# Patient Record
Sex: Male | Born: 1970 | Hispanic: Yes | Marital: Single | Smoking: Never smoker
Health system: Southern US, Community
[De-identification: ages and names within clinical notes are randomized; demographics above are authoritative.]

## PROBLEM LIST (undated history)

## (undated) DIAGNOSIS — F3189 Other bipolar disorder: Secondary | ICD-10-CM

## (undated) DIAGNOSIS — E119 Type 2 diabetes mellitus without complications: Secondary | ICD-10-CM

## (undated) DIAGNOSIS — E785 Hyperlipidemia, unspecified: Secondary | ICD-10-CM

## (undated) DIAGNOSIS — I10 Essential (primary) hypertension: Secondary | ICD-10-CM

## (undated) DIAGNOSIS — R569 Unspecified convulsions: Secondary | ICD-10-CM

## (undated) DIAGNOSIS — F319 Bipolar disorder, unspecified: Secondary | ICD-10-CM

## (undated) DIAGNOSIS — F23 Brief psychotic disorder: Secondary | ICD-10-CM

## (undated) HISTORY — PX: JOINT REPLACEMENT: SHX530

---

## 2017-04-13 ENCOUNTER — Emergency Department
Admission: EM | Admit: 2017-04-13 | Discharge: 2017-04-14 | Disposition: A | Discharge status: Home / Self Care | Payer: Medicare Other | Attending: Emergency Medicine | Admitting: Emergency Medicine

## 2017-04-13 DIAGNOSIS — F319 Bipolar disorder, unspecified: Secondary | ICD-10-CM | POA: Diagnosis not present

## 2017-04-13 DIAGNOSIS — R45851 Suicidal ideations: Secondary | ICD-10-CM

## 2017-04-13 DIAGNOSIS — Z79899 Other long term (current) drug therapy: Secondary | ICD-10-CM | POA: Diagnosis not present

## 2017-04-13 DIAGNOSIS — Z794 Long term (current) use of insulin: Secondary | ICD-10-CM | POA: Insufficient documentation

## 2017-04-13 DIAGNOSIS — E119 Type 2 diabetes mellitus without complications: Secondary | ICD-10-CM | POA: Insufficient documentation

## 2017-04-13 DIAGNOSIS — I10 Essential (primary) hypertension: Secondary | ICD-10-CM | POA: Diagnosis not present

## 2017-04-13 DIAGNOSIS — E871 Hypo-osmolality and hyponatremia: Secondary | ICD-10-CM | POA: Insufficient documentation

## 2017-04-13 HISTORY — DX: Essential (primary) hypertension: I10

## 2017-04-13 HISTORY — DX: Type 2 diabetes mellitus without complications: E11.9

## 2017-04-13 HISTORY — DX: Hyperlipidemia, unspecified: E78.5

## 2017-04-13 HISTORY — DX: Other bipolar disorder: F31.89

## 2017-04-13 HISTORY — DX: Brief psychotic disorder: F23

## 2017-04-13 HISTORY — DX: Bipolar disorder, unspecified: F31.9

## 2017-04-13 LAB — URINE DRUG SCREEN, QUALITATIVE (ARMC ONLY)
AMPHETAMINES, UR SCREEN: NOT DETECTED
BENZODIAZEPINE, UR SCRN: POSITIVE — AB
Barbiturates, Ur Screen: NOT DETECTED
CANNABINOID 50 NG, UR ~~LOC~~: NOT DETECTED
Cocaine Metabolite,Ur ~~LOC~~: NOT DETECTED
MDMA (ECSTASY) UR SCREEN: NOT DETECTED
Methadone Scn, Ur: NOT DETECTED
Opiate, Ur Screen: NOT DETECTED
PHENCYCLIDINE (PCP) UR S: NOT DETECTED
TRICYCLIC, UR SCREEN: NOT DETECTED

## 2017-04-13 LAB — CBC
HCT: 34.8 % — ABNORMAL LOW (ref 40.0–52.0)
Hemoglobin: 12.5 g/dL — ABNORMAL LOW (ref 13.0–18.0)
MCH: 30.4 pg (ref 26.0–34.0)
MCHC: 35.9 g/dL (ref 32.0–36.0)
MCV: 84.7 fL (ref 80.0–100.0)
PLATELETS: 171 10*3/uL (ref 150–440)
RBC: 4.11 MIL/uL — AB (ref 4.40–5.90)
RDW: 17 % — AB (ref 11.5–14.5)
WBC: 7.6 10*3/uL (ref 3.8–10.6)

## 2017-04-13 LAB — ACETAMINOPHEN LEVEL

## 2017-04-13 LAB — COMPREHENSIVE METABOLIC PANEL
ALT: 10 U/L — ABNORMAL LOW (ref 17–63)
ANION GAP: 9 (ref 5–15)
AST: 17 U/L (ref 15–41)
Albumin: 4.2 g/dL (ref 3.5–5.0)
Alkaline Phosphatase: 64 U/L (ref 38–126)
BILIRUBIN TOTAL: 0.5 mg/dL (ref 0.3–1.2)
BUN: 12 mg/dL (ref 6–20)
CHLORIDE: 95 mmol/L — AB (ref 101–111)
CO2: 24 mmol/L (ref 22–32)
Calcium: 9.5 mg/dL (ref 8.9–10.3)
Creatinine, Ser: 0.96 mg/dL (ref 0.61–1.24)
Glucose, Bld: 186 mg/dL — ABNORMAL HIGH (ref 65–99)
POTASSIUM: 4.3 mmol/L (ref 3.5–5.1)
Sodium: 128 mmol/L — ABNORMAL LOW (ref 135–145)
TOTAL PROTEIN: 8 g/dL (ref 6.5–8.1)

## 2017-04-13 LAB — SALICYLATE LEVEL

## 2017-04-13 LAB — ETHANOL: Alcohol, Ethyl (B): <5 mg/dL (ref <5)

## 2017-04-13 MED ORDER — BENZTROPINE MESYLATE 0.5 MG PO TABS
2.0000 mg | ORAL_TABLET | Freq: Two times a day (BID) | ORAL | Status: DC
  Administered 2017-04-13: 2 mg via ORAL
  Filled 2017-04-13: qty 4

## 2017-04-13 MED ORDER — DIVALPROEX SODIUM 500 MG PO DR TAB
2000.0000 mg | DELAYED_RELEASE_TABLET | Freq: Every day | ORAL | Status: DC
  Administered 2017-04-13: 2000 mg via ORAL
  Filled 2017-04-13: qty 4

## 2017-04-13 MED ORDER — AMLODIPINE BESYLATE 5 MG PO TABS: 10.0000 mg | ORAL_TABLET | Freq: Every day | ORAL | Status: DC

## 2017-04-13 MED ORDER — PROPRANOLOL HCL 20 MG PO TABS
10.0000 mg | ORAL_TABLET | Freq: Two times a day (BID) | ORAL | Status: DC
  Administered 2017-04-13: 10 mg via ORAL
  Filled 2017-04-13: qty 1

## 2017-04-13 MED ORDER — INSULIN DETEMIR 100 UNIT/ML ~~LOC~~ SOLN: 28.0000 [IU] | Freq: Every day | SUBCUTANEOUS | Status: DC

## 2017-04-13 MED ORDER — LORAZEPAM 1 MG PO TABS: 1.0000 mg | ORAL_TABLET | Freq: Every day | ORAL | Status: DC | PRN

## 2017-04-13 MED ORDER — LISINOPRIL 10 MG PO TABS: 20.0000 mg | ORAL_TABLET | Freq: Every day | ORAL | Status: DC

## 2017-04-13 MED ORDER — SODIUM CHLORIDE 0.9 % IV BOLUS (SEPSIS): 1000.0000 mL | Freq: Once | INTRAVENOUS | Status: DC

## 2017-04-13 MED ORDER — VITAMIN D 1000 UNITS PO TABS: 2000.0000 [IU] | ORAL_TABLET | Freq: Every day | ORAL | Status: DC

## 2017-04-13 MED ORDER — LORAZEPAM 2 MG PO TABS
2.0000 mg | ORAL_TABLET | Freq: Two times a day (BID) | ORAL | Status: DC
  Administered 2017-04-13: 2 mg via ORAL
  Filled 2017-04-13: qty 1

## 2017-04-13 MED ORDER — LACTULOSE 10 GM/15ML PO SOLN: 20.0000 g | Freq: Every day | ORAL | Status: DC

## 2017-04-13 MED ORDER — LEVETIRACETAM 500 MG PO TABS
1000.0000 mg | ORAL_TABLET | Freq: Every day | ORAL | Status: DC
  Administered 2017-04-13: 1000 mg via ORAL
  Filled 2017-04-13: qty 2

## 2017-04-13 MED ORDER — PANTOPRAZOLE SODIUM 40 MG PO TBEC: 40.0000 mg | DELAYED_RELEASE_TABLET | Freq: Every day | ORAL | Status: DC

## 2017-04-13 MED ORDER — ZIPRASIDONE HCL 80 MG PO CAPS: 80.0000 mg | ORAL_CAPSULE | Freq: Two times a day (BID) | ORAL | Status: DC

## 2017-04-13 NOTE — ED Triage Notes (Signed)
Patient c/o SI. Pt reports plan to hang himself with his belt. Pt reports hx of schizophrenia, bipolar.

## 2017-04-13 NOTE — ED Notes (Signed)
Thahn, ed tech sitting with pt at this time.

## 2017-04-13 NOTE — ED Notes (Signed)
Pt is refusing iv and ivf. Pt will not allow rn to touch arm. Dr. Shaune PollackLord notified, no new orders received.

## 2017-04-13 NOTE — BH Assessment (Signed)
Assessment Note  Oscar Duke is an 46 y.o. male, with a history of bipolar disorder and schizophrenia, presenting to the ED voluntarily with concerns of suicidal ideations.  Pt reports having thoughts of suicide because his caregiver said something to him about saying prayer over his food.  Pt reports he has always been taught to say his prayer over his food.  Pt currently denies SI while in the the ED.    Pt reports he was just transferred from a facility in [place] to [facility] in [place].  He states he does not like the facility because the people are mean and the facility is dirty.  He states he has "been in the system long enough to know what to say to get what he wants".  Pt denies SI/HI and any auditory/visual hallucinations.  He denies any drug/alcohol use.  Diagnosis: Schizophrenia  Past Medical History:  Past Medical History:  Diagnosis Date  . Bipolar 1 disorder (HCC)   . Diabetes mellitus without complication (HCC)   . Hyperlipidemia   . Hypertension   . 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 Schizophrenia, acute     Past Surgical History:  Procedure Laterality Date  . JOINT REPLACEMENT      Family History: No family history on file.  Social History:  reports that he has never smoked. He has never used smokeless tobacco. He reports that he uses drugs, including Marijuana. He reports that he does not drink alcohol.  Additional Social History:  Alcohol / Drug Use Pain Medications: See PTA Prescriptions: See PTA Over the Counter: See PTA History of alcohol / drug use?: No history of alcohol / drug abuse  CIWA: CIWA-Ar BP: (!) 155/90 Pulse Rate: 90 COWS:    Allergies:  Allergies  Allergen Reactions  . Haldol [Haloperidol Lactate]   . Inderal [Propranolol]   . Lithium     Home Medications:  (Not in a hospital admission)  OB/GYN Status:  No LMP for male patient.  General Assessment Data [facility]  Assessment: In system Is this a Tele or Face-to-Face Assessment?: Face-to-Face Is this an Initial Assessment or a Re-assessment for this encounter?: Initial Assessment Marital status: Single Maiden name: na Is patient pregnant?: No Pregnancy Status: No Living Arrangements: Group Home ([facility]) Can pt return to current living arrangement?: Yes Admission Status: Voluntary Is patient capable of signing voluntary admission?: Yes Referral Source: Self/Family/Friend Insurance type: Medicaid     Crisis Care Plan Living Arrangements: Group Home ([facility]) Legal Guardian: Other: (self) Name of Psychiatrist: [facility] Name of Therapist: [facility]  Education Status Is patient currently in school?: No Current Grade: na Highest grade of school patient has completed: na Name of school: na Contact person: na  Risk to self with the past 6 months Suicidal Ideation: Yes-Currently Present Has patient been a risk to self within the past 6 months prior to admission? : No Suicidal Intent: No Has patient had any suicidal intent within the past 6 months prior to admission? : No Is patient at risk for suicide?: No Suicidal Plan?: Yes-Currently Present Has patient had any suicidal plan within the past 6 months prior to admission? : No Specify Current Suicidal Plan: Pt reports thoughts of hanging self with a belt Access to Means: Yes Specify Access to Suicidal Means: Pt reports access to a belt What has been your use of drugs/alcohol within the last 12 months?: Pt denies drug/alcohol use Previous Attempts/Gestures: No Other Self  Harm Risks: none identified Triggers for Past Attempts: None known Intentional Self Injurious Behavior: None Family Suicide History: No Recent stressful life event(s): Other (Comment) (Pt does not like his group home) Persecutory voices/beliefs?: No Depression: No Substance abuse history and/or treatment for substance abuse?: No Suicide prevention information given  to non-admitted patients: Not applicable  Risk to Others within the past 6 months Homicidal Ideation: No Does patient have any lifetime risk of violence toward others beyond the six months prior to admission? : No Thoughts of Harm to Others: No Current Homicidal Intent: No Current Homicidal Plan: No Access to Homicidal Means: No Identified Victim: none identified History of harm to others?: No Assessment of Violence: None Noted Violent Behavior Description: none noted Does patient have access to weapons?: No Criminal Charges Pending?: No Does patient have a court date: No Is patient on probation?: No  Psychosis Hallucinations: None noted Delusions: None noted  Mental Status Report Appearance/Hygiene: In scrubs Eye Contact: Good Motor Activity: Freedom of movement Speech: Logical/coherent Level of Consciousness: Alert Mood: Pleasant Affect: Appropriate to circumstance Anxiety Level: None Thought Processes: Relevant Judgement: Unimpaired Orientation: Person, Place, Time, Situation Obsessive Compulsive Thoughts/Behaviors: Minimal  Cognitive Functioning Concentration: Good Memory: Recent Intact, Remote Intact IQ: Average Insight: Fair Impulse Control: Fair Appetite: Good Weight Loss: 0 Weight Gain: 0 Sleep: No Change Vegetative Symptoms: None  ADLScreening [facility] Assessment Services) Patient's cognitive ability adequate to safely complete daily activities?: Yes Patient able to express need for assistance with ADLs?: Yes Independently performs ADLs?: Yes (appropriate for developmental age)  Prior Inpatient Therapy Prior Inpatient Therapy: Yes Prior Therapy Dates: 03/2017 Prior Therapy Facilty/Provider(s): [facility] Reason for Treatment: schizophrenia  Prior Outpatient Therapy Prior Outpatient Therapy: No Prior Therapy Dates: na Prior Therapy Facilty/Provider(s): na Reason for Treatment: na Does patient have an ACCT team?: Yes Does patient have Intensive  In-House Services?  : No Does patient have [facility] services? : No Does patient have P4CC services?: No  ADL Screening (condition at time of admission) Patient's cognitive ability adequate to safely complete daily activities?: Yes Patient able to express need for assistance with ADLs?: Yes Independently performs ADLs?: Yes (appropriate for developmental age)       Abuse/Neglect Assessment (Assessment to be complete while patient is alone) Physical Abuse: Denies Verbal Abuse: Denies Sexual Abuse: Denies Exploitation of patient/patient's resources: Denies Self-Neglect: Denies Values / Beliefs Cultural Requests During Hospitalization: None Spiritual Requests During Hospitalization: None Consults Spiritual Care Consult Needed: No Social Work Consult Needed: No Merchant navy officer (For Healthcare) Does Patient Have a Medical Advance Directive?: No    Additional Information 1:1 In Past 12 Months?: No CIRT Risk: No Elopement Risk: No Does patient have medical clearance?: Yes     Disposition:  Disposition Initial Assessment Completed for this Encounter: Yes Disposition of Patient: Other dispositions Other disposition(s): Other (Comment) (Pending [facility] consult)  On Site Evaluation by:   Reviewed with Physician:    [place] 04/13/2017 10:35 PM

## 2017-04-13 NOTE — ED Notes (Signed)
TTS in room at this time interviewing.

## 2017-04-13 NOTE — ED Notes (Addendum)
Officer moses with [facility] sitting with pt as SI sitter at this time, relieving Thahn.

## 2017-04-13 NOTE — ED Provider Notes (Signed)
[facility] Emergency Department Provider Note ____________________________________________   I have reviewed the triage vital signs and the triage nursing note.  HISTORY  Chief Complaint Suicidal   Historian Patient  HPI Oscar Duke is a 46 y.o. male with a history of schizophrenia, manic affective disorder, bipolar listed under a diagnosis history, as well as diabetes and hypertension, presents today reporting suicidal ideation. States that he doesn't like where he is living at this group home. He is from [facility]. He is a group home here now. States that he is unhappy there. States that he had a plan to hang himself with a belt. He states told this to the triage nurse that he was still feeling actively suicidal.  To me he states that at the moment he is not feeling actively suicidal but he did have the thoughts earlier.    Past Medical History:  Diagnosis Date  . Bipolar 1 disorder (HCC)   . Diabetes mellitus without complication (HCC)   . Hyperlipidemia   . Hypertension   . 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 Schizophrenia, acute     There are no active problems to display for this patient.   Past Surgical History:  Procedure Laterality Date  . JOINT REPLACEMENT      Prior to Admission medications   Medication Sig Start Date End Date Taking? Authorizing Provider  amLODipine (NORVASC) 10 MG tablet Take 10 mg by mouth daily.   Yes [provider]  benztropine (COGENTIN) 2 MG tablet Take 2 mg by mouth 2 (two) times daily.   Yes [provider]  Cholecalciferol (VITAMIN D3) 2000 units TABS Take by mouth daily.   Yes [provider]  insulin detemir (LEVEMIR) 100 UNIT/ML injection Inject 28 Units into the skin daily.   Yes [provider]  Lactulose 20 GM/30ML SOLN Take 30 mLs by mouth daily.   Yes [provider]  lisinopril (PRINIVIL,ZESTRIL) 20 MG tablet Take 20 mg by  mouth daily.   Yes [provider]  LORazepam (ATIVAN) 2 MG tablet Take 2 mg by mouth 2 (two) times daily.   Yes [provider]  omeprazole (PRILOSEC) 20 MG capsule Take 20 mg by mouth daily.   Yes [provider]  propranolol (INDERAL) 10 MG tablet Take 10 mg by mouth 2 (two) times daily.   Yes [provider]  ziprasidone (GEODON) 80 MG capsule Take 80 mg by mouth 2 (two) times daily with a meal.   Yes [provider]    Allergies  Allergen Reactions  . Haldol [Haloperidol Lactate]   . Inderal [Propranolol]   . Lithium     No family history on file.  Social History Social History  Substance Use Topics  . Smoking status: Never Smoker  . Smokeless tobacco: Never Used  . Alcohol use No    Review of Systems  Constitutional: Negative for fever. Eyes: Negative for visual changes. ENT: Negative for sore throat. Cardiovascular: Negative for chest pain. Respiratory: Negative for shortness of breath. Gastrointestinal: Negative for abdominal pain, vomiting and diarrhea. Genitourinary: Negative for dysuria. Musculoskeletal: Negative for back pain. Skin: Negative for rash. Neurological: Negative for headache.  ____________________________________________   PHYSICAL EXAM:  VITAL SIGNS: ED Triage Vitals [04/13/17 1920]  Enc Vitals Group     BP (!) 148/80     Pulse Rate (!) 103     Resp 18     Temp 98.9 F (37.2 C)  Temp Source Oral     SpO2 100 %     Weight 270 lb (122.5 kg)     Height 6\' 5"  (1.956 m)     Head Circumference      Peak Flow      Pain Score      Pain Loc      Pain Edu?      Excl. in GC?      Constitutional: Alert and oriented. Well appearing and in no distress.  Flat affect. HEENT   Head: Normocephalic and atraumatic.      Eyes: Conjunctivae are normal. Pupils equal and round.       Ears:         Nose: No congestion/rhinnorhea.   Mouth/Throat: Mucous membranes are moist.   Neck: No  stridor. Cardiovascular/Chest: Normal rate, regular rhythm.  No murmurs, rubs, or gallops. Respiratory: Normal respiratory effort without tachypnea nor retractions. Breath sounds are clear and equal bilaterally. No wheezes/rales/rhonchi. Gastrointestinal: Soft. No distention, no guarding, no rebound. Nontender. Genitourinary/rectal:Deferred Musculoskeletal: Nontender with normal range of motion in all extremities. No joint effusions.  No lower extremity tenderness.  No edema. Neurologic:  Normal speech and language. No gross or focal neurologic deficits are appreciated. Skin:  Skin is warm, dry and intact. No rash noted. Psychiatric: Flat affect but cooperative. No agitation. Reports suicidal ideation, but denies active suicidal ideation in the emergency department right now.   ____________________________________________  LABS (pertinent positives/negatives)  Labs Reviewed  COMPREHENSIVE METABOLIC PANEL - Abnormal; Notable for the following:       Result Value   Sodium 128 (*)    Chloride 95 (*)    Glucose, Bld 186 (*)    ALT 10 (*)    All other components within normal limits  ACETAMINOPHEN LEVEL - Abnormal; Notable for the following:    Acetaminophen (Tylenol), Serum <10 (*)    All other components within normal limits  CBC - Abnormal; Notable for the following:    RBC 4.11 (*)    Hemoglobin 12.5 (*)    HCT 34.8 (*)    RDW 17.0 (*)    All other components within normal limits  URINE DRUG SCREEN, QUALITATIVE ([facility] ONLY) - Abnormal; Notable for the following:    Benzodiazepine, Ur Scrn POSITIVE (*)    All other components within normal limits  ETHANOL  SALICYLATE LEVEL    ____________________________________________    EKG I, Governor Rooksebecca Lindzie Boxx, MD, the attending physician have personally viewed and interpreted all ECGs.  None ____________________________________________  RADIOLOGY All Xrays were viewed by me. Imaging interpreted by  Radiologist.  None __________________________________________  PROCEDURES  Procedure(s) performed: None  Critical Care performed: None  ____________________________________________   ED COURSE / ASSESSMENT AND PLAN  Pertinent labs & imaging results that were available during my care of the patient were reviewed by me and considered in my medical decision making (see chart for details).   Mr. Felipa FurnaceGarcia came in reporting suicidal ideation, patient was placed on involuntary commitment regarding these statements although here he is pleasant and interactive and states he is unhappy at his group home. I don't think he needs one-on-one sitter at this point, he has told myself and the nurse that he feels comfortable here and is not feeling actively suicidal.  Patient care transferred to ED physician Dr. Darnelle CatalanMalinda at shift change 11 PM. Psychiatry consult/dispo is pending.    CONSULTATIONS:   Tele- psychiatry as well as TTS.   Patient / Family / Caregiver informed of  clinical course, medical decision-making process, and agree with plan.  ___________________________________________   FINAL CLINICAL IMPRESSION(S) / ED DIAGNOSES   Final diagnoses:  Hyponatremia  Suicidal thoughts              Note: This dictation was prepared with Dragon dictation. Any transcriptional errors that result from this process are unintentional    Governor Rooks, MD 04/13/17 2322

## 2017-04-13 NOTE — ED Notes (Signed)
Pt states "i don't want to do that" when informed his sodium in blood is low and he will need ivf to increase sodium level. Pt informed low sodium "can cause things to happen in your brain and can be harmful". Pt states "i don't want to die at 54forty six years old, I don't want that." pt informed he stated he was suicidal in triage, pt then states "i don't want to talk about what I said". Pt continues to laugh and smile with staff. Pt listening to music from tv in no acute distress.

## 2017-04-13 NOTE — ED Notes (Signed)
Thahn, ed tech sitting with pt as SI sitter.

## 2017-04-14 DIAGNOSIS — F319 Bipolar disorder, unspecified: Secondary | ICD-10-CM | POA: Diagnosis not present

## 2017-04-14 LAB — BASIC METABOLIC PANEL
ANION GAP: 7 (ref 5–15)
BUN: 13 mg/dL (ref 6–20)
CHLORIDE: 98 mmol/L — AB (ref 101–111)
CO2: 25 mmol/L (ref 22–32)
Calcium: 9.5 mg/dL (ref 8.9–10.3)
Creatinine, Ser: 0.89 mg/dL (ref 0.61–1.24)
GFR calc non Af Amer: >60 mL/min (ref >60)
GLUCOSE: 202 mg/dL — AB (ref 65–99)
POTASSIUM: 3.9 mmol/L (ref 3.5–5.1)
Sodium: 130 mmol/L — ABNORMAL LOW (ref 135–145)

## 2017-04-14 MED ORDER — SODIUM CHLORIDE 1 G PO TABS
1.0000 g | ORAL_TABLET | Freq: Once | ORAL | Status: AC
  Administered 2017-04-14: 1 g via ORAL
  Filled 2017-04-14: qty 1

## 2017-04-14 NOTE — ED Notes (Signed)
Call placed to [facility], spoke with [place] who states will send someone to pick pt up.

## 2017-04-14 NOTE — ED Provider Notes (Signed)
Patient is discharged by psychiatry. His sodium has come up we will let him go and follow-up with case management and possibly [facility] as well.   [name], [name], MD 04/14/17 [phone]

## 2017-04-14 NOTE — ED Notes (Signed)
Dr. Darnelle Catalanmalinda in to speak with pt regarding sodium level and treatment plan.

## 2017-04-14 NOTE — Discharge Instructions (Signed)
Decrease her intake of water somewhat. Please follow-up with your doctor. Psychiatry recommends case management to address the patient's psychosocial problems and promote coping skills to decrease his growing dependence for hospital services for nonclinical reasons. Please to return if needed. He may benefit from following up with [facility] as well.

## 2017-04-14 NOTE — ED Notes (Signed)
Pt sitting with SI sitter, watching tv.

## 2017-04-14 NOTE — ED Notes (Signed)
Pt up to restroom to void.  

## 2017-04-15 ENCOUNTER — Encounter: Payer: Self-pay | Admitting: Podiatry

## 2017-04-15 ENCOUNTER — Ambulatory Visit (INDEPENDENT_AMBULATORY_CARE_PROVIDER_SITE_OTHER): Payer: Medicare Other | Admitting: Podiatry

## 2017-04-15 VITALS — BP 151/107 | HR 82

## 2017-04-15 DIAGNOSIS — M79676 Pain in unspecified toe(s): Secondary | ICD-10-CM | POA: Diagnosis not present

## 2017-04-15 DIAGNOSIS — B351 Tinea unguium: Secondary | ICD-10-CM

## 2017-04-15 NOTE — Progress Notes (Signed)
   Subjective:    Patient ID: Oscar Duke, male    DOB: Sep 18, 1971, 46 y.o.   MRN: [id number]  HPI this patient presents the office with chief complaint of long thick painful nails. Patient states the nails are painful walking and wearing his shoes. This patient is diabetic and on insulin. He presents the office today for an evaluation and treatment of his diabetic feet    Review of Systems  All other systems reviewed and are negative.      Objective:   Physical Exam GENERAL APPEARANCE: Alert, conversant. Appropriately groomed. No acute distress.  VASCULAR: Pedal pulses are  palpable at  [facility] bilateral.  Capillary refill time is immediate to all digits,  Normal temperature gradient.  Digital hair growth is present bilateral  NEUROLOGIC: sensation is diminished  to 5.07 monofilament at 5/5 sites bilateral.  Light touch is intact bilateral, Muscle strength normal.  MUSCULOSKELETAL: acceptable muscle strength, tone and stability bilateral.  Intrinsic muscluature intact bilateral.  Rectus appearance of foot and digits noted bilateral.  NAILS  Thick disfigured discolored nails both feet. DERMATOLOGIC: skin color, texture, and turgor are within normal limits.  No preulcerative lesions or ulcers  are seen, no interdigital maceration noted.  No open lesions present.   No drainage noted.         Assessment & Plan:  Onychomycosis  Diabetes with neuropathy.  IE  Debride nails.  Diabetic foot exam was performed.  RTC 3 months    Helane GuntherGregory Moxie Kalil DPM

## 2017-05-14 ENCOUNTER — Emergency Department
Admission: EM | Admit: 2017-05-14 | Discharge: 2017-05-14 | Disposition: A | Discharge status: Home / Self Care | Payer: Medicare Other | Attending: Emergency Medicine | Admitting: Emergency Medicine

## 2017-05-14 ENCOUNTER — Encounter: Payer: Self-pay | Admitting: Emergency Medicine

## 2017-05-14 DIAGNOSIS — F209 Schizophrenia, unspecified: Secondary | ICD-10-CM | POA: Diagnosis not present

## 2017-05-14 DIAGNOSIS — F319 Bipolar disorder, unspecified: Secondary | ICD-10-CM

## 2017-05-14 DIAGNOSIS — I10 Essential (primary) hypertension: Secondary | ICD-10-CM | POA: Insufficient documentation

## 2017-05-14 DIAGNOSIS — E119 Type 2 diabetes mellitus without complications: Secondary | ICD-10-CM | POA: Insufficient documentation

## 2017-05-14 DIAGNOSIS — R45851 Suicidal ideations: Secondary | ICD-10-CM | POA: Diagnosis present

## 2017-05-14 DIAGNOSIS — Z79899 Other long term (current) drug therapy: Secondary | ICD-10-CM | POA: Diagnosis not present

## 2017-05-14 DIAGNOSIS — F23 Brief psychotic disorder: Secondary | ICD-10-CM

## 2017-05-14 DIAGNOSIS — F3110 Bipolar disorder, current episode manic without psychotic features, unspecified: Secondary | ICD-10-CM | POA: Insufficient documentation

## 2017-05-14 DIAGNOSIS — Z794 Long term (current) use of insulin: Secondary | ICD-10-CM | POA: Diagnosis not present

## 2017-05-14 LAB — COMPREHENSIVE METABOLIC PANEL
ALBUMIN: 4.3 g/dL (ref 3.5–5.0)
ALT: 11 U/L — AB (ref 17–63)
AST: 23 U/L (ref 15–41)
Alkaline Phosphatase: 52 U/L (ref 38–126)
Anion gap: 7 (ref 5–15)
BUN: 12 mg/dL (ref 6–20)
CHLORIDE: 97 mmol/L — AB (ref 101–111)
CO2: 25 mmol/L (ref 22–32)
CREATININE: 0.97 mg/dL (ref 0.61–1.24)
Calcium: 9.4 mg/dL (ref 8.9–10.3)
GFR calc non Af Amer: >60 mL/min (ref >60)
Glucose, Bld: 121 mg/dL — ABNORMAL HIGH (ref 65–99)
Potassium: 4.3 mmol/L (ref 3.5–5.1)
SODIUM: 129 mmol/L — AB (ref 135–145)
Total Bilirubin: 0.6 mg/dL (ref 0.3–1.2)
Total Protein: 8 g/dL (ref 6.5–8.1)

## 2017-05-14 LAB — CBC
HEMATOCRIT: 35.3 % — AB (ref 40.0–52.0)
HEMOGLOBIN: 12.4 g/dL — AB (ref 13.0–18.0)
MCH: 30.5 pg (ref 26.0–34.0)
MCHC: 35.2 g/dL (ref 32.0–36.0)
MCV: 86.6 fL (ref 80.0–100.0)
Platelets: 155 10*3/uL (ref 150–440)
RBC: 4.07 MIL/uL — ABNORMAL LOW (ref 4.40–5.90)
RDW: 16.2 % — ABNORMAL HIGH (ref 11.5–14.5)
WBC: 7.8 10*3/uL (ref 3.8–10.6)

## 2017-05-14 LAB — ACETAMINOPHEN LEVEL

## 2017-05-14 LAB — SALICYLATE LEVEL: Salicylate Lvl: <7 mg/dL (ref 2.8–30.0)

## 2017-05-14 NOTE — ED Triage Notes (Signed)
Pt states that he has belts, more than one to use.

## 2017-05-14 NOTE — Discharge Instructions (Addendum)
Return to the emergency department immediately for any worsening condition including any worsening depression or thoughts of wanting to hurt herself or others.

## 2017-05-14 NOTE — ED Notes (Signed)
Pt in community room socializing with peers.

## 2017-05-14 NOTE — ED Notes (Addendum)
All belongings including clothes, sunglasses, hat, sandals, phone in pt bag. Brought to bag and placed at nursing station after dress out. Yellow colored necklace also in bag.

## 2017-05-14 NOTE — ED Notes (Signed)
He arrives today from [facility]  [address]  [place]  [place]   Pt reports not being able to sleep over the last two night  "i was taking a shower at 0300 this morning - at least I am bathing - when I get depressed I do not bathe. - I ain't slept in 2 days  - I am suicidal and I hear the voices but I am a [place] and I know I will go to hell if I hurt myself."

## 2017-05-14 NOTE — ED Provider Notes (Signed)
[facility] Emergency Department Provider Note ____________________________________________   I have reviewed the triage vital signs and the triage nursing note.  HISTORY  Chief Complaint Suicidal   Historian Patient  HPI Oscar Duke is a 46 y.o. male with a history of bipolar and schizophrenia as well as diabetes hypertension, lives at a group home, states that he had called the number on his [place] crisis car because she is having vague suicidal thoughts this morning and he is no longer in [place] and so he states he just decided to tear up and shredded the cart and throat away. He states his group home owner then called for him to be sent to the ED for further evaluation. Patient states he no longer feels suicidal. He states that he will cry like a baby if he thinks about his mom who died last year.    Past Medical History:  Diagnosis Date  . Bipolar 1 disorder (HCC)   . Diabetes mellitus without complication (HCC)   . Hyperlipidemia   . Hypertension   . 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 Schizophrenia, acute     There are no active problems to display for this patient.   Past Surgical History:  Procedure Laterality Date  . JOINT REPLACEMENT      Prior to Admission medications   Medication Sig Start Date End Date Taking? Authorizing Provider  amLODipine (NORVASC) 10 MG tablet Take 10 mg by mouth daily.   Yes [provider]  benztropine (COGENTIN) 2 MG tablet Take 2 mg by mouth 2 (two) times daily.   Yes [provider]  Cholecalciferol (VITAMIN D3) 2000 units TABS Take 2,000 Units by mouth daily.    Yes [provider]  divalproex (DEPAKOTE) 500 MG DR tablet Take 2,000 mg by mouth at bedtime.   Yes [provider]  insulin detemir (LEVEMIR) 100 UNIT/ML injection Inject 28 Units into the skin daily.   Yes [provider]  Lactulose 20 GM/30ML SOLN Take 30 mLs by mouth  daily.   Yes [provider]  levETIRAcetam (KEPPRA) 1000 MG tablet Take 1,000 mg by mouth at bedtime.   Yes [provider]  lisinopril (PRINIVIL,ZESTRIL) 20 MG tablet Take 20 mg by mouth daily.   Yes [provider]  LORazepam (ATIVAN) 1 MG tablet Take 1 mg by mouth every 8 (eight) hours.   Yes [provider]  LORazepam (ATIVAN) 2 MG tablet Take 2 mg by mouth 2 (two) times daily.   Yes [provider]  omeprazole (PRILOSEC) 20 MG capsule Take 20 mg by mouth daily.   Yes [provider]  propranolol (INDERAL) 10 MG tablet Take 10 mg by mouth 2 (two) times daily.   Yes [provider]  ziprasidone (GEODON) 80 MG capsule Take 80 mg by mouth 2 (two) times daily with a meal.   Yes [provider]    Allergies  Allergen Reactions  . Haldol [Haloperidol Lactate]   . Inderal [Propranolol]   . Lithium     No family history on file.  Social History Social History  Substance Use Topics  . Smoking status: Never Smoker  . Smokeless tobacco: Never Used  . Alcohol use No    Review of Systems  Constitutional: Negative for fever. Eyes: Negative for visual changes. ENT: Negative for sore throat. Cardiovascular: Negative for chest pain. Respiratory: Negative for shortness of breath. Gastrointestinal: Negative for abdominal pain, vomiting and diarrhea. Genitourinary: Negative for  dysuria. Musculoskeletal: Negative for back pain. Skin: Negative for rash. Neurological: Negative for headache.  ____________________________________________   PHYSICAL EXAM:  VITAL SIGNS: ED Triage Vitals  Enc Vitals Group     BP 05/14/17 1141 129/87     Pulse Rate 05/14/17 1141 92     Resp 05/14/17 1141 18     Temp 05/14/17 1141 98.7 F (37.1 C)     Temp Source 05/14/17 1141 Oral     SpO2 05/14/17 1141 98 %     Weight 05/14/17 1141 265 lb (120.2 kg)     Height 05/14/17 1141 6\' 5"  (1.956 m)     Head Circumference --      Peak  Flow --      Pain Score 05/14/17 1140 0     Pain Loc --      Pain Edu? --      Excl. in GC? --      Constitutional: Alert and cooperative. Well appearing and in no distress. HEENT   Head: Normocephalic and atraumatic.      Eyes: Conjunctivae are normal. Pupils equal and round.       Ears:         Nose: No congestion/rhinnorhea.   Mouth/Throat: Mucous membranes are moist.   Neck: No stridor. Cardiovascular/Chest: Normal rate, regular rhythm.  No murmurs, rubs, or gallops. Respiratory: Normal respiratory effort without tachypnea nor retractions. Breath sounds are clear and equal bilaterally. No wheezes/rales/rhonchi. Gastrointestinal: Soft. No distention, no guarding, no rebound. Nontender.    Genitourinary/rectal:Deferred Musculoskeletal: Nontender with normal range of motion in all extremities. No joint effusions.  No lower extremity tenderness.  No edema. Neurologic:  Normal speech and language. No gross or focal neurologic deficits are appreciated. Skin:  Skin is warm, dry and intact. No rash noted. Psychiatric: States that he was having vague suicidal thoughts without a plan this morning, but currently feels fine and not suicidal. Bright affect here. He laughs at times.   ____________________________________________  LABS (pertinent positives/negatives)  Labs Reviewed  COMPREHENSIVE METABOLIC PANEL - Abnormal; Notable for the following:       Result Value   Sodium 129 (*)    Chloride 97 (*)    Glucose, Bld 121 (*)    ALT 11 (*)    All other components within normal limits  ACETAMINOPHEN LEVEL - Abnormal; Notable for the following:    Acetaminophen (Tylenol), Serum <10 (*)    All other components within normal limits  CBC - Abnormal; Notable for the following:    RBC 4.07 (*)    Hemoglobin 12.4 (*)    HCT 35.3 (*)    RDW 16.2 (*)    All other components within normal limits  SALICYLATE LEVEL  URINE DRUG SCREEN, QUALITATIVE ([facility] ONLY)     ____________________________________________    EKG I, Governor Rooks, MD, the attending physician have personally viewed and interpreted all ECGs.  None ____________________________________________  RADIOLOGY All Xrays were viewed by me. Imaging interpreted by Radiologist.  None __________________________________________  PROCEDURES  Procedure(s) performed: None  Critical Care performed: None  ____________________________________________   ED COURSE / ASSESSMENT AND PLAN  Pertinent labs & imaging results that were available during my care of the patient were reviewed by me and considered in my medical decision making (see chart for details).   Mr. Rinker came for evaluation from his group home after vague suicidal thoughts this morning and states that that is gone now and he feels well.  Given his history, I am  going to have TTS and psychiatry evaluate him, and I suspect likely for disposition back to his group home.  OK for discharge after seeing Dr. Toni Amendlapacs.   CONSULTATIONS:  TTS and psychiatry, Dr. Toni Amendlapacs recommends outpatient disposition.   Patient / Family / Caregiver informed of clinical course, medical decision-making process, and agree with plan.   I discussed return precautions, follow-up instructions, and discharge instructions with patient and/or family.  Discharge Instructions : Return to the emergency department immediately for any worsening condition including any worsening depression or thoughts of wanting to hurt herself or others.  ___________________________________________   FINAL CLINICAL IMPRESSION(S) / ED DIAGNOSES   Final diagnoses:  Bipolar 1 disorder (HCC)  Schizophrenia, unspecified type (HCC)  Suicidal thoughts              Note: This dictation was prepared with Dragon dictation. Any transcriptional errors that result from this process are unintentional    Governor RooksLord, Eduarda Scrivens, MD 05/14/17 1529

## 2017-05-14 NOTE — ED Notes (Signed)
BEHAVIORAL HEALTH ROUNDING Patient sleeping: No. Patient alert and oriented: yes Behavior appropriate: Yes.  ; If no, describe:  Nutrition and fluids offered: yes Toileting and hygiene offered: Yes  Sitter present: q15 minute observations and security  monitoring Law enforcement present: Yes  ODS  

## 2017-05-14 NOTE — ED Notes (Signed)
Supper tray given to patient.

## 2017-05-14 NOTE — ED Notes (Signed)
ED Provider at bedside. 

## 2017-05-14 NOTE — Consult Note (Signed)
[facility] Consult   Reason for Consult:  Consult for 46 year old man with a history of chronic mental health issues who asked to be brought to the emergency room "to be evaluated" Referring Physician:  Reita Cliche Patient Identification: Oscar Duke MRN:  [id number] Principal Diagnosis: Schizophrenia, acute Diagnosis:   Patient Active Problem List   Diagnosis Date Noted  . Schizophrenia, acute [F23] 05/14/2017    Total Time spent with patient: 1 hour  Subjective:   Oscar Duke is a 46 y.o. male patient admitted with "I just thought I needed to get an evaluation".  HPI:  Patient interviewed chart reviewed. 46 year old man with a history of chronic mental health problems. He says that he was just feeling restless and called the staff at his group home and asked them to have him brought to the hospital. Patient denies that he's having any suicidal thoughts. Denies any homicidal thoughts. He says his mood feels about the same as usual. He is chronically a little restless and irritable and admits that sometimes he makes threatening statements when he gets angry but denies any actual thought that he will follow through on anything violent. He is compliant with his outpatient medicine. Denies alcohol or drug abuse. In speech is rambling but easily redirectable. Does not report any actual recent change to his mental state or acute dangerousness. Sleep is adequate.  Social history: He is originally from [facility] [place] [facility]. Has only lived in [place] about 2 months. Sounds like he was sent here after a hospitalization. From what he says he's been in hospitals repeatedly for years. He talks about [facility] as though it were his home.  Medical history: Denies any significant medical problems aside from his chronic mental illness  Substance abuse history: Denies alcohol or drug abuse or any past history of substance abuse problems  Past Psychiatric History: Patient says that he faked  a suicide attempt when he was 46 years old but denies any past actual suicide attempts. Admits that he's gotten violent when he was angry in the past. Multiple prior hospitalizations.  Risk to Self: Suicidal Ideation: No-Not Currently/Within Last 6 Months Suicidal Intent: No-Not Currently/Within Last 6 Months Is patient at risk for suicide?: Yes Suicidal Plan?: No-Not Currently/Within Last 6 Months Specify Current Suicidal Plan: Hang Self Access to Means: Yes Specify Access to Suicidal Means: Means to hang self What has been your use of drugs/alcohol within the last 12 months?: Pt denies alcohol/drug use Other Self Harm Risks: None Reported Intentional Self Injurious Behavior: None Risk to Others: Homicidal Ideation: No Thoughts of Harm to Others: No Current Homicidal Intent: No Current Homicidal Plan: No Access to Homicidal Means: No History of harm to others?: No Assessment of Violence: None Noted Does patient have access to weapons?: No Criminal Charges Pending?: No Does patient have a court date: No Prior Inpatient Therapy:   Prior Outpatient Therapy: Prior Outpatient Therapy: Yes Prior Therapy Dates: Ongoing Prior Therapy Facilty/Provider(s): [facility] Reason for Treatment: ACT team Does patient have an ACCT team?: Yes Does patient have Intensive In-House Services?  : No Does patient have [facility] services? : Unknown Does patient have P4CC services?: No  Past Medical History:  Past Medical History:  Diagnosis Date  . Bipolar 1 disorder ([place])   . Diabetes mellitus without complication ([place])   . Hyperlipidemia   . Hypertension   . Manic affective disorder with recurrent episode ([place])   . Schizophrenia, acute     Past Surgical History:  Procedure Laterality Date  .  JOINT REPLACEMENT     Family History: No family history on file. Family Psychiatric  History: Patient is not aware of any family history Social History:  History  Alcohol Use No     History  Drug Use  .  Types: Marijuana    Comment: quit 5 years ago    Social History   Social History  . Marital status: Single    Spouse name: N/A  . Number of children: N/A  . Years of education: N/A   Social History Main Topics  . Smoking status: Never Smoker  . Smokeless tobacco: Never Used  . Alcohol use No  . Drug use: Yes    Types: Marijuana     Comment: quit 5 years ago  . Sexual activity: Not Asked   Other Topics Concern  . None   Social History Narrative  . None   Additional Social History:    Allergies:   Allergies  Allergen Reactions  . Haldol [Haloperidol Lactate]   . Inderal [Propranolol]   . Lithium     Labs:  Results for orders placed or performed during the hospital encounter of 05/14/17 (from the past 48 hour(s))  Comprehensive metabolic panel     Status: Abnormal   Collection Time: 05/14/17 12:00 PM  Result Value Ref Range   Sodium 129 (L) 135 - 145 mmol/L   Potassium 4.3 3.5 - 5.1 mmol/L   Chloride 97 (L) 101 - 111 mmol/L   CO2 25 22 - 32 mmol/L   Glucose, Bld 121 (H) 65 - 99 mg/dL   BUN 12 6 - 20 mg/dL   Creatinine, Ser 0.97 0.61 - 1.24 mg/dL   Calcium 9.4 8.9 - 10.3 mg/dL   Total Protein 8.0 6.5 - 8.1 g/dL   Albumin 4.3 3.5 - 5.0 g/dL   AST 23 15 - 41 U/L   ALT 11 (L) 17 - 63 U/L   Alkaline Phosphatase 52 38 - 126 U/L   Total Bilirubin 0.6 0.3 - 1.2 mg/dL   GFR calc non Af Amer >60 >60 mL/min   GFR calc Af Amer >60 >60 mL/min    Comment: (NOTE) The eGFR has been calculated using the CKD EPI equation. This calculation has not been validated in all clinical situations. eGFR's persistently <60 mL/min signify possible Chronic Kidney Disease.    Anion gap 7 5 - 15  Salicylate level     Status: None   Collection Time: 05/14/17 12:00 PM  Result Value Ref Range   Salicylate Lvl <7.1 2.8 - 30.0 mg/dL  Acetaminophen level     Status: Abnormal   Collection Time: 05/14/17 12:00 PM  Result Value Ref Range   Acetaminophen (Tylenol), Serum <10 (L) 10 - 30  ug/mL    Comment:        THERAPEUTIC CONCENTRATIONS VARY SIGNIFICANTLY. A RANGE OF 10-30 ug/mL MAY BE AN EFFECTIVE CONCENTRATION FOR MANY PATIENTS. HOWEVER, SOME ARE BEST TREATED AT CONCENTRATIONS OUTSIDE THIS RANGE. ACETAMINOPHEN CONCENTRATIONS >150 ug/mL AT 4 HOURS AFTER INGESTION AND >50 ug/mL AT 12 HOURS AFTER INGESTION ARE OFTEN ASSOCIATED WITH TOXIC REACTIONS.   cbc     Status: Abnormal   Collection Time: 05/14/17 12:00 PM  Result Value Ref Range   WBC 7.8 3.8 - 10.6 K/uL   RBC 4.07 (L) 4.40 - 5.90 MIL/uL   Hemoglobin 12.4 (L) 13.0 - 18.0 g/dL   HCT 35.3 (L) 40.0 - 52.0 %   MCV 86.6 80.0 - 100.0 fL   MCH 30.5 26.0 -  34.0 pg   MCHC 35.2 32.0 - 36.0 g/dL   RDW 16.2 (H) 11.5 - 14.5 %   Platelets 155 150 - 440 K/uL    No current facility-administered medications for this encounter.    Current Outpatient Prescriptions  Medication Sig Dispense Refill  . amLODipine (NORVASC) 10 MG tablet Take 10 mg by mouth daily.    . benztropine (COGENTIN) 2 MG tablet Take 2 mg by mouth 2 (two) times daily.    . Cholecalciferol (VITAMIN D3) 2000 units TABS Take 2,000 Units by mouth daily.     . divalproex (DEPAKOTE) 500 MG DR tablet Take 2,000 mg by mouth at bedtime.    . insulin detemir (LEVEMIR) 100 UNIT/ML injection Inject 28 Units into the skin daily.    . Lactulose 20 GM/30ML SOLN Take 30 mLs by mouth daily.    Marland Kitchen levETIRAcetam (KEPPRA) 1000 MG tablet Take 1,000 mg by mouth at bedtime.    Marland Kitchen lisinopril (PRINIVIL,ZESTRIL) 20 MG tablet Take 20 mg by mouth daily.    Marland Kitchen LORazepam (ATIVAN) 1 MG tablet Take 1 mg by mouth every 8 (eight) hours.    Marland Kitchen LORazepam (ATIVAN) 2 MG tablet Take 2 mg by mouth 2 (two) times daily.    Marland Kitchen omeprazole (PRILOSEC) 20 MG capsule Take 20 mg by mouth daily.    . propranolol (INDERAL) 10 MG tablet Take 10 mg by mouth 2 (two) times daily.    . ziprasidone (GEODON) 80 MG capsule Take 80 mg by mouth 2 (two) times daily with a meal.      Musculoskeletal: Strength  & Muscle Tone: within normal limits Gait & Station: normal Patient leans: N/A  Psychiatric Specialty Exam: Physical Exam  Nursing note and vitals reviewed. Constitutional: He appears well-developed and well-nourished.  HENT:  Head: Normocephalic and atraumatic.  Eyes: Conjunctivae are normal. Pupils are equal, round, and reactive to light.  Neck: Normal range of motion.  Cardiovascular: Regular rhythm and normal heart sounds.   Respiratory: Effort normal. No respiratory distress.  GI: Soft.  Musculoskeletal: Normal range of motion.  Neurological: He is alert.  Skin: Skin is warm and dry.  Psychiatric: He has a normal mood and affect. His behavior is normal. Thought content normal. His affect is not blunt. His speech is tangential. Cognition and memory are normal. He expresses impulsivity.    Review of Systems  Constitutional: Negative.   HENT: Negative.   Eyes: Negative.   Respiratory: Negative.   Cardiovascular: Negative.   Gastrointestinal: Negative.   Musculoskeletal: Negative.   Skin: Negative.   Neurological: Negative.   Psychiatric/Behavioral: Positive for hallucinations. Negative for depression, memory loss, substance abuse and suicidal ideas. The patient is not nervous/anxious and does not have insomnia.     Blood pressure 129/87, pulse 92, temperature 98.7 F (37.1 C), temperature source Oral, resp. rate 18, height '6\' 5"'$  (1.956 m), weight 265 lb (120.2 kg), SpO2 98 %.Body mass index is 31.42 kg/m.  General Appearance: Casual  Eye Contact:  Fair  Speech:  Pressured  Volume:  Increased  Mood:  Euthymic  Affect:  Appropriate  Thought Process:  Disorganized  Orientation:  Full (Time, Place, and Person)  Thought Content:  Tangential  Suicidal Thoughts:  No  Homicidal Thoughts:  No  Memory:  Immediate;   Fair Recent;   Fair Remote;   Fair  Judgement:  Fair  Insight:  Fair  Psychomotor Activity:  Restlessness  Concentration:  Concentration: Fair  Recall:  [employer] of Knowledge:  Fair  Language:  Fair  Akathisia:  No  Handed:  Right  AIMS (if indicated):     Assets:  Communication Skills Desire for Improvement Financial Resources/Insurance Housing Physical Health Resilience  ADL's:  Intact  Cognition:  Impaired,  Mild  Sleep:        Treatment Plan Summary: Daily contact with patient to assess and evaluate symptoms and progress in treatment, Medication management and Plan This is a 46 year old man with chronic mental health problems. He is admitting to me today that he just came over to the hospital because he was feeling sort of restlessness and thought maybe he should come over and get "evaluated". He is not able to explain exactly what it is that needs evaluation. Patient comes across as rather childlike and simple. Does have a history of aggression in the past but is calm and cooperative right now. Does not meet commitment criteria and does not need hospital level treatment. Supportive counseling and review plan with patient. He hasn't act team in place which is a blessing. Patient can be released from the emergency room and follow-up in the community. Reviewed with emergency room doctor.  Disposition: No evidence of imminent risk to self or others at present.   Patient does not meet criteria for psychiatric inpatient admission. Supportive therapy provided about ongoing stressors.  Alethia Berthold, MD 05/14/2017 4:35 PM

## 2017-05-14 NOTE — ED Notes (Addendum)

## 2017-05-14 NOTE — ED Triage Notes (Signed)
Pt states, " I am suicidal". "My mom died and I don't want to live any more". Pt reports plans to hang himself.

## 2017-05-14 NOTE — BH Assessment (Signed)
1st TTS consult attempted. Pt unable to concentrate due to high level of activity/noise in quad. Per pt RN (Amy) no ED room available for pt to complete his consult at this time. [facility] RN[facility] Clinician to complete consult upon pt transfer.
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
## 2017-05-14 NOTE — ED Notes (Signed)
Dressed out by this Charity fundraiserN and LES EMP

## 2017-05-14 NOTE — ED Notes (Signed)
Patient transferred from ED. Alert and orient. Reports was suicidal when came to hospital. Had plan to tie 2 belts together. Pt reports he is no longer suicidal, he does not want to go to hell and when you kill yourself you go to hell. Pt denies HI, AVH at present. Reports hallucinations with lack of sleep. Pt reports not sleeping in 2 days. Pt currently in room watching tv in no distress. Fluids offered. Pt cooperative and pleasant at this time.

## 2017-05-14 NOTE — BH Assessment (Signed)
Per Dr.Clapacs pt is recommended for d/c.

## 2017-05-14 NOTE — ED Notes (Signed)
Patient discharged home. DC instructions provided and explained with caregivers. Pt stable at discharge. Denies SI, HI, AVH.

## 2017-07-22 ENCOUNTER — Ambulatory Visit: Payer: Medicaid Other | Admitting: Podiatry

## 2017-08-15 ENCOUNTER — Ambulatory Visit (INDEPENDENT_AMBULATORY_CARE_PROVIDER_SITE_OTHER): Payer: Medicare Other | Admitting: Podiatry

## 2017-08-15 ENCOUNTER — Encounter: Payer: Self-pay | Admitting: Podiatry

## 2017-08-15 DIAGNOSIS — B351 Tinea unguium: Secondary | ICD-10-CM | POA: Diagnosis not present

## 2017-08-15 DIAGNOSIS — M79676 Pain in unspecified toe(s): Secondary | ICD-10-CM

## 2017-08-15 DIAGNOSIS — L603 Nail dystrophy: Secondary | ICD-10-CM

## 2017-08-15 NOTE — Progress Notes (Addendum)
Complaint:  Visit Type: Patient returns to my office for continued preventative foot care services. Complaint: Patient states" my nails have grown long and thick and become painful to walk and wear shoes"  The patient presents for preventative foot care services. No changes to ROS  Podiatric Exam: Vascular: dorsalis pedis and posterior tibial pulses are palpable bilateral. Capillary return is immediate. Temperature gradient is WNL. Skin turgor WNL  Sensorium: Normal Semmes Weinstein monofilament test. Normal tactile sensation bilaterally. Nail Exam: Pt has thick disfigured discolored nails with subungual debris noted bilateral entire nail hallux through fifth toenails.  His right hallux nail plate has pulled away from the proximal nail fold and has become unattached to the nailbed. No signs of redness, swelling or infection or drainage Ulcer Exam: There is no evidence of ulcer or pre-ulcerative changes or infection. Orthopedic Exam: Muscle tone and strength are WNL. No limitations in general ROM. No crepitus or effusions noted. Foot type and digits show no abnormalities. Bony prominences are unremarkable. Skin: No Porokeratosis. No infection or ulcers  Diagnosis:  Onychomycosis, , Pain in right toe, pain in left toes  Treatment & Plan Procedures and Treatment: Consent by patient was obtained for treatment procedures.   Debridement of mycotic and hypertrophic toenails, 1 through 5 bilateral and clearing of subungual debris. No ulceration, no infection noted. Note  ABN given to caregiver to sign. Return Visit-Office Procedure: Patient instructed to return to the office for a follow up visit 3 months for continued evaluation and treatment.    Helane Gunther DPM

## 2017-08-30 ENCOUNTER — Encounter (HOSPITAL_COMMUNITY): Payer: Self-pay | Admitting: Emergency Medicine

## 2017-08-30 ENCOUNTER — Emergency Department (HOSPITAL_COMMUNITY)
Admission: EM | Admit: 2017-08-30 | Discharge: 2017-08-30 | Disposition: A | Discharge status: Home / Self Care | Payer: Medicare Other | Attending: Emergency Medicine | Admitting: Emergency Medicine

## 2017-08-30 DIAGNOSIS — I10 Essential (primary) hypertension: Secondary | ICD-10-CM | POA: Diagnosis not present

## 2017-08-30 DIAGNOSIS — F809 Developmental disorder of speech and language, unspecified: Secondary | ICD-10-CM | POA: Diagnosis not present

## 2017-08-30 DIAGNOSIS — E119 Type 2 diabetes mellitus without complications: Secondary | ICD-10-CM | POA: Insufficient documentation

## 2017-08-30 DIAGNOSIS — E871 Hypo-osmolality and hyponatremia: Secondary | ICD-10-CM | POA: Insufficient documentation

## 2017-08-30 DIAGNOSIS — Z79899 Other long term (current) drug therapy: Secondary | ICD-10-CM | POA: Diagnosis not present

## 2017-08-30 DIAGNOSIS — R1030 Lower abdominal pain, unspecified: Secondary | ICD-10-CM | POA: Diagnosis present

## 2017-08-30 DIAGNOSIS — Z9181 History of falling: Secondary | ICD-10-CM | POA: Insufficient documentation

## 2017-08-30 DIAGNOSIS — F99 Mental disorder, not otherwise specified: Secondary | ICD-10-CM | POA: Diagnosis not present

## 2017-08-30 LAB — BASIC METABOLIC PANEL
ANION GAP: 11 (ref 5–15)
BUN: 15 mg/dL (ref 6–20)
CALCIUM: 9.1 mg/dL (ref 8.9–10.3)
CHLORIDE: 96 mmol/L — AB (ref 101–111)
CO2: 23 mmol/L (ref 22–32)
Creatinine, Ser: 0.93 mg/dL (ref 0.61–1.24)
GFR calc Af Amer: >60 mL/min (ref >60)
GFR calc non Af Amer: >60 mL/min (ref >60)
GLUCOSE: 163 mg/dL — AB (ref 65–99)
POTASSIUM: 3.8 mmol/L (ref 3.5–5.1)
Sodium: 130 mmol/L — ABNORMAL LOW (ref 135–145)

## 2017-08-30 LAB — COMPREHENSIVE METABOLIC PANEL
ALT: 23 U/L (ref 17–63)
ANION GAP: 11 (ref 5–15)
AST: 107 U/L — ABNORMAL HIGH (ref 15–41)
Albumin: 4 g/dL (ref 3.5–5.0)
Alkaline Phosphatase: 41 U/L (ref 38–126)
BUN: 19 mg/dL (ref 6–20)
CALCIUM: 9.3 mg/dL (ref 8.9–10.3)
CHLORIDE: 92 mmol/L — AB (ref 101–111)
CO2: 22 mmol/L (ref 22–32)
CREATININE: 1.04 mg/dL (ref 0.61–1.24)
Glucose, Bld: 106 mg/dL — ABNORMAL HIGH (ref 65–99)
Potassium: 4.2 mmol/L (ref 3.5–5.1)
Sodium: 125 mmol/L — ABNORMAL LOW (ref 135–145)
TOTAL PROTEIN: 7.3 g/dL (ref 6.5–8.1)
Total Bilirubin: 0.7 mg/dL (ref 0.3–1.2)

## 2017-08-30 LAB — CBC WITH DIFFERENTIAL/PLATELET
BASOS ABS: 0 10*3/uL (ref 0.0–0.1)
Basophils Relative: 0 %
Eosinophils Absolute: 0 10*3/uL (ref 0.0–0.7)
Eosinophils Relative: 0 %
HEMATOCRIT: 35.9 % — AB (ref 39.0–52.0)
HEMOGLOBIN: 12.8 g/dL — AB (ref 13.0–17.0)
LYMPHS PCT: 10 %
Lymphs Abs: 1.3 10*3/uL (ref 0.7–4.0)
MCH: 31.1 pg (ref 26.0–34.0)
MCHC: 35.7 g/dL (ref 30.0–36.0)
MCV: 87.3 fL (ref 78.0–100.0)
MONO ABS: 2.3 10*3/uL — AB (ref 0.1–1.0)
MONOS PCT: 18 %
NEUTROS ABS: 9.3 10*3/uL — AB (ref 1.7–7.7)
Neutrophils Relative %: 72 %
Platelets: 109 10*3/uL — ABNORMAL LOW (ref 150–400)
RBC: 4.11 MIL/uL — ABNORMAL LOW (ref 4.22–5.81)
RDW: 13.9 % (ref 11.5–15.5)
WBC: 13 10*3/uL — ABNORMAL HIGH (ref 4.0–10.5)

## 2017-08-30 LAB — VALPROIC ACID LEVEL: VALPROIC ACID LVL: 76 ug/mL (ref 50.0–100.0)

## 2017-08-30 MED ORDER — SODIUM CHLORIDE 0.9 % IV BOLUS (SEPSIS)
1000.0000 mL | Freq: Once | INTRAVENOUS | Status: AC
  Administered 2017-08-30: 1000 mL via INTRAVENOUS

## 2017-08-30 NOTE — ED Notes (Signed)
Pt assisted to BR and provided lunch tray

## 2017-08-30 NOTE — ED Provider Notes (Signed)
[facility] EMERGENCY DEPARTMENT Provider Note   CSN: [id number] Arrival date & time: 08/30/17  0915     History   Chief Complaint Chief Complaint  Patient presents with  . Fall    HPI Oscar Duke is a 46 y.o. male.  HPI  Patient presents from his group home with concern of falls, belly pain.  Patient himself has cognitive impairment, psychiatric disease, level 5 caveat. On initial exam the patient awakened easily, states that he feels tired, describes abdominal pain, but states that he is hungry. Per report patient has had multiple falls recently, though it is unclear if he has had loss of consciousness, or degree of falls, or over the timeframe.   Obtained by nursing staff, nursing home chart.  Past Medical History:  Diagnosis Date  . Bipolar 1 disorder (HCC)   . Diabetes mellitus without complication (HCC)   . Hyperlipidemia   . Hypertension   . 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 Schizophrenia, acute [facility](HCC)     Patient Active Problem List   Diagnosis Date Noted  . Schizophrenia, acute (HCC) 05/14/2017    Past Surgical History:  Procedure Laterality Date  . JOINT REPLACEMENT         Home Medications    Prior to Admission medications   Medication Sig Start Date End Date Taking? Authorizing Provider  amLODipine (NORVASC) 10 MG tablet Take 10 mg by mouth daily.    [provider]  benztropine (COGENTIN) 2 MG tablet Take 2 mg by mouth 2 (two) times daily.    [provider]  Cholecalciferol (VITAMIN D3) 2000 units TABS Take 2,000 Units by mouth daily.     [provider]  divalproex (DEPAKOTE) 500 MG DR tablet Take 2,000 mg by mouth at bedtime.    [provider]  insulin detemir (LEVEMIR) 100 UNIT/ML injection Inject 28 Units into the skin daily.    [provider]  Lactulose 20 GM/30ML SOLN Take 30 mLs by mouth daily.    [provider]  levETIRAcetam (KEPPRA) 1000 MG tablet Take  1,000 mg by mouth at bedtime.    [provider]  lisinopril (PRINIVIL,ZESTRIL) 20 MG tablet Take 20 mg by mouth daily.    [provider]  LORazepam (ATIVAN) 1 MG tablet Take 1 mg by mouth every 8 (eight) hours.    [provider]  LORazepam (ATIVAN) 2 MG tablet Take 2 mg by mouth 2 (two) times daily.    [provider]  omeprazole (PRILOSEC) 20 MG capsule Take 20 mg by mouth daily.    [provider]  propranolol (INDERAL) 10 MG tablet Take 10 mg by mouth 2 (two) times daily.    [provider]  ziprasidone (GEODON) 80 MG capsule Take 80 mg by mouth 2 (two) times daily with a meal.    [provider]    Family History History reviewed. No pertinent family history.  Social History Social History  Substance Use Topics  . Smoking status: Never Smoker  . Smokeless tobacco: Never Used  . Alcohol use No     Allergies   Haldol [haloperidol lactate]; Inderal [propranolol]; and Lithium   Review of Systems Review of Systems  Unable to perform ROS: Psychiatric disorder     Physical Exam Updated Vital Signs BP 109/68 (BP Location: Right Arm)   Pulse 98   Temp 98 F (36.7 C) (Oral)   Resp 20   Ht 6\' 4"  (1.93 m)  Wt 120.2 kg (265 lb)   SpO2 100%   BMI 32.26 kg/m   Physical Exam  Constitutional: He appears well-developed. No distress.  HENT:  Head: Normocephalic and atraumatic.  Eyes: Conjunctivae and EOM are normal.  Cardiovascular: Normal rate and regular rhythm.   Pulmonary/Chest: Effort normal. No stridor. No respiratory distress.  Abdominal: He exhibits no distension.  Musculoskeletal: He exhibits no edema.       Arms: Neurological: He is alert.  Moves all extremities spontaneously, has slow, unsteady gait  Skin: Skin is warm and dry.  Psychiatric: His affect is blunt. His speech is delayed. He is slowed and withdrawn. Cognition and memory are impaired.  Nursing note and vitals reviewed.    ED  Treatments / Results  Labs (all labs ordered are listed, but only abnormal results are displayed) Labs Reviewed  COMPREHENSIVE METABOLIC PANEL - Abnormal; Notable for the following:       Result Value   Sodium 125 (*)    Chloride 92 (*)    Glucose, Bld 106 (*)    AST 107 (*)    All other components within normal limits  CBC WITH DIFFERENTIAL/PLATELET - Abnormal; Notable for the following:    WBC 13.0 (*)    RBC 4.11 (*)    Hemoglobin 12.8 (*)    HCT 35.9 (*)    Platelets 109 (*)    Neutro Abs 9.3 (*)    Monocytes Absolute 2.3 (*)    All other components within normal limits  BASIC METABOLIC PANEL - Abnormal; Notable for the following:    Sodium 130 (*)    Chloride 96 (*)    Glucose, Bld 163 (*)    All other components within normal limits  VALPROIC ACID LEVEL     Procedures Procedures (including critical care time)  Medications Ordered in ED Medications - No data to display   Initial Impression / Assessment and Plan / ED Course  I have reviewed the triage vital signs and the nursing notes.  Pertinent labs & imaging results that were available during my care of the patient were reviewed by me and considered in my medical decision making (see chart for details).  7:40 PM She has received a 2 L normal saline infusion, has had sodium improvement to near normal value, and within the patient's chronic range. He has remained ambulatory, hungry, eating, drinking without complaint. No ongoing pain, no evidence of new neurologic dysfunction. With his improvement in his electrolyte status, absence of evidence for distress, patient returned to his group home.  Final Clinical Impressions(s) / ED Diagnoses  Hyponatremia   Gerhard Munch, MD 08/30/17 1941

## 2017-08-30 NOTE — ED Notes (Signed)
Pt assisted to bedside to use urinal

## 2017-08-30 NOTE — ED Notes (Signed)
Staff from group home here to transport pt, report given,

## 2017-08-30 NOTE — ED Notes (Signed)
Patient ambulatory to desk asking for a sandwich.  Advised patient we did not have any sandwiches.  Advised patient he needed to stay in his room and not be walking around since he was here for a fall.  Patient walked back to his room.

## 2017-08-30 NOTE — ED Notes (Signed)
Pt frequently slidding to end of bed and getting up unassisted . Assisted several times to BR
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
## 2017-08-30 NOTE — ED Notes (Signed)
Received report on pt, pt lying supine on stretcher, updated on plan of care, awaiting results of blood work

## 2017-08-30 NOTE — ED Triage Notes (Signed)
Patient brought in by EMS with complaint of fall x 2. Per EMS, patient is from [facility]. Patient states "I've fell about 12 times today." Patient is only complaining of abdominal pain "because I'm hungry." Denies any other pain. Denies LOC.

## 2017-08-30 NOTE — ED Notes (Signed)
Staff from group home advised that they were on the way to pick pt up,

## 2017-08-30 NOTE — ED Notes (Signed)
Group home called [phone] for pt pick up, staff advised that she would call the owner and return call  To er,

## 2017-09-13 ENCOUNTER — Emergency Department (HOSPITAL_COMMUNITY)
Admission: EM | Admit: 2017-09-13 | Discharge: 2017-09-14 | Disposition: A | Discharge status: Home / Self Care | Payer: Medicare Other | Attending: Emergency Medicine | Admitting: Emergency Medicine

## 2017-09-13 DIAGNOSIS — S0012XA Contusion of left eyelid and periocular area, initial encounter: Secondary | ICD-10-CM | POA: Insufficient documentation

## 2017-09-13 DIAGNOSIS — E119 Type 2 diabetes mellitus without complications: Secondary | ICD-10-CM | POA: Insufficient documentation

## 2017-09-13 DIAGNOSIS — S0083XA Contusion of other part of head, initial encounter: Secondary | ICD-10-CM

## 2017-09-13 DIAGNOSIS — S0990XA Unspecified injury of head, initial encounter: Secondary | ICD-10-CM | POA: Diagnosis not present

## 2017-09-13 DIAGNOSIS — Y999 Unspecified external cause status: Secondary | ICD-10-CM | POA: Insufficient documentation

## 2017-09-13 DIAGNOSIS — I10 Essential (primary) hypertension: Secondary | ICD-10-CM | POA: Insufficient documentation

## 2017-09-13 DIAGNOSIS — Y939 Activity, unspecified: Secondary | ICD-10-CM | POA: Diagnosis not present

## 2017-09-13 DIAGNOSIS — Z23 Encounter for immunization: Secondary | ICD-10-CM | POA: Diagnosis not present

## 2017-09-13 DIAGNOSIS — Z79899 Other long term (current) drug therapy: Secondary | ICD-10-CM | POA: Insufficient documentation

## 2017-09-13 DIAGNOSIS — Y9210 Unspecified residential institution as the place of occurrence of the external cause: Secondary | ICD-10-CM | POA: Insufficient documentation

## 2017-09-13 DIAGNOSIS — E871 Hypo-osmolality and hyponatremia: Secondary | ICD-10-CM | POA: Insufficient documentation

## 2017-09-13 DIAGNOSIS — Z794 Long term (current) use of insulin: Secondary | ICD-10-CM | POA: Diagnosis not present
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
## 2017-09-13 NOTE — ED Notes (Signed)
Per group home, pt has strong smell of urine and they want him checked for UTI as well as altered mental status.

## 2017-09-14 ENCOUNTER — Emergency Department (HOSPITAL_COMMUNITY): Payer: Medicare Other

## 2017-09-14 DIAGNOSIS — S0990XA Unspecified injury of head, initial encounter: Secondary | ICD-10-CM | POA: Diagnosis not present

## 2017-09-14 LAB — CBC WITH DIFFERENTIAL/PLATELET
BASOS ABS: 0 10*3/uL (ref 0.0–0.1)
BASOS PCT: 0 %
EOS ABS: 0.1 10*3/uL (ref 0.0–0.7)
Eosinophils Relative: 1 %
HEMATOCRIT: 31.6 % — AB (ref 39.0–52.0)
HEMOGLOBIN: 11.2 g/dL — AB (ref 13.0–17.0)
Lymphocytes Relative: 29 %
Lymphs Abs: 2.4 10*3/uL (ref 0.7–4.0)
MCH: 31.4 pg (ref 26.0–34.0)
MCHC: 35.4 g/dL (ref 30.0–36.0)
MCV: 88.5 fL (ref 78.0–100.0)
MONO ABS: 0.8 10*3/uL (ref 0.1–1.0)
Monocytes Relative: 10 %
NEUTROS ABS: 4.9 10*3/uL (ref 1.7–7.7)
NEUTROS PCT: 60 %
Platelets: 174 10*3/uL (ref 150–400)
RBC: 3.57 MIL/uL — ABNORMAL LOW (ref 4.22–5.81)
RDW: 13.6 % (ref 11.5–15.5)
WBC: 8.1 10*3/uL (ref 4.0–10.5)

## 2017-09-14 LAB — BASIC METABOLIC PANEL
ANION GAP: 7 (ref 5–15)
BUN: 11 mg/dL (ref 6–20)
CALCIUM: 8.8 mg/dL — AB (ref 8.9–10.3)
CO2: 25 mmol/L (ref 22–32)
Chloride: 92 mmol/L — ABNORMAL LOW (ref 101–111)
Creatinine, Ser: 0.91 mg/dL (ref 0.61–1.24)
Glucose, Bld: 103 mg/dL — ABNORMAL HIGH (ref 65–99)
Potassium: 4.2 mmol/L (ref 3.5–5.1)
Sodium: 124 mmol/L — ABNORMAL LOW (ref 135–145)

## 2017-09-14 MED ORDER — SODIUM CHLORIDE 0.9 % IV BOLUS (SEPSIS)
1000.0000 mL | Freq: Once | INTRAVENOUS | Status: AC
  Administered 2017-09-14: 1000 mL via INTRAVENOUS

## 2017-09-14 MED ORDER — TETANUS-DIPHTH-ACELL PERTUSSIS 5-2.5-18.5 LF-MCG/0.5 IM SUSP
0.5000 mL | Freq: Once | INTRAMUSCULAR | Status: AC
  Administered 2017-09-14: 0.5 mL via INTRAMUSCULAR
  Filled 2017-09-14: qty 0.5

## 2017-09-14 NOTE — ED Notes (Signed)
Pt found sitting in floor at end of bed. Stated he was trying to get to bathroom and got tired so he sat down. VSS. Pt assisted back to bed and reminded not to get out of bed without assistance.

## 2017-09-14 NOTE — ED Notes (Signed)
Called [facility] to arrange for patient to be picked up, group home will call me back with plan

## 2017-09-14 NOTE — ED Notes (Signed)
This RN was notified by tech that pt was sitting in floor. When RN walked in room and asked what happened pt states "i sat down on the floor". Security and Nurse helped back into bed. Pt being transported to CT.
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
## 2017-09-14 NOTE — ED Notes (Signed)
Patient has call bell beside him. Patient states that he got out of bed and sat in the floor because he was tired of waiting to be seen by Dr. Patient states that he did not fall or injury himself. Patient assisted back to bed. Reminded patient to use call bell for assistance.

## 2017-09-14 NOTE — ED Notes (Addendum)
While sitting with room 9, heard pt yelling for help and opened the door to find pt in the floor. Pt said he sat in the floor but couldn't get back up. NT and RN notified and helped pt back into bed.

## 2017-10-05 ENCOUNTER — Emergency Department (HOSPITAL_COMMUNITY): Payer: Medicare Other

## 2017-10-05 ENCOUNTER — Other Ambulatory Visit: Payer: Self-pay

## 2017-10-05 ENCOUNTER — Observation Stay (HOSPITAL_COMMUNITY)
Admission: EM | Admit: 2017-10-05 | Discharge: 2017-10-06 | Disposition: A | Discharge status: Home / Self Care | Payer: Medicare Other | Attending: Internal Medicine | Admitting: Internal Medicine

## 2017-10-05 ENCOUNTER — Encounter (HOSPITAL_COMMUNITY): Payer: Self-pay | Admitting: Cardiology

## 2017-10-05 DIAGNOSIS — R400 Somnolence: Secondary | ICD-10-CM | POA: Diagnosis not present

## 2017-10-05 DIAGNOSIS — F23 Brief psychotic disorder: Secondary | ICD-10-CM | POA: Diagnosis present

## 2017-10-05 DIAGNOSIS — F319 Bipolar disorder, unspecified: Secondary | ICD-10-CM | POA: Diagnosis present

## 2017-10-05 DIAGNOSIS — Z79899 Other long term (current) drug therapy: Secondary | ICD-10-CM | POA: Diagnosis not present

## 2017-10-05 DIAGNOSIS — R531 Weakness: Secondary | ICD-10-CM | POA: Diagnosis present

## 2017-10-05 DIAGNOSIS — F209 Schizophrenia, unspecified: Secondary | ICD-10-CM | POA: Diagnosis not present

## 2017-10-05 DIAGNOSIS — Z794 Long term (current) use of insulin: Secondary | ICD-10-CM | POA: Diagnosis not present

## 2017-10-05 DIAGNOSIS — F3110 Bipolar disorder, current episode manic without psychotic features, unspecified: Secondary | ICD-10-CM | POA: Insufficient documentation

## 2017-10-05 DIAGNOSIS — E119 Type 2 diabetes mellitus without complications: Secondary | ICD-10-CM | POA: Insufficient documentation

## 2017-10-05 DIAGNOSIS — I10 Essential (primary) hypertension: Secondary | ICD-10-CM | POA: Diagnosis not present

## 2017-10-05 DIAGNOSIS — T50905A Adverse effect of unspecified drugs, medicaments and biological substances, initial encounter: Secondary | ICD-10-CM | POA: Diagnosis present

## 2017-10-05 DIAGNOSIS — R4182 Altered mental status, unspecified: Secondary | ICD-10-CM

## 2017-10-05 DIAGNOSIS — R569 Unspecified convulsions: Secondary | ICD-10-CM | POA: Diagnosis not present

## 2017-10-05 HISTORY — DX: Unspecified convulsions: R56.9

## 2017-10-05 LAB — URINALYSIS, COMPLETE (UACMP) WITH MICROSCOPIC
BACTERIA UA: NONE SEEN
Bilirubin Urine: NEGATIVE
Glucose, UA: NEGATIVE mg/dL
Ketones, ur: 5 mg/dL — AB
Leukocytes, UA: NEGATIVE
NITRITE: NEGATIVE
PROTEIN: NEGATIVE mg/dL
SPECIFIC GRAVITY, URINE: 1.014 (ref 1.005–1.030)
pH: 6 (ref 5.0–8.0)

## 2017-10-05 LAB — COMPREHENSIVE METABOLIC PANEL
ALBUMIN: 3.9 g/dL (ref 3.5–5.0)
ALK PHOS: 50 U/L (ref 38–126)
ALT: 8 U/L — AB (ref 17–63)
AST: 22 U/L (ref 15–41)
Anion gap: 7 (ref 5–15)
BUN: 16 mg/dL (ref 6–20)
CALCIUM: 9.3 mg/dL (ref 8.9–10.3)
CHLORIDE: 101 mmol/L (ref 101–111)
CO2: 26 mmol/L (ref 22–32)
CREATININE: 0.97 mg/dL (ref 0.61–1.24)
GFR calc Af Amer: >60 mL/min (ref >60)
GFR calc non Af Amer: >60 mL/min (ref >60)
GLUCOSE: 125 mg/dL — AB (ref 65–99)
Potassium: 4 mmol/L (ref 3.5–5.1)
SODIUM: 134 mmol/L — AB (ref 135–145)
Total Bilirubin: 0.6 mg/dL (ref 0.3–1.2)
Total Protein: 7.1 g/dL (ref 6.5–8.1)

## 2017-10-05 LAB — RAPID URINE DRUG SCREEN, HOSP PERFORMED
AMPHETAMINES: NOT DETECTED
BENZODIAZEPINES: POSITIVE — AB
Barbiturates: NOT DETECTED
Cocaine: NOT DETECTED
OPIATES: NOT DETECTED
Tetrahydrocannabinol: NOT DETECTED

## 2017-10-05 LAB — CBC WITH DIFFERENTIAL/PLATELET
BASOS PCT: 0 %
Basophils Absolute: 0 10*3/uL (ref 0.0–0.1)
EOS ABS: 0 10*3/uL (ref 0.0–0.7)
Eosinophils Relative: 0 %
HEMATOCRIT: 34.1 % — AB (ref 39.0–52.0)
HEMOGLOBIN: 11.5 g/dL — AB (ref 13.0–17.0)
LYMPHS ABS: 1.1 10*3/uL (ref 0.7–4.0)
Lymphocytes Relative: 14 %
MCH: 31.2 pg (ref 26.0–34.0)
MCHC: 33.7 g/dL (ref 30.0–36.0)
MCV: 92.4 fL (ref 78.0–100.0)
MONO ABS: 0.9 10*3/uL (ref 0.1–1.0)
MONOS PCT: 11 %
NEUTROS ABS: 5.9 10*3/uL (ref 1.7–7.7)
NEUTROS PCT: 74 %
Platelets: 95 10*3/uL — ABNORMAL LOW (ref 150–400)
RBC: 3.69 MIL/uL — ABNORMAL LOW (ref 4.22–5.81)
RDW: 14 % (ref 11.5–15.5)
WBC: 7.9 10*3/uL (ref 4.0–10.5)

## 2017-10-05 LAB — GLUCOSE, CAPILLARY: GLUCOSE-CAPILLARY: 144 mg/dL — AB (ref 65–99)

## 2017-10-05 LAB — CBG MONITORING, ED
GLUCOSE-CAPILLARY: 125 mg/dL — AB (ref 65–99)
Glucose-Capillary: 130 mg/dL — ABNORMAL HIGH (ref 65–99)

## 2017-10-05 LAB — MRSA PCR SCREENING: MRSA BY PCR: POSITIVE — AB

## 2017-10-05 LAB — VALPROIC ACID LEVEL: Valproic Acid Lvl: 101 ug/mL — ABNORMAL HIGH (ref 50.0–100.0)

## 2017-10-05 LAB — AMMONIA: AMMONIA: 35 umol/L (ref 9–35)

## 2017-10-05 LAB — I-STAT CG4 LACTIC ACID, ED: Lactic Acid, Venous: 1.07 mmol/L (ref 0.5–1.9)

## 2017-10-05 MED ORDER — ONDANSETRON HCL 4 MG PO TABS: 4.0000 mg | ORAL_TABLET | Freq: Four times a day (QID) | ORAL | Status: DC | PRN

## 2017-10-05 MED ORDER — PROPRANOLOL HCL 20 MG PO TABS
10.0000 mg | ORAL_TABLET | Freq: Two times a day (BID) | ORAL | Status: DC
  Administered 2017-10-05 – 2017-10-06 (×2): 10 mg via ORAL
  Filled 2017-10-05 (×2): qty 1

## 2017-10-05 MED ORDER — AMLODIPINE BESYLATE 5 MG PO TABS
10.0000 mg | ORAL_TABLET | Freq: Every day | ORAL | Status: DC
  Administered 2017-10-06: 10 mg via ORAL
  Filled 2017-10-05: qty 2

## 2017-10-05 MED ORDER — LISINOPRIL 10 MG PO TABS
20.0000 mg | ORAL_TABLET | Freq: Every day | ORAL | Status: DC
  Administered 2017-10-06: 20 mg via ORAL
  Filled 2017-10-05: qty 2

## 2017-10-05 MED ORDER — DIVALPROEX SODIUM 250 MG PO DR TAB
2000.0000 mg | DELAYED_RELEASE_TABLET | Freq: Every day | ORAL | Status: DC
  Administered 2017-10-05: 2000 mg via ORAL
  Filled 2017-10-05: qty 8

## 2017-10-05 MED ORDER — SODIUM CHLORIDE 1 G PO TABS
1.0000 g | ORAL_TABLET | Freq: Every day | ORAL | Status: DC
  Administered 2017-10-06: 1 g via ORAL
  Filled 2017-10-05 (×2): qty 1

## 2017-10-05 MED ORDER — CHLORHEXIDINE GLUCONATE CLOTH 2 % EX PADS: 6.0000 | MEDICATED_PAD | Freq: Every day | CUTANEOUS | Status: DC

## 2017-10-05 MED ORDER — LACTULOSE 10 GM/15ML PO SOLN
20.0000 g | Freq: Every day | ORAL | Status: DC
  Administered 2017-10-06: 20 g via ORAL
  Filled 2017-10-05: qty 30

## 2017-10-05 MED ORDER — ONDANSETRON HCL 4 MG/2ML IJ SOLN: 4.0000 mg | Freq: Four times a day (QID) | INTRAMUSCULAR | Status: DC | PRN

## 2017-10-05 MED ORDER — SENNOSIDES-DOCUSATE SODIUM 8.6-50 MG PO TABS: 1.0000 | ORAL_TABLET | Freq: Every evening | ORAL | Status: DC | PRN

## 2017-10-05 MED ORDER — LORAZEPAM 2 MG/ML IJ SOLN
1.0000 mg | Freq: Once | INTRAMUSCULAR | Status: AC
  Administered 2017-10-06: 1 mg via INTRAVENOUS
  Filled 2017-10-05: qty 1

## 2017-10-05 MED ORDER — CLONAZEPAM 0.5 MG PO TABS
2.0000 mg | ORAL_TABLET | Freq: Three times a day (TID) | ORAL | Status: DC | PRN
  Administered 2017-10-05: 2 mg via ORAL
  Filled 2017-10-05 (×2): qty 4

## 2017-10-05 MED ORDER — BENZTROPINE MESYLATE 1 MG PO TABS
1.0000 mg | ORAL_TABLET | Freq: Two times a day (BID) | ORAL | Status: DC
  Administered 2017-10-05 – 2017-10-06 (×2): 1 mg via ORAL
  Filled 2017-10-05 (×2): qty 1

## 2017-10-05 MED ORDER — LEVETIRACETAM 500 MG PO TABS
1000.0000 mg | ORAL_TABLET | Freq: Every day | ORAL | Status: DC
  Administered 2017-10-05: 1000 mg via ORAL
  Filled 2017-10-05: qty 2

## 2017-10-05 MED ORDER — ACETAMINOPHEN 650 MG RE SUPP: 650.0000 mg | Freq: Four times a day (QID) | RECTAL | Status: DC | PRN

## 2017-10-05 MED ORDER — MUPIROCIN 2 % EX OINT: 1.0000 "application " | TOPICAL_OINTMENT | Freq: Two times a day (BID) | CUTANEOUS | Status: DC

## 2017-10-05 MED ORDER — PANTOPRAZOLE SODIUM 40 MG PO TBEC
40.0000 mg | DELAYED_RELEASE_TABLET | Freq: Every day | ORAL | Status: DC
  Administered 2017-10-06: 40 mg via ORAL
  Filled 2017-10-05: qty 1

## 2017-10-05 MED ORDER — INSULIN DETEMIR 100 UNIT/ML ~~LOC~~ SOLN
28.0000 [IU] | Freq: Every day | SUBCUTANEOUS | Status: DC
  Administered 2017-10-06: 28 [IU] via SUBCUTANEOUS
  Filled 2017-10-05 (×2): qty 0.28

## 2017-10-05 MED ORDER — SODIUM CHLORIDE 0.9 % IV BOLUS (SEPSIS)
1000.0000 mL | Freq: Once | INTRAVENOUS | Status: AC
  Administered 2017-10-05: 1000 mL via INTRAVENOUS

## 2017-10-05 MED ORDER — ENOXAPARIN SODIUM 60 MG/0.6ML ~~LOC~~ SOLN
60.0000 mg | SUBCUTANEOUS | Status: DC
  Administered 2017-10-05: 21:00:00 60 mg via SUBCUTANEOUS
  Filled 2017-10-05: qty 0.6

## 2017-10-05 MED ORDER — ACETAMINOPHEN 325 MG PO TABS: 650.0000 mg | ORAL_TABLET | Freq: Four times a day (QID) | ORAL | Status: DC | PRN

## 2017-10-05 NOTE — ED Provider Notes (Signed)
[facility] EMERGENCY DEPARTMENT Provider Note   CSN: [id number] Arrival date & time: 10/05/17  1003     History   Chief Complaint No chief complaint on file.  Level 5 caveat  HPI Oscar Duke is a 46 y.o. male with a history of diabetes, hypertension, schizophrenia and seizure disorder presenting from his local assisted living facility with altered mental status.  Caregivers at the home endorse that he has had increased confusion over the past week, stating he is generally very talkative and ambulatory, this morning was found on the floor of his room with questionable fall.  He has had increasing weakness and decreased activity level over the past week.  There is been no recognized fevers, chills, no nausea or vomiting.  He does have a history of seizure disorder, it is unclear whether he may have had a seizure, he has had no witnessed seizures prior to arrival.  The history is provided by the patient, the nursing home and medical records. The history is limited by the condition of the patient.    Past Medical History:  Diagnosis Date  . Bipolar 1 disorder (HCC)   . Diabetes mellitus without complication (HCC)   . Hyperlipidemia   . Hypertension   . 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 Schizophrenia, acute (HCC)   . Seizures [facility])     Patient Active Problem List   Diagnosis Date Noted  . Schizophrenia, acute (HCC) 05/14/2017    Past Surgical History:  Procedure Laterality Date  . JOINT REPLACEMENT         Home Medications    Prior to Admission medications   Medication Sig Start Date End Date Taking? Authorizing Provider  amLODipine (NORVASC) 10 MG tablet Take 10 mg by mouth daily.   Yes [provider]  benztropine (COGENTIN) 1 MG tablet Take 1 mg by mouth 2 (two) times daily.    Yes [provider]  Cholecalciferol (VITAMIN D3) 2000 units TABS Take 2,000 Units by mouth daily.    Yes [provider]  clonazePAM  (KLONOPIN) 2 MG tablet Take 2 mg by mouth 3 (three) times daily as needed for anxiety.   Yes [provider]  divalproex (DEPAKOTE) 500 MG DR tablet Take 2,000 mg by mouth at bedtime.   Yes [provider]  FLUoxetine (PROZAC) 10 MG capsule Take 10 mg by mouth daily.   Yes [provider]  insulin detemir (LEVEMIR) 100 UNIT/ML injection Inject 28 Units into the skin daily.   Yes [provider]  Lactulose 20 GM/30ML SOLN Take 30 mLs by mouth daily.   Yes [provider]  levETIRAcetam (KEPPRA) 1000 MG tablet Take 1,000 mg by mouth at bedtime.   Yes [provider]  lisinopril (PRINIVIL,ZESTRIL) 20 MG tablet Take 20 mg by mouth daily.   Yes [provider]  omeprazole (PRILOSEC) 20 MG capsule Take 20 mg by mouth daily.   Yes [provider]  perphenazine (TRILAFON) 8 MG tablet Take 4 mg by mouth 2 (two) times daily. 1 tab at bedtime   Yes [provider]  propranolol (INDERAL) 10 MG tablet Take 10 mg by mouth 2 (two) times daily.   Yes [provider]  sodium chloride 1 g tablet Take 1 g by mouth daily.   Yes [provider]  traZODone (DESYREL) 150 MG tablet Take by mouth at bedtime.   Yes [provider]    Family History History reviewed. No pertinent family  history.  Social History Social History   Tobacco Use  . Smoking status: Never Smoker  . Smokeless tobacco: Never Used  Substance Use Topics  . Alcohol use: No  . Drug use: Yes    Types: Marijuana    Comment: quit 5 years ago     Allergies   Haldol [haloperidol lactate]; Inderal [propranolol]; and Lithium   Review of Systems Review of Systems  Unable to perform ROS: Mental status change     Physical Exam Updated Vital Signs BP 121/71   Pulse 80   Temp 98.1 F (36.7 C) (Oral)   Resp 20   Ht 6\' 4"  (1.93 m)   Wt 120.2 kg (265 lb)   SpO2 99%   BMI 32.26 kg/m   Physical Exam  Constitutional: He appears  well-developed and well-nourished.  Pt is cooperative, Alert to person and place.  Rambling, sometimes incoherent speech. Will answer questions appropriately when directed.  HENT:  Head: Normocephalic and atraumatic.  Mouth/Throat: Uvula is midline. Mucous membranes are dry. No posterior oropharyngeal edema or posterior oropharyngeal erythema.  Eyes: Conjunctivae and EOM are normal. Pupils are equal, round, and reactive to light.  Neck: Normal range of motion.  Cardiovascular: Normal rate, regular rhythm, normal heart sounds and intact distal pulses.  Pulmonary/Chest: Effort normal and breath sounds normal. He has no wheezes.  Abdominal: Soft. Bowel sounds are normal. There is no tenderness. There is no guarding.  Musculoskeletal: Normal range of motion. He exhibits edema.       Left elbow: He exhibits swelling. Tenderness found.  Neurological: He is alert. He has normal strength. He is disoriented.  Equal grip strength.  Patient moves all 4 extremities.  No neglect.  Skin: Skin is warm and dry.  Old appearing abrasion right knee.  There is also an old appearing abrasion and a abrasion on the left elbow.  Psychiatric: He has a normal mood and affect.  Nursing note and vitals reviewed.    ED Treatments / Results  Labs (all labs ordered are listed, but only abnormal results are displayed) Results for orders placed or performed during the hospital encounter of 10/05/17  Comprehensive metabolic panel  Result Value Ref Range   Sodium 134 (L) 135 - 145 mmol/L   Potassium 4.0 3.5 - 5.1 mmol/L   Chloride 101 101 - 111 mmol/L   CO2 26 22 - 32 mmol/L   Glucose, Bld 125 (H) 65 - 99 mg/dL   BUN 16 6 - 20 mg/dL   Creatinine, Ser 4.09 0.61 - 1.24 mg/dL   Calcium 9.3 8.9 - 81.1 mg/dL   Total Protein 7.1 6.5 - 8.1 g/dL   Albumin 3.9 3.5 - 5.0 g/dL   AST 22 15 - 41 U/L   ALT 8 (L) 17 - 63 U/L   Alkaline Phosphatase 50 38 - 126 U/L   Total Bilirubin 0.6 0.3 - 1.2 mg/dL   GFR calc non Af Amer  >60 >60 mL/min   GFR calc Af Amer >60 >60 mL/min   Anion gap 7 5 - 15  CBC WITH DIFFERENTIAL  Result Value Ref Range   WBC 7.9 4.0 - 10.5 K/uL   RBC 3.69 (L) 4.22 - 5.81 MIL/uL   Hemoglobin 11.5 (L) 13.0 - 17.0 g/dL   HCT 91.4 (L) 78.2 - 95.6 %   MCV 92.4 78.0 - 100.0 fL   MCH 31.2 26.0 - 34.0 pg   MCHC 33.7 30.0 - 36.0 g/dL   RDW 21.3 08.6 - 57.8 %  Platelets 95 (L) 150 - 400 K/uL   Neutrophils Relative % 74 %   Neutro Abs 5.9 1.7 - 7.7 K/uL   Lymphocytes Relative 14 %   Lymphs Abs 1.1 0.7 - 4.0 K/uL   Monocytes Relative 11 %   Monocytes Absolute 0.9 0.1 - 1.0 K/uL   Eosinophils Relative 0 %   Eosinophils Absolute 0.0 0.0 - 0.7 K/uL   Basophils Relative 0 %   Basophils Absolute 0.0 0.0 - 0.1 K/uL  Urinalysis, Complete w Microscopic  Result Value Ref Range   Color, Urine YELLOW YELLOW   APPearance CLEAR CLEAR   Specific Gravity, Urine 1.014 1.005 - 1.030   pH 6.0 5.0 - 8.0   Glucose, UA NEGATIVE NEGATIVE mg/dL   Hgb urine dipstick MODERATE (A) NEGATIVE   Bilirubin Urine NEGATIVE NEGATIVE   Ketones, ur 5 (A) NEGATIVE mg/dL   Protein, ur NEGATIVE NEGATIVE mg/dL   Nitrite NEGATIVE NEGATIVE   Leukocytes, UA NEGATIVE NEGATIVE   RBC / HPF 6-30 0 - 5 RBC/hpf   WBC, UA 0-5 0 - 5 WBC/hpf   Bacteria, UA NONE SEEN NONE SEEN   Squamous Epithelial / LPF 0-5 (A) NONE SEEN   Mucus PRESENT   Ammonia  Result Value Ref Range   Ammonia 35 9 - 35 umol/L  Urine rapid drug screen (hosp performed)  Result Value Ref Range   Opiates NONE DETECTED NONE DETECTED   Cocaine NONE DETECTED NONE DETECTED   Benzodiazepines POSITIVE (A) NONE DETECTED   Amphetamines NONE DETECTED NONE DETECTED   Tetrahydrocannabinol NONE DETECTED NONE DETECTED   Barbiturates NONE DETECTED NONE DETECTED  Valproic acid level  Result Value Ref Range   Valproic Acid Lvl 101 (H) 50.0 - 100.0 ug/mL  CBG monitoring, ED  Result Value Ref Range   Glucose-Capillary 125 (H) 65 - 99 mg/dL  CBG monitoring, ED  Result  Value Ref Range   Glucose-Capillary 130 (H) 65 - 99 mg/dL   Comment 1 Notify RN    Comment 2 Document in Chart   I-Stat CG4 Lactic Acid, ED  Result Value Ref Range   Lactic Acid, Venous 1.07 0.5 - 1.9 mmol/L      EKG  EKG Interpretation  Date/Time:  Saturday October 05 2017 10:18:37 EST Ventricular Rate:  87 PR Interval:    QRS Duration: 83 QT Interval:  354 QTC Calculation: 426 R Axis:   92 Text Interpretation:  unremarkable ECGSinus rhythm Borderline right axis deviation Borderline low voltage, extremity leads unremarkable ECG Confirmed by Gerhard MunchLockwood, Robert [phone]([id number]) on 10/05/2017 10:28:24 AM       Radiology Dg Chest 2 View  Result Date: 10/05/2017 CLINICAL DATA:  46 year old male with a history of left-sided bruising EXAM: CHEST  2 VIEW COMPARISON:  None. FINDINGS: Cardiomediastinal silhouette within normal limits. No evidence of central vascular congestion. No interlobular septal thickening. No confluent airspace disease. No pneumothorax or pleural effusion. Low lung volumes. No displaced fracture identified. IMPRESSION: Low lung volumes with no evidence of acute cardiopulmonary disease Electronically Signed   By: Gilmer MorJaime  Wagner D.O.   On: 10/05/2017 11:40   Dg Elbow Complete Left  Result Date: 10/05/2017 CLINICAL DATA:  46 year old male with a history of fall and bruising EXAM: LEFT ELBOW - COMPLETE 3+ VIEW COMPARISON:  None. FINDINGS: There is no evidence of fracture, dislocation, or joint effusion. There is no evidence of arthropathy or other focal bone abnormality. Soft tissues are unremarkable. IMPRESSION: Negative for acute bony abnormality Electronically Signed   By: Marijean NiemannJaime  Loreta AveWagner D.O.   On: 10/05/2017 11:41   Ct Head Wo Contrast  Result Date: 10/05/2017 CLINICAL DATA:  Unexplained altered level consciousness. Fall this morning. Initial encounter. EXAM: CT HEAD WITHOUT CONTRAST TECHNIQUE: Contiguous axial images were obtained from the base of the skull through the  vertex without intravenous contrast. COMPARISON:  09/14/2017 FINDINGS: Brain: No evidence of acute infarction, hemorrhage, obstructive hydrocephalus, extra-axial collection, or mass lesion/mass effect. Stable central pattern of cerebral atrophy with associated ventriculomegaly. Vascular:  No hyperdense vessel or other acute findings. Skull: No evidence of fracture or other significant bone abnormality. Sinuses/Orbits: No acute findings. Mucosal thickening again seen in the maxillary sinuses bilaterally. Other: None. IMPRESSION: No acute intracranial abnormality.  Stable cerebral atrophy. Electronically Signed   By: Myles RosenthalJohn  Stahl M.D.   On: 10/05/2017 11:46    Procedures Procedures (including critical care time)  Medications Ordered in ED Medications  sodium chloride 0.9 % bolus 1,000 mL (0 mLs Intravenous Stopped 10/05/17 1135)     Initial Impression / Assessment and Plan / ED Course  I have reviewed the triage vital signs and the nursing notes.  Pertinent labs & imaging results that were available during my care of the patient were reviewed by me and considered in my medical decision making (see chart for details).     Per care givers at home,  Pt is normally conversive, ambulatory, alert and generally a pleasant resident of their home. Past week has witness slowly worsening confusion, increased drowsiness, less active, decreased appetite with no specific c/o pain or sx. Pt has been sleeping the majority of his visit here but does awake to voice before falling back to sleep. Ate and drank, no emesis, no fevers here, no evidence for sepsis. Ct head negative for acute intracranial injury. Pt with altered mental status, but he is on a number of sedating medicines, possibly medication side effect.    Discussed with Dr. Jeraldine LootsLockwood who saw patient during this visit.  Will need admission for AMS.    Discussed with Dr. Adrian BlackwaterStinson who will see pt in ed.  Final Clinical Impressions(s) / ED Diagnoses    Final diagnoses:  Somnolence  Altered mental status, unspecified altered mental status type    ED Discharge Orders    None       Victoriano Laindol, Jaylinn Hellenbrand, PA-C 10/05/17 1545    Gerhard MunchLockwood, Robert, MD 10/06/17 1539
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
## 2017-10-05 NOTE — ED Notes (Signed)
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
## 2017-10-05 NOTE — ED Triage Notes (Signed)
[facility],  Per staff pt has not been acting right all week.  Staff states pt is usually getting up out of bed and talking more.  Pt had to be assisted to get up to EMS stretcher.  Per staff  ? Fall this morning .  Pt found laying in floor.   VSS with EMS.

## 2017-10-05 NOTE — ED Notes (Signed)
Patient transported to CT 

## 2017-10-05 NOTE — ED Notes (Signed)
PT given urinal, explained need for urinalysis.

## 2017-10-05 NOTE — H&P (Signed)
History and Physical  Oscar Duke ZOX:[id number] DOB: 08/06/1971 DOA: 10/05/2017  Referring physician: Victoriano Lain, ED provider PCP: Duffy Rhody, FNP    Patient Coming From: assisted living facility: [facility]  Chief Complaint: Somnolence  HPI: Oscar Duke is a 46 y.o. male with a history of bipolar disorder, diabetes, hypertension, schizophrenia, seizure disorder.  Patient currently lives in an assisted living facility.  Patient is normally fairly conversive and interactive.  Over the past few days, his caregivers have noted that he has had increasing weakness and decreased activity level.  There have been no recognized fevers, chills, nausea, vomiting.  He does have a seizure disorder, but has had no witnessed seizures.  The patient is a unreliable historian and history is obtained by patient, medical records, and the assisted living staff.  The only change to his medications have been Prozac, which was started on the 11/16.   Emergency Department Course: Workup has been relatively normal  Review of Systems:   Patient not reliable, but denies any fevers, chills, nausea, vomiting, diarrhea, constipation, abdominal pain, shortness of breath, dyspnea on exertion, orthopnea, cough, wheezing, palpitations, headache, vision changes, lightheadedness, dizziness, melena, rectal bleeding.  Review of systems are otherwise negative  Past Medical History:  Diagnosis Date  . Bipolar 1 disorder (HCC)   . Diabetes mellitus without complication (HCC)   . Hyperlipidemia   . Hypertension   . 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 Schizophrenia, acute (HCC)   . Seizures (HCC)    Past Surgical History:  Procedure Laterality Date  . JOINT REPLACEMENT     Social History:  reports that  has never smoked. he has never used smokeless tobacco. He reports that he uses drugs. Drug: Marijuana. He reports that he does not drink alcohol. Patient lives at [facility] facility  Allergies  Allergen Reactions  . Haldol [Haloperidol Lactate]   . Inderal [Propranolol]   . Lithium     History reviewed. No pertinent family history.  Patient unable to give a family medical history  Prior to Admission medications   Medication Sig Start Date End Date Taking? Authorizing Provider  amLODipine (NORVASC) 10 MG tablet Take 10 mg by mouth daily.   Yes [provider]  benztropine (COGENTIN) 1 MG tablet Take 1 mg by mouth 2 (two) times daily.    Yes [provider]  Cholecalciferol (VITAMIN D3) 2000 units TABS Take 2,000 Units by mouth daily.    Yes [provider]  clonazePAM (KLONOPIN) 2 MG tablet Take 2 mg by mouth 3 (three) times daily as needed for anxiety.   Yes [provider]  divalproex (DEPAKOTE) 500 MG DR tablet Take 2,000 mg by mouth at bedtime.   Yes [provider]  FLUoxetine (PROZAC) 10 MG capsule Take 10 mg by mouth daily.   Yes [provider]  insulin detemir (LEVEMIR) 100 UNIT/ML injection Inject 28 Units into the skin daily.   Yes [provider]  Lactulose 20 GM/30ML SOLN Take 30 mLs by mouth daily.   Yes [provider]  levETIRAcetam (KEPPRA) 1000 MG tablet Take 1,000 mg by mouth at bedtime.   Yes [provider]  lisinopril (PRINIVIL,ZESTRIL) 20 MG tablet Take 20 mg by mouth daily.   Yes [provider]  omeprazole (PRILOSEC) 20 MG capsule Take 20 mg by mouth daily.   Yes [provider]  perphenazine (TRILAFON) 8 MG tablet Take 4 mg by mouth 2 (two) times daily.  1 tab at bedtime   Yes [provider]  propranolol (INDERAL) 10 MG tablet Take 10 mg by mouth 2 (two) times daily.   Yes [provider]  sodium chloride 1 g tablet Take 1 g by mouth daily.   Yes [provider]  traZODone (DESYREL) 150 MG tablet Take by mouth at bedtime.   Yes [provider]    Physical Exam: BP 121/71   Pulse 80   Temp  98.1 F (36.7 C) (Oral)   Resp 20   Ht 6\' 4"  (1.93 m)   Wt 120.2 kg (265 lb)   SpO2 99%   BMI 32.26 kg/m   General: Middle-aged Caucasian male. Awake and alert and oriented to self. No acute cardiopulmonary distress.  HEENT: Normocephalic atraumatic.  Right and left ears normal in appearance.  Pupils equal, round, reactive to light. Extraocular muscles are intact. Sclerae anicteric and noninjected.  Moist mucosal membranes. No mucosal lesions.  Neck: Neck supple without lymphadenopathy. No carotid bruits. No masses palpated.  Cardiovascular: Regular rate with normal S1-S2 sounds. No murmurs, rubs, gallops auscultated. No JVD.  Respiratory: Good respiratory effort with no wheezes, rales, rhonchi. Lungs clear to auscultation bilaterally.  No accessory muscle use. Abdomen: Soft, nontender, nondistended. Active bowel sounds. No masses or hepatosplenomegaly  Skin: No rashes, lesions, or ulcerations.  Dry, warm to touch. 2+ dorsalis pedis and radial pulses. Musculoskeletal: No calf or leg pain. All major joints not erythematous nontender.  No upper or lower joint deformation.  Good ROM.  No contractures  Psychiatric: Intact judgment and insight. Pleasant and cooperative. Neurologic: No focal neurological deficits. Strength is 5/5 and symmetric in upper and lower extremities.  Cranial nerves II through XII are grossly intact.           Labs on Admission: I have personally reviewed following labs and imaging studies  CBC: Recent Labs  Lab 10/05/17 1026  WBC 7.9  NEUTROABS 5.9  HGB 11.5*  HCT 34.1*  MCV 92.4  PLT 95*   Basic Metabolic Panel: Recent Labs  Lab 10/05/17 1026  NA 134*  K 4.0  CL 101  CO2 26  GLUCOSE 125*  BUN 16  CREATININE 0.97  CALCIUM 9.3   GFR: Estimated Creatinine Clearance: 134.9 mL/min (by C-G formula based on SCr of 0.97 mg/dL). Liver Function Tests: Recent Labs  Lab 10/05/17 1026  AST 22  ALT 8*  ALKPHOS 50  BILITOT 0.6  PROT 7.1  ALBUMIN 3.9     No results for input(s): LIPASE, AMYLASE in the last 168 hours. Recent Labs  Lab 10/05/17 1026  AMMONIA 35   Coagulation Profile: No results for input(s): INR, PROTIME in the last 168 hours. Cardiac Enzymes: No results for input(s): CKTOTAL, CKMB, CKMBINDEX, TROPONINI in the last 168 hours. BNP (last 3 results) No results for input(s): PROBNP in the last 8760 hours. HbA1C: No results for input(s): HGBA1C in the last 72 hours. CBG: Recent Labs  Lab 10/05/17 1023 10/05/17 1506  GLUCAP 125* 130*   Lipid Profile: No results for input(s): CHOL, HDL, LDLCALC, TRIG, CHOLHDL, LDLDIRECT in the last 72 hours. Thyroid Function Tests: No results for input(s): TSH, T4TOTAL, FREET4, T3FREE, THYROIDAB in the last 72 hours. Anemia Panel: No results for input(s): VITAMINB12, FOLATE, FERRITIN, TIBC, IRON, RETICCTPCT in the last 72 hours. Urine analysis:    Component Value Date/Time   COLORURINE YELLOW 10/05/2017 1026   APPEARANCEUR CLEAR 10/05/2017 1026   LABSPEC 1.014 10/05/2017 1026   PHURINE 6.0  10/05/2017 1026   GLUCOSEU NEGATIVE 10/05/2017 1026   HGBUR MODERATE (A) 10/05/2017 1026   BILIRUBINUR NEGATIVE 10/05/2017 1026   KETONESUR 5 (A) 10/05/2017 1026   PROTEINUR NEGATIVE 10/05/2017 1026   NITRITE NEGATIVE 10/05/2017 1026   LEUKOCYTESUR NEGATIVE 10/05/2017 1026   Sepsis Labs: @LABRCNTIP (procalcitonin:4,lacticidven:4) )No results found for this or any previous visit (from the past 240 hour(s)).   Radiological Exams on Admission: Dg Chest 2 View  Result Date: 10/05/2017 CLINICAL DATA:  46 year old male with a history of left-sided bruising EXAM: CHEST  2 VIEW COMPARISON:  None. FINDINGS: Cardiomediastinal silhouette within normal limits. No evidence of central vascular congestion. No interlobular septal thickening. No confluent airspace disease. No pneumothorax or pleural effusion. Low lung volumes. No displaced fracture identified. IMPRESSION: Low lung volumes with no  evidence of acute cardiopulmonary disease Electronically Signed   By: Gilmer Mor D.O.   On: 10/05/2017 11:40   Dg Elbow Complete Left  Result Date: 10/05/2017 CLINICAL DATA:  46 year old male with a history of fall and bruising EXAM: LEFT ELBOW - COMPLETE 3+ VIEW COMPARISON:  None. FINDINGS: There is no evidence of fracture, dislocation, or joint effusion. There is no evidence of arthropathy or other focal bone abnormality. Soft tissues are unremarkable. IMPRESSION: Negative for acute bony abnormality Electronically Signed   By: Gilmer Mor D.O.   On: 10/05/2017 11:41   Ct Head Wo Contrast  Result Date: 10/05/2017 CLINICAL DATA:  Unexplained altered level consciousness. Fall this morning. Initial encounter. EXAM: CT HEAD WITHOUT CONTRAST TECHNIQUE: Contiguous axial images were obtained from the base of the skull through the vertex without intravenous contrast. COMPARISON:  09/14/2017 FINDINGS: Brain: No evidence of acute infarction, hemorrhage, obstructive hydrocephalus, extra-axial collection, or mass lesion/mass effect. Stable central pattern of cerebral atrophy with associated ventriculomegaly. Vascular:  No hyperdense vessel or other acute findings. Skull: No evidence of fracture or other significant bone abnormality. Sinuses/Orbits: No acute findings. Mucosal thickening again seen in the maxillary sinuses bilaterally. Other: None. IMPRESSION: No acute intracranial abnormality.  Stable cerebral atrophy. Electronically Signed   By: Myles Rosenthal M.D.   On: 10/05/2017 11:46    EKG: Independently reviewed.  Sinus rhythm with borderline right axis deviation.  No acute ST changes.  Assessment/Plan: Active Problems:   Schizophrenia, acute (HCC)   Somnolence   Adverse drug interaction with prescription medication   Hypertension   Diabetes mellitus without complication (HCC)   Bipolar 1 disorder (HCC)   Seizures (HCC)    This patient was discussed with the ED physician, including pertinent  vitals, physical exam findings, labs, and imaging.  We also discussed care given by the ED provider.  1. Somnolence 2.  Adverse drug interaction with prescription medication  Observation on telemetry and continuous pulse oximetry  Hypersomnolence likely secondary to addition of Prozac to his medications: Prozac is a strong inhibitor of CYP2D6 and perphenazine has been several metabolites that utilize the CYP 2 D6 system.   Hold perphenazine  Would recommend discontinuing Prozac -no treatment necessary as Prozac has a long half-life and the patient is on such a small dose  PT assessment for fall risk 3.  Schizophrenia  Continue home medications 4.  Bipolar disorder  Continue home medications 5.  Seizure disorder  Continue Keppra and Depakote 6.  Hypertension  Continue lisinopril and amlodipine 7.  Diabetes  Continue insulin  DVT prophylaxis: Lovenox Consultants: None Code Status: Full code Family Communication: None Disposition Plan: [facility] to [facility] tomorrow   Candelaria Celeste  J, DO [facility] Pager [phone]  If 7PM-7AM, please contact night-coverage [email or web address] Password [email or web address]

## 2017-10-06 DIAGNOSIS — F319 Bipolar disorder, unspecified: Secondary | ICD-10-CM | POA: Diagnosis not present

## 2017-10-06 DIAGNOSIS — F209 Schizophrenia, unspecified: Secondary | ICD-10-CM | POA: Diagnosis not present

## 2017-10-06 DIAGNOSIS — T50905A Adverse effect of unspecified drugs, medicaments and biological substances, initial encounter: Secondary | ICD-10-CM | POA: Diagnosis not present

## 2017-10-06 NOTE — Progress Notes (Signed)
[facility] requires, FL2 paper work and discharge summary to be faxed and sent with patient.  Awaiting Social worker Tara for [facility]. When it is complete and we have faxed it to [phone], Call [facility] @ [phone], and they will send transport for him.    I already called a medical report to nurse at [facility] and removed patient's IV, site intact.

## 2017-10-06 NOTE — Social Work (Addendum)
CSW spoke with nurse Dois DavenportSandra and ask her to print off FL2 to give to group home staff at discharge.  No further needs at this time.  Budd Palmerara Cassaundra Rasch, LCSWA [phone]

## 2017-10-06 NOTE — Discharge Summary (Signed)
Physician Discharge Summary  Oscar Duke ZOX:[id number] DOB: [birth year] DOA: 10/05/2017  PCP: Duffy Rhody, FNP  Admit date: 10/05/2017 Discharge date: 10/06/2017  Time spent: 45 minutes  Recommendations for Outpatient Follow-up:  -Will be discharged home today. -Advised to follow up with PCP in 2 weeks. -Would recommend cessation of Prozac.   Discharge Diagnoses:  Active Problems:   Schizophrenia, acute (HCC)   Somnolence   Adverse drug interaction with prescription medication   Hypertension   Diabetes mellitus without complication (HCC)   Bipolar 1 disorder (HCC)   Seizures (HCC)   Discharge Condition: Stable and improved  Filed Weights   10/05/17 1007 10/05/17 1723  Weight: 120.2 kg (265 lb) 120.2 kg (264 lb 15.9 oz)    History of present illness:  As per Dr. Adrian Blackwater on 11/24: Oscar Duke is a 46 y.o. male with a history of bipolar disorder, diabetes, hypertension, schizophrenia, seizure disorder.  Patient currently lives in an assisted living facility.  Patient is normally fairly conversive and interactive.  Over the past few days, his caregivers have noted that he has had increasing weakness and decreased activity level.  There have been no recognized fevers, chills, nausea, vomiting.  He does have a seizure disorder, but has had no witnessed seizures.  The patient is a unreliable historian and history is obtained by patient, medical records, and the assisted living staff.  The only change to his medications have been Prozac, which was started on the 11/16.     Hospital Course:   Somnolence -Believed to be a drug interaction between Prozac and and Perphenazine due to inhibition of metabolization of perphenazine by Prozac. -Would recommend cessation of Prozac. -Ok to continue perphenazine as he has been on this long-term (Prozac just started 11/16).  Rest of medical issues have been stable.  Procedures:  None   Consultations:  None  Discharge  Instructions  Discharge Instructions    Diet - low sodium heart healthy   Complete by:  As directed    Increase activity slowly   Complete by:  As directed      Allergies as of 10/06/2017      Reactions   Haldol [haloperidol Lactate]    Inderal [propranolol]    Lithium       Medication List    STOP taking these medications   FLUoxetine 10 MG capsule Commonly known as:  PROZAC     TAKE these medications   amLODipine 10 MG tablet Commonly known as:  NORVASC Take 10 mg by mouth daily.   benztropine 1 MG tablet Commonly known as:  COGENTIN Take 1 mg by mouth 2 (two) times daily.   clonazePAM 2 MG tablet Commonly known as:  KLONOPIN Take 2 mg by mouth 3 (three) times daily as needed for anxiety.   divalproex 500 MG DR tablet Commonly known as:  DEPAKOTE Take 2,000 mg by mouth at bedtime.   insulin detemir 100 UNIT/ML injection Commonly known as:  LEVEMIR Inject 28 Units into the skin daily.   Lactulose 20 GM/30ML Soln Take 30 mLs by mouth daily.   levETIRAcetam 1000 MG tablet Commonly known as:  KEPPRA Take 1,000 mg by mouth at bedtime.   lisinopril 20 MG tablet Commonly known as:  PRINIVIL,ZESTRIL Take 20 mg by mouth daily.   omeprazole 20 MG capsule Commonly known as:  PRILOSEC Take 20 mg by mouth daily.   perphenazine 8 MG tablet Commonly known as:  TRILAFON Take 4 mg by mouth 2 (two) times  daily. 1 tab at bedtime   propranolol 10 MG tablet Commonly known as:  INDERAL Take 10 mg by mouth 2 (two) times daily.   sodium chloride 1 g tablet Take 1 g by mouth daily.   traZODone 150 MG tablet Commonly known as:  DESYREL Take by mouth at bedtime.   Vitamin D3 2000 units Tabs Take 2,000 Units by mouth daily.      Allergies  Allergen Reactions  . Haldol [Haloperidol Lactate]   . Inderal [Propranolol]   . Lithium    Follow-up Information    Duffy RhodyPartridge, Tanillya, FNP. Schedule an appointment as soon as possible for a visit in 2 week(s).     Specialty:  Family Medicine Contact information: [address] [place] [phone]            The results of significant diagnostics from this hospitalization (including imaging, microbiology, ancillary and laboratory) are listed below for reference.    Significant Diagnostic Studies: Dg Chest 2 View  Result Date: 10/05/2017 CLINICAL DATA:  46 year old male with a history of left-sided bruising EXAM: CHEST  2 VIEW COMPARISON:  None. FINDINGS: Cardiomediastinal silhouette within normal limits. No evidence of central vascular congestion. No interlobular septal thickening. No confluent airspace disease. No pneumothorax or pleural effusion. Low lung volumes. No displaced fracture identified. IMPRESSION: Low lung volumes with no evidence of acute cardiopulmonary disease Electronically Signed   By: Gilmer MorJaime  Wagner D.O.   On: 10/05/2017 11:40   Dg Elbow Complete Left  Result Date: 10/05/2017 CLINICAL DATA:  46 year old male with a history of fall and bruising EXAM: LEFT ELBOW - COMPLETE 3+ VIEW COMPARISON:  None. FINDINGS: There is no evidence of fracture, dislocation, or joint effusion. There is no evidence of arthropathy or other focal bone abnormality. Soft tissues are unremarkable. IMPRESSION: Negative for acute bony abnormality Electronically Signed   By: Gilmer MorJaime  Wagner D.O.   On: 10/05/2017 11:41   Ct Head Wo Contrast  Result Date: 10/05/2017 CLINICAL DATA:  Unexplained altered level consciousness. Fall this morning. Initial encounter. EXAM: CT HEAD WITHOUT CONTRAST TECHNIQUE: Contiguous axial images were obtained from the base of the skull through the vertex without intravenous contrast. COMPARISON:  09/14/2017 FINDINGS: Brain: No evidence of acute infarction, hemorrhage, obstructive hydrocephalus, extra-axial collection, or mass lesion/mass effect. Stable central pattern of cerebral atrophy with associated ventriculomegaly. Vascular:  No hyperdense vessel  or other acute findings. Skull: No evidence of fracture or other significant bone abnormality. Sinuses/Orbits: No acute findings. Mucosal thickening again seen in the maxillary sinuses bilaterally. Other: None. IMPRESSION: No acute intracranial abnormality.  Stable cerebral atrophy. Electronically Signed   By: Myles RosenthalJohn  Stahl M.D.   On: 10/05/2017 11:46   Ct Head Wo Contrast  Result Date: 09/14/2017 CLINICAL DATA:  Head trauma, minor, GCS>=13, high clinical risk, initial exam; Facial trauma, fx suspected. EXAM: CT HEAD WITHOUT CONTRAST CT MAXILLOFACIAL WITHOUT CONTRAST TECHNIQUE: Multidetector CT imaging of the head and maxillofacial structures were performed using the standard protocol without intravenous contrast. Multiplanar CT image reconstructions of the maxillofacial structures were also generated. COMPARISON:  None. FINDINGS: CT HEAD FINDINGS Brain: No intracranial hemorrhage. Large bilateral lateral ventricles without periventricular white matter changes. Third and fourth ventricle is normal. Moderate generalized atrophy for age. No subdural collection. No evidence of acute ischemia. No mass effect or midline shift. Vascular: Atherosclerosis of skullbase vasculature without hyperdense vessel or abnormal calcification. Skull: No fracture or focal lesion.  Hyperostosis frontalis. Other: None. CT MAXILLOFACIAL FINDINGS Osseous: Nasal bone,  zygomatic arches, and mandibles are intact. The temporomandibular joints are congruent. Scattered dental caries and missing teeth. Orbits: Both orbits and globes are intact.  No orbital fracture. Sinuses: Mucosal thickening of the maxillary sinuses with possible mucous retention cyst. No fluid levels or acute inflammation. Soft tissues: Scattered soft tissue edema.  No foreign body. IMPRESSION: 1. No acute traumatic injury to the brain or face. No facial bone or skull fracture. 2. Chronic appearing ventricular enlargement, however no prior exams available for comparison.  Generalized atrophy is age advanced. Electronically Signed   By: Rubye Oaks M.D.   On: 09/14/2017 05:40   Ct Maxillofacial Wo Contrast  Result Date: 09/14/2017 CLINICAL DATA:  Head trauma, minor, GCS>=13, high clinical risk, initial exam; Facial trauma, fx suspected. EXAM: CT HEAD WITHOUT CONTRAST CT MAXILLOFACIAL WITHOUT CONTRAST TECHNIQUE: Multidetector CT imaging of the head and maxillofacial structures were performed using the standard protocol without intravenous contrast. Multiplanar CT image reconstructions of the maxillofacial structures were also generated. COMPARISON:  None. FINDINGS: CT HEAD FINDINGS Brain: No intracranial hemorrhage. Large bilateral lateral ventricles without periventricular white matter changes. Third and fourth ventricle is normal. Moderate generalized atrophy for age. No subdural collection. No evidence of acute ischemia. No mass effect or midline shift. Vascular: Atherosclerosis of skullbase vasculature without hyperdense vessel or abnormal calcification. Skull: No fracture or focal lesion.  Hyperostosis frontalis. Other: None. CT MAXILLOFACIAL FINDINGS Osseous: Nasal bone, zygomatic arches, and mandibles are intact. The temporomandibular joints are congruent. Scattered dental caries and missing teeth. Orbits: Both orbits and globes are intact.  No orbital fracture. Sinuses: Mucosal thickening of the maxillary sinuses with possible mucous retention cyst. No fluid levels or acute inflammation. Soft tissues: Scattered soft tissue edema.  No foreign body. IMPRESSION: 1. No acute traumatic injury to the brain or face. No facial bone or skull fracture. 2. Chronic appearing ventricular enlargement, however no prior exams available for comparison. Generalized atrophy is age advanced. Electronically Signed   By: Rubye Oaks M.D.   On: 09/14/2017 05:40    Microbiology: Recent Results (from the past 240 hour(s))  MRSA PCR Screening     Status: Abnormal   Collection Time:  10/05/17  7:00 PM  Result Value Ref Range Status   MRSA by PCR POSITIVE (A) NEGATIVE Final    Comment:        The GeneXpert MRSA Assay (FDA approved for NASAL specimens only), is one component of a comprehensive MRSA colonization surveillance program. It is not intended to diagnose MRSA infection nor to guide or monitor treatment for MRSA infections. RESULT CALLED TO, READ BACK BY AND VERIFIED WITH:  BONDURANT,R @ 2201 ON 10/05/17 BY JUW      Labs: Basic Metabolic Panel: Recent Labs  Lab 10/05/17 1026  NA 134*  K 4.0  CL 101  CO2 26  GLUCOSE 125*  BUN 16  CREATININE 0.97  CALCIUM 9.3   Liver Function Tests: Recent Labs  Lab 10/05/17 1026  AST 22  ALT 8*  ALKPHOS 50  BILITOT 0.6  PROT 7.1  ALBUMIN 3.9   No results for input(s): LIPASE, AMYLASE in the last 168 hours. Recent Labs  Lab 10/05/17 1026  AMMONIA 35   CBC: Recent Labs  Lab 10/05/17 1026  WBC 7.9  NEUTROABS 5.9  HGB 11.5*  HCT 34.1*  MCV 92.4  PLT 95*   Cardiac Enzymes: No results for input(s): CKTOTAL, CKMB, CKMBINDEX, TROPONINI in the last 168 hours. BNP: BNP (last 3 results) No results for input(s):  BNP in the last 8760 hours.  ProBNP (last 3 results) No results for input(s): PROBNP in the last 8760 hours.  CBG: Recent Labs  Lab 10/05/17 1023 10/05/17 1506 10/05/17 2100  GLUCAP 125* 130* 144*       Signed:  Chaya JanEstela Hernandez Acosta  [facility] Pager: [phone] 10/06/2017, 11:12 AM

## 2017-10-06 NOTE — [place] FL2 (Signed)
[place] MEDICAID FL2 LEVEL OF CARE SCREENING TOOL     IDENTIFICATION  Patient Name: Oscar Duke Birthdate: 09-May-1971 Sex: male Admission Date (Current Location): 10/05/2017  [facility] [place]   Facility and Address:  [facility],  [address], [place] [id number]      Provider Number: [id number]  Attending Physician Name and Address:  Philip AspenHernandez Acosta, Minerva EndsEstela*  Relative Name and Phone Number:  Elease Hashimoto(Patricia at [facility] [phone]    Current Level of Care: Hospital Recommended Level of Care: Other (Comment)(Home/Group Home) Prior Approval Number:    Date Approved/Denied:   PASRR Number:    Discharge Plan: Other (Comment)([facility]/Group home)    Current Diagnoses: Patient Active Problem List   Diagnosis Date Noted  . Somnolence 10/05/2017  . Adverse drug interaction with prescription medication 10/05/2017  . Hypertension   . Diabetes mellitus without complication (HCC)   . Bipolar 1 disorder (HCC)   . Seizures (HCC)   . Schizophrenia, acute (HCC) 05/14/2017    Orientation RESPIRATION BLADDER Height & Weight     Self, Time, Situation, Place  Normal Continent Weight: 264 lb 15.9 oz (120.2 kg) Height:  6\' 4"  (193 cm)  BEHAVIORAL SYMPTOMS/MOOD NEUROLOGICAL BOWEL NUTRITION STATUS      Continent Diet(See discharge form)  AMBULATORY STATUS COMMUNICATION OF NEEDS Skin   Independent Verbally Normal                       Personal Care Assistance Level of Assistance              Functional Limitations Info  Hearing, Sight, Speech Sight Info: Adequate Hearing Info: Adequate Speech Info: Adequate    SPECIAL CARE FACTORS FREQUENCY                       Contractures Contractures Info: Not present    Additional Factors Info  Allergies, Code Status, Psychotropic, Isolation Precautions Code Status Info: Full Allergies Info: Haldol, Inderal, Lithium Psychotropic Info: Depakote    Isolation Precautions Info: MSRA     Current Medications (10/06/2017):  This is the current hospital active medication list Current Facility-Administered Medications  Medication Dose Route Frequency Provider Last Rate Last Dose  . acetaminophen (TYLENOL) tablet 650 mg  650 mg Oral Q6H PRN Levie HeritageStinson, Jacob J, DO       Or  . acetaminophen (TYLENOL) suppository 650 mg  650 mg Rectal Q6H PRN Levie HeritageStinson, Jacob J, DO      . amLODipine (NORVASC) tablet 10 mg  10 mg Oral Daily Levie HeritageStinson, Jacob J, DO   10 mg at 10/06/17 1012  . benztropine (COGENTIN) tablet 1 mg  1 mg Oral BID Levie HeritageStinson, Jacob J, DO   1 mg at 10/06/17 1014  . Chlorhexidine Gluconate Cloth 2 % PADS 6 each  6 each Topical Q0600 Blount, Andi DevonXenia T, NP      . clonazePAM (KLONOPIN) tablet 2 mg  2 mg Oral TID PRN Levie HeritageStinson, Jacob J, DO   2 mg at 10/05/17 2200  . divalproex (DEPAKOTE) DR tablet 2,000 mg  2,000 mg Oral QHS Levie HeritageStinson, Jacob J, DO   2,000 mg at 10/05/17 2123  . enoxaparin (LOVENOX) injection 60 mg  60 mg Subcutaneous Q24H Levie HeritageStinson, Jacob J, DO   60 mg at 10/05/17 2122  . insulin detemir (LEVEMIR) injection 28 Units  28 Units Subcutaneous Daily Levie HeritageStinson, Jacob J, DO   28 Units at 10/06/17 1008  . lactulose (  CHRONULAC) 10 GM/15ML solution 20 g  20 g Oral Daily Levie HeritageStinson, Jacob J, DO   20 g at 10/06/17 1014  . levETIRAcetam (KEPPRA) tablet 1,000 mg  1,000 mg Oral QHS Levie HeritageStinson, Jacob J, DO   1,000 mg at 10/05/17 2123  . lisinopril (PRINIVIL,ZESTRIL) tablet 20 mg  20 mg Oral Daily Levie HeritageStinson, Jacob J, DO   20 mg at 10/06/17 1010  . mupirocin ointment (BACTROBAN) 2 % 1 application  1 application Nasal BID Blount, Andi DevonXenia T, NP      . ondansetron (ZOFRAN) tablet 4 mg  4 mg Oral Q6H PRN Levie HeritageStinson, Jacob J, DO       Or  . ondansetron [facility](ZOFRAN) injection 4 mg  4 mg Intravenous Q6H PRN Levie HeritageStinson, Jacob J, DO      . pantoprazole (PROTONIX) EC tablet 40 mg  40 mg Oral Daily Levie HeritageStinson, Jacob J, DO   40 mg at 10/06/17 1009  . propranolol (INDERAL) tablet 10 mg  10 mg Oral  BID Levie HeritageStinson, Jacob J, DO   10 mg at 10/06/17 1010  . senna-docusate (Senokot-S) tablet 1 tablet  1 tablet Oral QHS PRN Levie HeritageStinson, Jacob J, DO      . sodium chloride tablet 1 g  1 g Oral Daily Levie HeritageStinson, Jacob J, DO   1 g at 10/06/17 1014     Discharge Medications: Please see discharge summary for a list of discharge medications.  Relevant Imaging Results:  Relevant Lab Results:   Additional Information SSN: [id number]  Arvin Collardara W Shanzay Hepworth, [place]

## 2017-10-06 NOTE — Progress Notes (Signed)
Patient waiting on [facility] to pick him up around 2 PM.

## 2017-10-06 NOTE — Progress Notes (Signed)
CSW was consulted to provide assistance with pt's transportation return to group home [facility].  CSW spoke with Elease HashimotoPatricia with [facility] concerning pt returning to facility for 2:00pm today.  [facility] will provide transportation for pt to facility. RN can reach facility at [phone]. CSW is signing off as their is no other needs.

## 2017-10-07 LAB — HIV ANTIBODY (ROUTINE TESTING W REFLEX): HIV Screen 4th Generation wRfx: NONREACTIVE

## 2017-10-13 ENCOUNTER — Encounter (HOSPITAL_COMMUNITY): Payer: Self-pay | Admitting: Cardiology

## 2017-10-13 ENCOUNTER — Emergency Department (HOSPITAL_COMMUNITY)
Admission: EM | Admit: 2017-10-13 | Discharge: 2017-10-13 | Disposition: A | Discharge status: Home / Self Care | Payer: Medicare Other | Attending: Emergency Medicine | Admitting: Emergency Medicine

## 2017-10-13 DIAGNOSIS — F209 Schizophrenia, unspecified: Secondary | ICD-10-CM | POA: Insufficient documentation

## 2017-10-13 DIAGNOSIS — I10 Essential (primary) hypertension: Secondary | ICD-10-CM | POA: Insufficient documentation

## 2017-10-13 DIAGNOSIS — Z8659 Personal history of other mental and behavioral disorders: Secondary | ICD-10-CM

## 2017-10-13 DIAGNOSIS — R4182 Altered mental status, unspecified: Secondary | ICD-10-CM | POA: Insufficient documentation

## 2017-10-13 DIAGNOSIS — Z794 Long term (current) use of insulin: Secondary | ICD-10-CM | POA: Diagnosis not present

## 2017-10-13 DIAGNOSIS — Z79899 Other long term (current) drug therapy: Secondary | ICD-10-CM | POA: Insufficient documentation

## 2017-10-13 DIAGNOSIS — F319 Bipolar disorder, unspecified: Secondary | ICD-10-CM | POA: Diagnosis not present

## 2017-10-13 DIAGNOSIS — E119 Type 2 diabetes mellitus without complications: Secondary | ICD-10-CM | POA: Insufficient documentation

## 2017-10-13 LAB — COMPREHENSIVE METABOLIC PANEL
ALBUMIN: 4 g/dL (ref 3.5–5.0)
ALK PHOS: 51 U/L (ref 38–126)
ALT: 9 U/L — ABNORMAL LOW (ref 17–63)
ANION GAP: 8 (ref 5–15)
AST: 15 U/L (ref 15–41)
BUN: 20 mg/dL (ref 6–20)
CALCIUM: 9.4 mg/dL (ref 8.9–10.3)
CO2: 26 mmol/L (ref 22–32)
Chloride: 102 mmol/L (ref 101–111)
Creatinine, Ser: 0.93 mg/dL (ref 0.61–1.24)
GFR calc Af Amer: >60 mL/min (ref >60)
GFR calc non Af Amer: >60 mL/min (ref >60)
GLUCOSE: 105 mg/dL — AB (ref 65–99)
POTASSIUM: 4.3 mmol/L (ref 3.5–5.1)
SODIUM: 136 mmol/L (ref 135–145)
Total Bilirubin: 0.6 mg/dL (ref 0.3–1.2)
Total Protein: 7.5 g/dL (ref 6.5–8.1)

## 2017-10-13 LAB — CBC
HCT: 33.9 % — ABNORMAL LOW (ref 39.0–52.0)
HEMOGLOBIN: 11.3 g/dL — AB (ref 13.0–17.0)
MCH: 30.8 pg (ref 26.0–34.0)
MCHC: 33.3 g/dL (ref 30.0–36.0)
MCV: 92.4 fL (ref 78.0–100.0)
Platelets: 139 10*3/uL — ABNORMAL LOW (ref 150–400)
RBC: 3.67 MIL/uL — ABNORMAL LOW (ref 4.22–5.81)
RDW: 14.1 % (ref 11.5–15.5)
WBC: 10.7 10*3/uL — ABNORMAL HIGH (ref 4.0–10.5)

## 2017-10-13 LAB — RAPID URINE DRUG SCREEN, HOSP PERFORMED
Amphetamines: NOT DETECTED
BARBITURATES: NOT DETECTED
Benzodiazepines: POSITIVE — AB
COCAINE: NOT DETECTED
Opiates: NOT DETECTED
TETRAHYDROCANNABINOL: NOT DETECTED

## 2017-10-13 LAB — VALPROIC ACID LEVEL: Valproic Acid Lvl: 91 ug/mL (ref 50.0–100.0)

## 2017-10-13 MED ORDER — CLONAZEPAM 1 MG PO TABS: 1.0000 mg | ORAL_TABLET | Freq: Three times a day (TID) | ORAL | Status: DC | PRN

## 2017-10-13 NOTE — ED Provider Notes (Signed)
[facility] Provider Note   CSN: [id number] Arrival date & time: 10/13/17  1243     History   Chief Complaint Chief Complaint  Patient presents with  . Altered Mental Status    HPI Oscar Duke is a 46 y.o. male.  Patient with hx dm, bipolar, schizophrenia, presents from ECF with confusion, and generalized weakness. Patient is awake and alert on arrival to ED.  Denies specific physical complaint.   Pt very limited historian - level 5 caveat.  No report of fevers. No report of med change. No seizure.   The history is provided by the patient and the EMS personnel. The history is limited by the condition of the patient.  Altered Mental Status   Associated symptoms include confusion.    Past Medical History:  Diagnosis Date  . Bipolar 1 disorder (HCC)   . Diabetes mellitus without complication (HCC)   . Hyperlipidemia   . Hypertension   . 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 Schizophrenia, acute (HCC)   . Seizures [facility](HCC)     Patient Active Problem List   Diagnosis Date Noted  . Somnolence 10/05/2017  . Adverse drug interaction with prescription medication 10/05/2017  . Hypertension   . Diabetes mellitus without complication (HCC)   . Bipolar 1 disorder (HCC)   . Seizures (HCC)   . Schizophrenia, acute (HCC) 05/14/2017    Past Surgical History:  Procedure Laterality Date  . JOINT REPLACEMENT         Home Medications    Prior to Admission medications   Medication Sig Start Date End Date Taking? Authorizing Provider  amLODipine (NORVASC) 10 MG tablet Take 10 mg by mouth daily.    [provider]  benztropine (COGENTIN) 1 MG tablet Take 1 mg by mouth 2 (two) times daily.     [provider]  Cholecalciferol (VITAMIN D3) 2000 units TABS Take 2,000 Units by mouth daily.     [provider]  clonazePAM (KLONOPIN) 2 MG tablet Take 2 mg by mouth 3 (three) times daily as needed for anxiety.     [provider]  divalproex (DEPAKOTE) 500 MG DR tablet Take 2,000 mg by mouth at bedtime.    [provider]  insulin detemir (LEVEMIR) 100 UNIT/ML injection Inject 28 Units into the skin daily.    [provider]  Lactulose 20 GM/30ML SOLN Take 30 mLs by mouth daily.    [provider]  levETIRAcetam (KEPPRA) 1000 MG tablet Take 1,000 mg by mouth at bedtime.    [provider]  lisinopril (PRINIVIL,ZESTRIL) 20 MG tablet Take 20 mg by mouth daily.    [provider]  omeprazole (PRILOSEC) 20 MG capsule Take 20 mg by mouth daily.    [provider]  perphenazine (TRILAFON) 8 MG tablet Take 4 mg by mouth 2 (two) times daily. 1 tab at bedtime    [provider]  propranolol (INDERAL) 10 MG tablet Take 10 mg by mouth 2 (two) times daily.    [provider]  sodium chloride 1 g tablet Take 1 g by mouth daily.    [provider]  traZODone (DESYREL) 150 MG tablet Take by mouth at bedtime.    [provider]    Family History History reviewed. No pertinent family history.  Social History Social History   Tobacco Use  . Smoking status: Never Smoker  . Smokeless tobacco: Never Used  Substance Use Topics  . Alcohol  use: No  . Drug use: Yes    Types: Marijuana    Comment: quit 5 years ago     Allergies   Haldol [haloperidol lactate]; Inderal [propranolol]; and Lithium   Review of Systems Review of Systems  Unable to perform ROS: Mental status change  Constitutional: Negative for fever.  Cardiovascular: Negative for chest pain.  Gastrointestinal: Negative for vomiting.  Neurological: Negative for headaches.  Psychiatric/Behavioral: Positive for confusion.  level 5 caveat, re psych illness, mental status change.    Physical Exam Updated Vital Signs BP 103/64   Pulse 82   Temp 98 F (36.7 C) (Oral)   Resp 16   SpO2 98%   Physical Exam  Constitutional: He appears well-developed  and well-nourished. No distress.  HENT:  Head: Atraumatic.  Mouth/Throat: Oropharynx is clear and moist.  No facial or scalp sts, tenderness or bruising.   Eyes: Conjunctivae are normal. Pupils are equal, round, and reactive to light.  Neck: Neck supple. No tracheal deviation present. No thyromegaly present.  No stiffness or rigidity.   Cardiovascular: Normal rate, regular rhythm, normal heart sounds and intact distal pulses.  Pulmonary/Chest: Effort normal and breath sounds normal. No accessory muscle usage. No respiratory distress.  Abdominal: Soft. Bowel sounds are normal. He exhibits no distension. There is no tenderness.  Genitourinary:  Genitourinary Comments: Normal external gu exam.   Musculoskeletal: He exhibits no edema.  CTLS spine, non tender, aligned, no step off. Pt moves bilateral extremities purposefully without apparent pain or discomfort. No focal bony tenderness.   Neurological: He is alert.  Awake and alert. Speaking. Responds to some questions. Is confused.  Moves bil extremities purposefully w good strength.   Skin: Skin is warm and dry. He is not diaphoretic.  Psychiatric: He has a normal mood and affect.  Nursing note and vitals reviewed.    ED Treatments / Results  Labs (all labs ordered are listed, but only abnormal results are displayed) Labs Reviewed - No data to display  EKG  EKG Interpretation  Date/Time:  Sunday October 13 2017 12:54:23 EST Ventricular Rate:  80 PR Interval:    QRS Duration: 89 QT Interval:  362 QTC Calculation: 418 R Axis:   79 Text Interpretation:  Sinus rhythm ST elev, probable normal early repol pattern Confirmed by Linwood DibblesKnapp, Jon [phone] on 10/13/2017 1:03:02 PM       Radiology No results found.  Procedures Procedures (including critical care time)  Medications Ordered in ED Medications - No data to display   Initial Impression / Assessment and Plan / ED Course  I have reviewed the triage vital signs and the  nursing notes.  Pertinent labs & imaging results that were available during my care of the patient were reviewed by me and considered in my medical decision making (see chart for details).  Labs sent.  Reviewed nursing notes and prior charts for additional history.   Patient with very recent admission with similar symptoms - pt w recent head ct neg, ammonia level normal.  Records indicate possibly felt due to medication/medication side effects.   Recheck, pt alert and content. No distress. Vital signs normal.  Patient currently appears stable for d/c.   rec pcp f/u tomorrow to assess/reassess meds/doses.     Final Clinical Impressions(s) / ED Diagnoses   Final diagnoses:  None    ED Discharge Orders    None       Cathren LaineSteinl, Aahna Rossa, MD 10/13/17 1527

## 2017-10-13 NOTE — ED Notes (Signed)
Pt caretaker at [facility], Alcario Droughtrica [phone], notified of pt disposition for discharge and states they will send transportation.

## 2017-10-13 NOTE — Discharge Instructions (Addendum)
It was our pleasure to provide your ER care today - we hope that you feel better.  It is noted that several of your medications have sedating side effects.  Decrease the dose of your clonopin for 2 mg 3x/day to 1 mg 3x/day.  Follow up with your primary care doctor in the next 1-2 days for recheck - discuss medications, and possible adjustment of medications then.   Return to ER if worse, new symptoms, fevers, trouble breathing, new or severe pain, other concern.

## 2017-10-13 NOTE — ED Notes (Addendum)
Contacted [facility] regarding transportation back to residence, voicemail reached. Contacted facility and staff reported would have to call [facility] ED back.  Facility called back a few minutes later and reported transportation approximately 30 minutes away from [facility] ED.

## 2017-10-13 NOTE — ED Notes (Signed)
Spoke with [facility] staff.  And per staff pt has been confused for 2 days.  Staff also states that pt has been incontinent of stool and urine-- staff states that pt is normally ambulatory to bathroom.  Also states that pt fell next to the bed this morning.

## 2017-10-13 NOTE — ED Notes (Signed)
Erica from [facility] called and stated d/c transport is approximately 30 mins away.

## 2017-10-13 NOTE — ED Notes (Signed)
Pt confused.  Attempting to get out of bed.  States he has to go play.  Moved pt to a closer room and avasyst placed in room and activated.

## 2017-10-13 NOTE — ED Triage Notes (Signed)
EMS called out for fall.  Pt denies fall.  Pt states he has not felt like himself for  2 days.  Seen here in ER recently.

## 2017-10-13 NOTE — ED Triage Notes (Signed)
Pt sluggish to answer questions.  Not oriented to time or place.

## 2017-10-21 ENCOUNTER — Ambulatory Visit: Payer: Medicaid Other | Admitting: Podiatry

## 2017-11-03 ENCOUNTER — Emergency Department (HOSPITAL_COMMUNITY)
Admission: EM | Admit: 2017-11-03 | Discharge: 2017-11-03 | Disposition: A | Discharge status: Home / Self Care | Payer: Medicare Other | Attending: Emergency Medicine | Admitting: Emergency Medicine

## 2017-11-03 ENCOUNTER — Encounter (HOSPITAL_COMMUNITY): Payer: Self-pay

## 2017-11-03 DIAGNOSIS — I10 Essential (primary) hypertension: Secondary | ICD-10-CM | POA: Diagnosis not present

## 2017-11-03 DIAGNOSIS — R569 Unspecified convulsions: Secondary | ICD-10-CM | POA: Insufficient documentation

## 2017-11-03 DIAGNOSIS — Z794 Long term (current) use of insulin: Secondary | ICD-10-CM | POA: Insufficient documentation

## 2017-11-03 DIAGNOSIS — Z79899 Other long term (current) drug therapy: Secondary | ICD-10-CM | POA: Insufficient documentation

## 2017-11-03 DIAGNOSIS — E785 Hyperlipidemia, unspecified: Secondary | ICD-10-CM | POA: Diagnosis not present

## 2017-11-03 DIAGNOSIS — E119 Type 2 diabetes mellitus without complications: Secondary | ICD-10-CM | POA: Insufficient documentation

## 2017-11-03 LAB — BASIC METABOLIC PANEL
Anion gap: 11 (ref 5–15)
BUN: 11 mg/dL (ref 6–20)
CALCIUM: 9.2 mg/dL (ref 8.9–10.3)
CHLORIDE: 108 mmol/L (ref 101–111)
CO2: 24 mmol/L (ref 22–32)
CREATININE: 0.88 mg/dL (ref 0.61–1.24)
GFR calc Af Amer: >60 mL/min (ref >60)
GFR calc non Af Amer: >60 mL/min (ref >60)
GLUCOSE: 154 mg/dL — AB (ref 65–99)
Potassium: 4.3 mmol/L (ref 3.5–5.1)
Sodium: 143 mmol/L (ref 135–145)

## 2017-11-03 LAB — VALPROIC ACID LEVEL: Valproic Acid Lvl: <10 ug/mL — ABNORMAL LOW (ref 50.0–100.0)

## 2017-11-03 LAB — CBC WITH DIFFERENTIAL/PLATELET
Basophils Absolute: 0 K/uL (ref 0.0–0.1)
Basophils Relative: 0 %
Eosinophils Absolute: 0.1 K/uL (ref 0.0–0.7)
Eosinophils Relative: 2 %
HCT: 35.6 % — ABNORMAL LOW (ref 39.0–52.0)
Hemoglobin: 11.7 g/dL — ABNORMAL LOW (ref 13.0–17.0)
Lymphocytes Relative: 25 %
Lymphs Abs: 2.1 K/uL (ref 0.7–4.0)
MCH: 30.4 pg (ref 26.0–34.0)
MCHC: 32.9 g/dL (ref 30.0–36.0)
MCV: 92.5 fL (ref 78.0–100.0)
Monocytes Absolute: 0.5 K/uL (ref 0.1–1.0)
Monocytes Relative: 6 %
Neutro Abs: 5.8 K/uL (ref 1.7–7.7)
Neutrophils Relative %: 67 %
Platelets: 187 K/uL (ref 150–400)
RBC: 3.85 MIL/uL — ABNORMAL LOW (ref 4.22–5.81)
RDW: 14.6 % (ref 11.5–15.5)
WBC: 8.5 K/uL (ref 4.0–10.5)

## 2017-11-03 MED ORDER — SODIUM CHLORIDE 0.9 % IV BOLUS (SEPSIS)
1000.0000 mL | Freq: Once | INTRAVENOUS | Status: AC
  Administered 2017-11-03: 1000 mL via INTRAVENOUS

## 2017-11-03 NOTE — ED Triage Notes (Signed)
[facility].  Staff told ems pt had a seizure that lasted approx .  Pt c/o back pain.  Reports has chronic back pain.

## 2017-11-03 NOTE — Discharge Instructions (Signed)
Depakote level was low.  Please make sure that patient is taking his medication correctly.

## 2017-11-03 NOTE — ED Notes (Signed)
Pt refused IV.  Explained to pt that it is protocol to obtain IV access and draw bloodwork.  Explained IV access is important incase he has another seizure.  Pt says we can draw blood work but refuses IV.

## 2017-11-03 NOTE — ED Notes (Signed)
[facility] called to pick up pt

## 2017-11-03 NOTE — ED Provider Notes (Signed)
[facility] Provider Note   CSN: [id number] Arrival date & time: 11/03/17  1531     History   Chief Complaint Chief Complaint  Patient presents with  . Seizures    HPI Oscar Duke is a 46 y.o. male.  Level 5 caveat for psychiatric illness.  Patient allegedly had a seizure tonight at his group home.  No other details are known of the event.  He has a known seizure disorder.  His behavior is not congruent with postictal behavior in the emergency department.  Past medical history includes bipolar, schizophrenia, seizures, hypertension, hyperlipidemia, diabetes.      Past Medical History:  Diagnosis Date  . Bipolar 1 disorder (HCC)   . Diabetes mellitus without complication (HCC)   . Hyperlipidemia   . Hypertension   . 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 Schizophrenia, acute (HCC)   . Seizures [facility](HCC)     Patient Active Problem List   Diagnosis Date Noted  . Somnolence 10/05/2017  . Adverse drug interaction with prescription medication 10/05/2017  . Hypertension   . Diabetes mellitus without complication (HCC)   . Bipolar 1 disorder (HCC)   . Seizures (HCC)   . Schizophrenia, acute (HCC) 05/14/2017    Past Surgical History:  Procedure Laterality Date  . JOINT REPLACEMENT         Home Medications    Prior to Admission medications   Medication Sig Start Date End Date Taking? Authorizing Provider  amLODipine (NORVASC) 10 MG tablet Take 10 mg by mouth daily.   Yes [provider]  atorvastatin (LIPITOR) 40 MG tablet Take 40 mg by mouth every evening.   Yes [provider]  benztropine (COGENTIN) 1 MG tablet Take 1 mg by mouth 2 (two) times daily.    Yes [provider]  Cholecalciferol (VITAMIN D3) 2000 units TABS Take 2,000 Units by mouth daily.    Yes [provider]  clonazePAM (KLONOPIN) 1 MG tablet Take 1 tablet (1 mg total) by mouth 3 (three) times daily as needed for  anxiety. Patient taking differently: Take 1 mg by mouth 3 (three) times daily.  10/13/17  Yes Cathren LaineSteinl, Kevin, MD  divalproex (DEPAKOTE) 500 MG DR tablet Take 2,000 mg by mouth at bedtime.   Yes [provider]  hydrOXYzine (VISTARIL) 50 MG capsule Take 50 mg by mouth every 4 (four) hours as needed.   Yes [provider]  insulin detemir (LEVEMIR) 100 UNIT/ML injection Inject 28 Units into the skin daily.   Yes [provider]  Lactulose 20 GM/30ML SOLN Take 30 mLs by mouth daily.   Yes [provider]  levETIRAcetam (KEPPRA) 1000 MG tablet Take 1,000 mg by mouth at bedtime.   Yes [provider]  lisinopril (PRINIVIL,ZESTRIL) 20 MG tablet Take 20 mg by mouth daily.   Yes [provider]  omeprazole (PRILOSEC) 20 MG capsule Take 20 mg by mouth daily.   Yes [provider]  perphenazine (TRILAFON) 8 MG tablet Take 4-8 mg by mouth 3 (three) times daily. 4mg  twice daily and 8mg  tab at bedtime   Yes [provider]  propranolol (INDERAL) 10 MG tablet Take 10 mg by mouth 2 (two) times daily.   Yes [provider]  sodium chloride 1 g tablet Take 1 g by mouth daily.   Yes [provider]  traZODone (DESYREL) 150 MG tablet Take 300 mg by mouth at bedtime.    Yes [provider]  Family History No family history on file.  Social History Social History   Tobacco Use  . Smoking status: Never Smoker  . Smokeless tobacco: Never Used  Substance Use Topics  . Alcohol use: No  . Drug use: Yes    Types: Marijuana    Comment: quit 5 years ago     Allergies   Haldol [haloperidol lactate]; Inderal [propranolol]; and Lithium   Review of Systems Review of Systems  Unable to perform ROS: Psychiatric disorder     Physical Exam Updated Vital Signs BP 126/87   Pulse 85   Temp 98 F (36.7 C) (Oral)   Resp (!) 21   Ht 6\' 6"WtuEuHeeCW$  (1.981 m)   BMI 30.62 kg/m   Physical Exam  Constitutional: He is  oriented to person, place, and time.  Patient is ambulatory without neurological deficits.  HENT:  Head: Normocephalic and atraumatic.  Eyes: Conjunctivae are normal.  Neck: Neck supple.  Cardiovascular: Normal rate and regular rhythm.  Pulmonary/Chest: Effort normal and breath sounds normal.  Abdominal: Soft. Bowel sounds are normal.  Musculoskeletal: Normal range of motion.  Neurological: He is alert and oriented to person, place, and time.  Skin: Skin is warm and dry.  Psychiatric:  Flat affect, flight of ideas.  Nursing note and vitals reviewed.    ED Treatments / Results  Labs (all labs ordered are listed, but only abnormal results are displayed) Labs Reviewed  CBC WITH DIFFERENTIAL/PLATELET - Abnormal; Notable for the following components:      Result Value   RBC 3.85 (*)    Hemoglobin 11.7 (*)    HCT 35.6 (*)    All other components within normal limits  BASIC METABOLIC PANEL - Abnormal; Notable for the following components:   Glucose, Bld 154 (*)    All other components within normal limits  VALPROIC ACID LEVEL - Abnormal; Notable for the following components:   Valproic Acid Lvl <10 (*)    All other components within normal limits    EKG  EKG Interpretation  Date/Time:  Sunday November 03 2017 15:42:50 EST Ventricular Rate:  86 PR Interval:    QRS Duration: 90 QT Interval:  378 QTC Calculation: 453 R Axis:   82 Text Interpretation:  Sinus rhythm since last tracing no significant change Confirmed by Mancel BaleWentz, Elliott [phone]([id number]) on 11/03/2017 4:00:46 PM       Radiology No results found.  Procedures Procedures (including critical care time)  Medications Ordered in ED Medications  sodium chloride 0.9 % bolus 1,000 mL (1,000 mLs Intravenous New Bag/Given 11/03/17 1720)     Initial Impression / Assessment and Plan / ED Course  I have reviewed the triage vital signs and the nursing notes.  Pertinent labs & imaging results that were available during my  care of the patient were reviewed by me and considered in my medical decision making (see chart for details).     I am uncertain whether patient had a seizure tonight or not.  He is not exhibiting any post ictal behavior.  Depakote level is suboptimal, but I am not sure if he is taking his medicine correctly.  He has been encouraged to take his Depakote as prescribed.  He was observed for approximately 2 hours with no seizure activity noted  Final Clinical Impressions(s) / ED Diagnoses   Final diagnoses:  Seizure [facility](HCC)    ED Discharge Orders    None       Donnetta Hutchingook, Franchelle Foskett, MD 11/03/17 2142

## 2018-03-12 ENCOUNTER — Emergency Department (HOSPITAL_COMMUNITY)
Admission: EM | Admit: 2018-03-12 | Discharge: 2018-03-12 | Disposition: A | Discharge status: Nursing Home (Includes ICF) | Payer: Medicare Other | Attending: Emergency Medicine | Admitting: Emergency Medicine

## 2018-03-12 ENCOUNTER — Encounter (HOSPITAL_COMMUNITY): Payer: Self-pay | Admitting: Emergency Medicine

## 2018-03-12 ENCOUNTER — Other Ambulatory Visit: Payer: Self-pay

## 2018-03-12 ENCOUNTER — Emergency Department (HOSPITAL_COMMUNITY): Payer: Medicare Other

## 2018-03-12 DIAGNOSIS — Z794 Long term (current) use of insulin: Secondary | ICD-10-CM | POA: Insufficient documentation

## 2018-03-12 DIAGNOSIS — Z79899 Other long term (current) drug therapy: Secondary | ICD-10-CM | POA: Diagnosis not present

## 2018-03-12 DIAGNOSIS — I10 Essential (primary) hypertension: Secondary | ICD-10-CM | POA: Diagnosis not present

## 2018-03-12 DIAGNOSIS — E119 Type 2 diabetes mellitus without complications: Secondary | ICD-10-CM | POA: Diagnosis not present

## 2018-03-12 DIAGNOSIS — M545 Low back pain, unspecified: Secondary | ICD-10-CM

## 2018-03-12 MED ORDER — CYCLOBENZAPRINE HCL 10 MG PO TABS: 10.0000 mg | ORAL_TABLET | Freq: Three times a day (TID) | ORAL | 0 refills | Status: DC | PRN

## 2018-03-12 MED ORDER — IBUPROFEN 800 MG PO TABS: 800.0000 mg | ORAL_TABLET | Freq: Four times a day (QID) | ORAL | 0 refills | Status: DC | PRN

## 2018-03-12 MED ORDER — IBUPROFEN 800 MG PO TABS
800.0000 mg | ORAL_TABLET | Freq: Once | ORAL | Status: AC
  Administered 2018-03-12: 800 mg via ORAL
  Filled 2018-03-12: qty 1

## 2018-03-12 NOTE — Discharge Instructions (Addendum)
You may apply ice on and off to your back.  Take the medication as directed.  Follow-up with your primary doctor for recheck if the pain is not improving in a few days.

## 2018-03-12 NOTE — ED Notes (Signed)
Per Lyman Bishop, Pt competent to wait in waiting room without supervision  for them to pick him up.  Informed pt it would be around 2pm before they could arrive.  Meal tray ordered for pt.

## 2018-03-12 NOTE — ED Triage Notes (Signed)
Pt fell getting out of the shower. Denies hitting head.  C/o of upper back pain

## 2018-03-12 NOTE — ED Provider Notes (Signed)
[facility] EMERGENCY DEPARTMENT Provider Note   CSN: [id number] Arrival date & time: 03/12/18  9562     History   Chief Complaint Chief Complaint  Patient presents with  . Back Pain    HPI Oscar Duke is a 47 y.o. male.  HPI   Oscar Duke is a 47 y.o. male who presents to the Emergency Department complaining of low back pain secondary to a fall that occurred this morning.  He states that he slipped while taking a shower this morning and fell outside the bathtub landing on his back.  He complains of pain across his lower back that is worse with standing and walking.  Pain improves at rest.  He also complains of pain to his right shoulder and upper arm.  He denies neck pain, headache, LOC, or head injury.  He is not taking any medication or tried any therapies prior to arrival.  He denies pain to his abdomen, numbness or weakness of his lower extremities, urine or bowel changes or recent illness.   Past Medical History:  Diagnosis Date  . Bipolar 1 disorder (HCC)   . Diabetes mellitus without complication (HCC)   . Hyperlipidemia   . Hypertension   . 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 Schizophrenia, acute (HCC)   . Seizures [facility])     Patient Active Problem List   Diagnosis Date Noted  . Somnolence 10/05/2017  . Adverse drug interaction with prescription medication 10/05/2017  . Hypertension   . Diabetes mellitus without complication (HCC)   . Bipolar 1 disorder (HCC)   . Seizures (HCC)   . Schizophrenia, acute (HCC) 05/14/2017    Past Surgical History:  Procedure Laterality Date  . JOINT REPLACEMENT       Home Medications    Prior to Admission medications   Medication Sig Start Date End Date Taking? Authorizing Provider  amLODipine (NORVASC) 10 MG tablet Take 10 mg by mouth daily.    [provider]  atorvastatin (LIPITOR) 40 MG tablet Take 40 mg by mouth every evening.    [provider]  benztropine (COGENTIN) 1 MG  tablet Take 1 mg by mouth 2 (two) times daily.     [provider]  Cholecalciferol (VITAMIN D3) 2000 units TABS Take 2,000 Units by mouth daily.     [provider]  clonazePAM (KLONOPIN) 1 MG tablet Take 1 tablet (1 mg total) by mouth 3 (three) times daily as needed for anxiety. Patient taking differently: Take 1 mg by mouth 3 (three) times daily.  10/13/17   Cathren Laine, MD  divalproex (DEPAKOTE) 500 MG DR tablet Take 2,000 mg by mouth at bedtime.    [provider]  hydrOXYzine (VISTARIL) 50 MG capsule Take 50 mg by mouth every 4 (four) hours as needed.    [provider]  insulin detemir (LEVEMIR) 100 UNIT/ML injection Inject 28 Units into the skin daily.    [provider]  Lactulose 20 GM/30ML SOLN Take 30 mLs by mouth daily.    [provider]  levETIRAcetam (KEPPRA) 1000 MG tablet Take 1,000 mg by mouth at bedtime.    [provider]  lisinopril (PRINIVIL,ZESTRIL) 20 MG tablet Take 20 mg by mouth daily.    [provider]  omeprazole (PRILOSEC) 20 MG capsule Take 20 mg by mouth daily.    [provider]  perphenazine (TRILAFON) 8 MG tablet Take 4-8 mg by mouth 3 (three) times daily.  twice daily and   tab at bedtime    [provider]  propranolol (INDERAL) 10 MG tablet Take 10 mg by mouth 2 (two) times daily.    [provider]  sodium chloride 1 g tablet Take 1 g by mouth daily.    [provider]  traZODone (DESYREL) 150 MG tablet Take 300 mg by mouth at bedtime.     [provider]    Family History History reviewed. No pertinent family history.  Social History Social History   Tobacco Use  . Smoking status: Never Smoker  . Smokeless tobacco: Never Used  Substance Use Topics  . Alcohol use: No  . Drug use: Yes    Types: Marijuana    Comment: quit 5 years ago     Allergies   Haldol [haloperidol lactate]; Inderal [propranolol]; Januvia [sitagliptin];  and Lithium   Review of Systems Review of Systems  Constitutional: Negative for fever.  Respiratory: Negative for chest tightness and shortness of breath.   Cardiovascular: Negative for chest pain.  Gastrointestinal: Negative for abdominal pain, constipation and vomiting.  Genitourinary: Negative for decreased urine volume, difficulty urinating, dysuria, flank pain and hematuria.  Musculoskeletal: Positive for arthralgias (Right shoulder and arm upper arm pain) and back pain. Negative for joint swelling and neck pain.  Skin: Negative for rash and wound.  Neurological: Negative for dizziness, syncope, weakness, numbness and headaches.  All other systems reviewed and are negative.    Physical Exam Updated Vital Signs BP 127/78 (BP Location: Right Arm)   Pulse 81   Temp (!) 97.1 F (36.2 C) (Oral)   Resp 18   Ht  (1.956 m)   Wt 116.6 kg (257 lb)   SpO2 98%   BMI 30.48 kg/m   Physical Exam  Constitutional: He is oriented to person, place, and time. He appears well-developed and well-nourished. No distress.  HENT:  Head: Normocephalic and atraumatic.  Mouth/Throat: Oropharynx is clear and moist.  Eyes: Pupils are equal, round, and reactive to light. EOM are normal.  Neck: Normal range of motion. Neck supple.  Cardiovascular: Normal rate, regular rhythm and intact distal pulses.  DP pulses are strong and palpable bilaterally  Pulmonary/Chest: Effort normal and breath sounds normal. No respiratory distress.  Abdominal: Soft. He exhibits no distension. There is no tenderness.  Musculoskeletal: He exhibits tenderness. He exhibits no edema.       Lumbar back: He exhibits tenderness and pain. He exhibits normal range of motion, no swelling, no deformity, no laceration and normal pulse.  Diffuse ttp of the lower bilateral lumbar paraspinal muscles.  No spinal tenderness.  No ecchymosis or abrasions.  Pt has 5/5 strength against resistance of bilateral lower extremities.       Neurological: He is alert and oriented to person, place, and time. He has normal strength. No sensory deficit. He exhibits normal muscle tone. Coordination and gait normal.  Reflex Scores:      Patellar reflexes are 2+ on the right side and 2+ on the left side.      Achilles reflexes are 2+ on the right side and 2+ on the left side. Skin: Skin is warm and dry. Capillary refill takes less than 2 seconds. No rash noted.  Nursing note and vitals reviewed.    ED Treatments / Results  Labs (all labs ordered are listed, but only abnormal results are displayed) Labs Reviewed - No data to display  EKG None  Radiology Dg Lumbar Spine Complete  Result Date: 03/12/2018 CLINICAL DATA:  Acute lower back pain after falling today at home. EXAM: LUMBAR SPINE - COMPLETE 4+ VIEW COMPARISON:  None. FINDINGS: No fracture or spondylolisthesis is noted. Mild degenerative disc disease is noted at L1-2. Anterior osteophyte formation is noted at L3-4 and L4-5. Posterior facet joints are unremarkable. IMPRESSION: Mild degenerative changes as described above. No acute abnormality seen in the lumbar spine. Electronically Signed   By: Lupita Raider, M.D.   On: 03/12/2018 10:54    Procedures Procedures (including critical care time)  Medications Ordered in ED Medications  ibuprofen (ADVIL,MOTRIN) tablet 800 mg (800 mg Oral Given 03/12/18 1014)     Initial Impression / Assessment and Plan / ED Course  I have reviewed the triage vital signs and the nursing notes.  Pertinent labs & imaging results that were available during my care of the patient were reviewed by me and considered in my medical decision making (see chart for details).     Patient is well-appearing.  No focal neuro deficits on exam.  Patient is ambulatory in the department with a steady gait.  Has been walking around in the dept, to the nursing desk multiple times wanting food and something to drink.  On exam, he is lying on the stretcher with  one leg crossed over his knee smiling and talking.  No concerning symptoms for cauda equina, likely sprain.  Patient agrees to treatment plan with ice, muscle relaxer and NSAID therapy.  Return precautions discussed. Pt guardian notified.    Final Clinical Impressions(s) / ED Diagnoses   Final diagnoses:  Acute bilateral low back pain without sciatica    ED Discharge Orders        Ordered    ibuprofen (ADVIL,MOTRIN) 800 MG tablet  Every 6 hours PRN     03/12/18 1121    cyclobenzaprine (FLEXERIL) 10 MG tablet  3 times daily PRN     03/12/18 1121       Pauline Aus, PA-C 03/13/18 1028    Donnetta Hutching, MD 03/14/18 [phone]

## 2018-05-02 ENCOUNTER — Other Ambulatory Visit: Payer: Self-pay

## 2018-05-02 ENCOUNTER — Emergency Department (HOSPITAL_COMMUNITY)
Admission: EM | Admit: 2018-05-02 | Discharge: 2018-05-03 | Disposition: A | Discharge status: Home / Self Care | Payer: Medicare Other | Attending: Emergency Medicine | Admitting: Emergency Medicine

## 2018-05-02 ENCOUNTER — Encounter (HOSPITAL_COMMUNITY): Payer: Self-pay | Admitting: *Deleted

## 2018-05-02 DIAGNOSIS — R34 Anuria and oliguria: Secondary | ICD-10-CM | POA: Diagnosis not present

## 2018-05-02 DIAGNOSIS — E119 Type 2 diabetes mellitus without complications: Secondary | ICD-10-CM | POA: Diagnosis not present

## 2018-05-02 DIAGNOSIS — I10 Essential (primary) hypertension: Secondary | ICD-10-CM | POA: Diagnosis not present

## 2018-05-02 DIAGNOSIS — Z794 Long term (current) use of insulin: Secondary | ICD-10-CM | POA: Insufficient documentation

## 2018-05-02 DIAGNOSIS — Z79899 Other long term (current) drug therapy: Secondary | ICD-10-CM | POA: Insufficient documentation

## 2018-05-02 DIAGNOSIS — R339 Retention of urine, unspecified: Secondary | ICD-10-CM | POA: Diagnosis present

## 2018-05-02 LAB — URINALYSIS, ROUTINE W REFLEX MICROSCOPIC
BILIRUBIN URINE: NEGATIVE
Glucose, UA: NEGATIVE mg/dL
HGB URINE DIPSTICK: NEGATIVE
KETONES UR: NEGATIVE mg/dL
Leukocytes, UA: NEGATIVE
Nitrite: NEGATIVE
PH: 6 (ref 5.0–8.0)
Protein, ur: NEGATIVE mg/dL
Specific Gravity, Urine: 1.006 (ref 1.005–1.030)

## 2018-05-02 NOTE — ED Triage Notes (Signed)
[facility]  Pt brought in by [facility] due to urinary retention.  Pt reported that he could not void for "3-4 days".  Pt did void on arrival to the ER and gave a sample and states that he "feels much better".  Pt denies any pain at this time.  Per chart from AL facility pt has Bipolar and intellectual disability disorder.  Pt is alert and oriented at this time and appears in no distress.

## 2018-05-02 NOTE — ED Provider Notes (Signed)
[facility] EMERGENCY DEPARTMENT Provider Note   CSN: [id number] Arrival date & time: 05/02/18  1916     History   Chief Complaint Chief Complaint  Patient presents with  . Urinary Retention    HPI Oscar Duke is a 47 y.o. male.  Reportedly had decreased urine output recently.  No back pain or weakness in his legs.  No anesthesia.  Prior to my evaluating him he states he is urinated 5 times and spend department.  His bladder scan was done with a postvoid residual of less than 50.  Is on multiple psychiatric medications.     Past Medical History:  Diagnosis Date  . Bipolar 1 disorder (HCC)   . Diabetes mellitus without complication (HCC)   . Hyperlipidemia   . Hypertension   . 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 Schizophrenia, acute (HCC)   . Seizures [facility])     Patient Active Problem List   Diagnosis Date Noted  . Somnolence 10/05/2017  . Adverse drug interaction with prescription medication 10/05/2017  . Hypertension   . Diabetes mellitus without complication (HCC)   . Bipolar 1 disorder (HCC)   . Seizures (HCC)   . Schizophrenia, acute (HCC) 05/14/2017    Past Surgical History:  Procedure Laterality Date  . JOINT REPLACEMENT          Home Medications    Prior to Admission medications   Medication Sig Start Date End Date Taking? Authorizing Provider  amLODipine (NORVASC) 10 MG tablet Take 10 mg by mouth daily.    [provider]  atorvastatin (LIPITOR) 40 MG tablet Take 40 mg by mouth every evening.    [provider]  benztropine (COGENTIN) 1 MG tablet Take 1 mg by mouth 2 (two) times daily.     [provider]  Cholecalciferol (VITAMIN D3) 2000 units TABS Take 2,000 Units by mouth daily.     [provider]  clonazePAM (KLONOPIN) 1 MG tablet Take 1 tablet (1 mg total) by mouth 3 (three) times daily as needed for anxiety. Patient taking differently: Take 1 mg by mouth 3 (three) times daily.   10/13/17   Cathren Laine, MD  cyclobenzaprine (FLEXERIL) 10 MG tablet Take 1 tablet (10 mg total) by mouth 3 (three) times daily as needed. 03/12/18   Triplett, Tammy, PA-C  divalproex (DEPAKOTE) 500 MG DR tablet Take 2,000 mg by mouth at bedtime.    [provider]  hydrOXYzine (VISTARIL) 50 MG capsule Take 50 mg by mouth every 4 (four) hours as needed.    [provider]  ibuprofen (ADVIL,MOTRIN) 800 MG tablet Take 1 tablet (800 mg total) by mouth every 6 (six) hours as needed. Take with food 03/12/18   Triplett, Tammy, PA-C  insulin detemir (LEVEMIR) 100 UNIT/ML injection Inject 28 Units into the skin daily.    [provider]  Lactulose 20 GM/30ML SOLN Take 30 mLs by mouth daily.    [provider]  levETIRAcetam (KEPPRA) 1000 MG tablet Take 1,000 mg by mouth at bedtime.    [provider]  lisinopril (PRINIVIL,ZESTRIL) 20 MG tablet Take 20 mg by mouth daily.    [provider]  omeprazole (PRILOSEC) 20 MG capsule Take 20 mg by mouth daily.    [provider]  perphenazine (TRILAFON) 8 MG tablet Take 4-8 mg by mouth 3 (three) times daily. 4mg  twice daily and 8mg  tab at bedtime    [provider]  propranolol (INDERAL) 10 MG tablet  Take 10 mg by mouth 2 (two) times daily.    [provider]  sodium chloride 1 g tablet Take 1 g by mouth daily.    [provider]  traZODone (DESYREL) 150 MG tablet Take 300 mg by mouth at bedtime.     [provider]    Family History No family history on file.  Social History Social History   Tobacco Use  . Smoking status: Never Smoker  . Smokeless tobacco: Never Used  Substance Use Topics  . Alcohol use: No  . Drug use: Yes    Types: Marijuana    Comment: quit 5 years ago     Allergies   Haldol [haloperidol lactate]; Inderal [propranolol]; Januvia [sitagliptin]; and Lithium   Review of Systems Review of Systems  All other systems reviewed and are  negative.    Physical Exam Updated Vital Signs BP (!) 144/87 (BP Location: Right Arm)   Pulse 81   Temp 98.4 F (36.9 C) (Oral)   Resp 16   Wt 113.4 kg (249 lb 14.4 oz)   SpO2 100%   BMI 29.63 kg/m   Physical Exam  Constitutional: He is oriented to person, place, and time. He appears well-developed and well-nourished.  HENT:  Head: Normocephalic and atraumatic.  Eyes: Conjunctivae and EOM are normal.  Neck: Normal range of motion.  Cardiovascular: Normal rate.  Pulmonary/Chest: Effort normal. No respiratory distress.  Abdominal: Soft. He exhibits no distension.  Musculoskeletal: Normal range of motion.  Neurological: He is alert and oriented to person, place, and time. No cranial nerve deficit. Coordination normal.  Skin: Skin is warm and dry.  Nursing note and vitals reviewed.    ED Treatments / Results  Labs (all labs ordered are listed, but only abnormal results are displayed) Labs Reviewed  URINALYSIS, ROUTINE W REFLEX MICROSCOPIC    EKG None  Radiology No results found.  Procedures Procedures (including critical care time)  Medications Ordered in ED Medications - No data to display   Initial Impression / Assessment and Plan / ED Course  I have reviewed the triage vital signs and the nursing notes.  Pertinent labs & imaging results that were available during my care of the patient were reviewed by me and considered in my medical decision making (see chart for details).     Patient is not a reliable historian so not sure if this is exactly what happened however if he really is having decreased urine output he could be related to his psychiatric medications.  Multiple occasions on his list could be related.  Will need to follow-up with PCP/psychiatrist for further management.  Not retaining at this time stable for discharge. Attempted to call the number in the computer and not able to reach his next of kin or guarding.  Relayed to nursing who will  continue to try.  Final Clinical Impressions(s) / ED Diagnoses   Final diagnoses:  Decreased urine output    ED Discharge Orders    None       Marisabel Macpherson, Barbara CowerJason, MD 05/02/18 2349

## 2018-05-02 NOTE — ED Notes (Signed)
Post void bladder scan showed 56ml urine in pt bladder

## 2018-05-02 NOTE — ED Notes (Signed)
Bladder scan results that there is urine in pt bladder.  Will give him a urinal and see if he can void.

## 2018-05-02 NOTE — ED Notes (Signed)
Pt ambulating back and forth to BR several time

## 2018-05-02 NOTE — Discharge Instructions (Signed)
He is urinating freely now. No urinary tract infection. May be related to his multiple psych medications. Will suggest follow up with PCP for same.

## 2018-05-02 NOTE — ED Notes (Signed)
Pt states that his symptoms have been relieved and that he has been able to void 5 times while waiting and no longer feels that he has urinary retention any more.

## 2018-05-02 NOTE — ED Notes (Signed)
Pt was able to void in urinal after bladder scan. Pt states that he feels fine.

## 2018-05-02 NOTE — ED Notes (Signed)
Pt ambulated to the bathroom to void again

## 2018-05-13 ENCOUNTER — Other Ambulatory Visit: Payer: Self-pay

## 2018-05-13 ENCOUNTER — Emergency Department
Admission: EM | Admit: 2018-05-13 | Discharge: 2018-05-14 | Disposition: A | Discharge status: Other Acute Inpatient Hospital | Payer: Medicare Other | Attending: Emergency Medicine | Admitting: Emergency Medicine

## 2018-05-13 DIAGNOSIS — E119 Type 2 diabetes mellitus without complications: Secondary | ICD-10-CM

## 2018-05-13 DIAGNOSIS — Z79899 Other long term (current) drug therapy: Secondary | ICD-10-CM | POA: Insufficient documentation

## 2018-05-13 DIAGNOSIS — Z794 Long term (current) use of insulin: Secondary | ICD-10-CM | POA: Insufficient documentation

## 2018-05-13 DIAGNOSIS — F250 Schizoaffective disorder, bipolar type: Secondary | ICD-10-CM | POA: Diagnosis not present

## 2018-05-13 DIAGNOSIS — R451 Restlessness and agitation: Secondary | ICD-10-CM | POA: Diagnosis present

## 2018-05-13 DIAGNOSIS — F309 Manic episode, unspecified: Secondary | ICD-10-CM

## 2018-05-13 DIAGNOSIS — I10 Essential (primary) hypertension: Secondary | ICD-10-CM | POA: Diagnosis not present

## 2018-05-13 DIAGNOSIS — F319 Bipolar disorder, unspecified: Secondary | ICD-10-CM | POA: Insufficient documentation

## 2018-05-13 LAB — COMPREHENSIVE METABOLIC PANEL
ALBUMIN: 4.3 g/dL (ref 3.5–5.0)
ALT: 24 U/L (ref 0–44)
ANION GAP: 12 (ref 5–15)
AST: 53 U/L — ABNORMAL HIGH (ref 15–41)
Alkaline Phosphatase: 52 U/L (ref 38–126)
BUN: 9 mg/dL (ref 6–20)
CALCIUM: 8.9 mg/dL (ref 8.9–10.3)
CO2: 22 mmol/L (ref 22–32)
Chloride: 95 mmol/L — ABNORMAL LOW (ref 98–111)
Creatinine, Ser: 0.86 mg/dL (ref 0.61–1.24)
GFR calc Af Amer: >60 mL/min (ref >60)
GFR calc non Af Amer: >60 mL/min (ref >60)
GLUCOSE: 107 mg/dL — AB (ref 70–99)
Potassium: 3.4 mmol/L — ABNORMAL LOW (ref 3.5–5.1)
SODIUM: 129 mmol/L — AB (ref 135–145)
TOTAL PROTEIN: 7.1 g/dL (ref 6.5–8.1)
Total Bilirubin: 1.1 mg/dL (ref 0.3–1.2)

## 2018-05-13 LAB — URINE DRUG SCREEN, QUALITATIVE (ARMC ONLY)
Amphetamines, Ur Screen: NOT DETECTED
BENZODIAZEPINE, UR SCRN: POSITIVE — AB
CANNABINOID 50 NG, UR ~~LOC~~: NOT DETECTED
Cocaine Metabolite,Ur ~~LOC~~: NOT DETECTED
MDMA (Ecstasy)Ur Screen: NOT DETECTED
Methadone Scn, Ur: NOT DETECTED
Opiate, Ur Screen: NOT DETECTED
Phencyclidine (PCP) Ur S: NOT DETECTED
TRICYCLIC, UR SCREEN: NOT DETECTED

## 2018-05-13 LAB — CBC
HCT: 35.7 % — ABNORMAL LOW (ref 40.0–52.0)
Hemoglobin: 12.8 g/dL — ABNORMAL LOW (ref 13.0–18.0)
MCH: 32.1 pg (ref 26.0–34.0)
MCHC: 35.8 g/dL (ref 32.0–36.0)
MCV: 89.8 fL (ref 80.0–100.0)
PLATELETS: 161 10*3/uL (ref 150–440)
RBC: 3.98 MIL/uL — ABNORMAL LOW (ref 4.40–5.90)
RDW: 14.1 % (ref 11.5–14.5)
WBC: 7 10*3/uL (ref 3.8–10.6)

## 2018-05-13 LAB — ACETAMINOPHEN LEVEL

## 2018-05-13 LAB — ETHANOL: Alcohol, Ethyl (B): <10 mg/dL (ref <10)

## 2018-05-13 MED ORDER — DIPHENHYDRAMINE HCL 50 MG/ML IJ SOLN
INTRAMUSCULAR | Status: AC
  Administered 2018-05-13: 50 mg via INTRAMUSCULAR
  Filled 2018-05-13: qty 1

## 2018-05-13 MED ORDER — CLONAZEPAM 0.5 MG PO TABS
1.0000 mg | ORAL_TABLET | Freq: Three times a day (TID) | ORAL | Status: DC
  Administered 2018-05-13 – 2018-05-14 (×3): 1 mg via ORAL
  Filled 2018-05-13 (×3): qty 2

## 2018-05-13 MED ORDER — LORAZEPAM 2 MG/ML IJ SOLN
2.0000 mg | Freq: Once | INTRAMUSCULAR | Status: AC
  Administered 2018-05-13: 2 mg via INTRAMUSCULAR

## 2018-05-13 MED ORDER — LISINOPRIL 10 MG PO TABS
20.0000 mg | ORAL_TABLET | Freq: Once | ORAL | Status: AC
  Administered 2018-05-14: 20 mg via ORAL
  Filled 2018-05-13: qty 2

## 2018-05-13 MED ORDER — DIVALPROEX SODIUM 250 MG PO DR TAB
250.0000 mg | DELAYED_RELEASE_TABLET | Freq: Every morning | ORAL | Status: DC
  Administered 2018-05-14: 250 mg via ORAL
  Filled 2018-05-13: qty 1

## 2018-05-13 MED ORDER — ZIPRASIDONE MESYLATE 20 MG IM SOLR
INTRAMUSCULAR | Status: AC
  Administered 2018-05-13: 20 mg via INTRAMUSCULAR
  Filled 2018-05-13: qty 20

## 2018-05-13 MED ORDER — BENZTROPINE MESYLATE 1 MG PO TABS
1.0000 mg | ORAL_TABLET | Freq: Two times a day (BID) | ORAL | Status: DC
  Administered 2018-05-13 – 2018-05-14 (×2): 1 mg via ORAL
  Filled 2018-05-13 (×3): qty 1

## 2018-05-13 MED ORDER — LEVETIRACETAM 500 MG PO TABS
1000.0000 mg | ORAL_TABLET | Freq: Every day | ORAL | Status: DC
  Administered 2018-05-13: 1000 mg via ORAL
  Filled 2018-05-13: qty 2

## 2018-05-13 MED ORDER — LACTULOSE 10 GM/15ML PO SOLN
10.0000 g | Freq: Two times a day (BID) | ORAL | Status: DC
  Administered 2018-05-13 – 2018-05-14 (×2): 10 g via ORAL
  Filled 2018-05-13 (×2): qty 30

## 2018-05-13 MED ORDER — INSULIN DETEMIR 100 UNIT/ML ~~LOC~~ SOLN
28.0000 [IU] | Freq: Every day | SUBCUTANEOUS | Status: DC
  Administered 2018-05-14: 28 [IU] via SUBCUTANEOUS
  Filled 2018-05-13 (×2): qty 0.28

## 2018-05-13 MED ORDER — DIPHENHYDRAMINE HCL 50 MG/ML IJ SOLN
50.0000 mg | Freq: Once | INTRAMUSCULAR | Status: AC
  Administered 2018-05-13: 50 mg via INTRAMUSCULAR

## 2018-05-13 MED ORDER — LORAZEPAM 2 MG/ML IJ SOLN
INTRAMUSCULAR | Status: AC
  Administered 2018-05-13: 2 mg via INTRAMUSCULAR
  Filled 2018-05-13: qty 1

## 2018-05-13 MED ORDER — DIVALPROEX SODIUM 500 MG PO DR TAB
500.0000 mg | DELAYED_RELEASE_TABLET | Freq: Every day | ORAL | Status: DC
  Administered 2018-05-13: 500 mg via ORAL
  Filled 2018-05-13: qty 1

## 2018-05-13 MED ORDER — ZIPRASIDONE MESYLATE 20 MG IM SOLR
20.0000 mg | Freq: Once | INTRAMUSCULAR | Status: AC
  Administered 2018-05-13: 20 mg via INTRAMUSCULAR

## 2018-05-13 MED ORDER — PROPRANOLOL HCL 20 MG PO TABS
10.0000 mg | ORAL_TABLET | Freq: Two times a day (BID) | ORAL | Status: DC
  Administered 2018-05-13: 10 mg via ORAL
  Filled 2018-05-13 (×2): qty 1

## 2018-05-13 MED ORDER — TRAZODONE HCL 100 MG PO TABS
300.0000 mg | ORAL_TABLET | Freq: Every day | ORAL | Status: DC
  Administered 2018-05-13: 300 mg via ORAL
  Filled 2018-05-13: qty 3

## 2018-05-13 MED ORDER — AMLODIPINE BESYLATE 5 MG PO TABS
10.0000 mg | ORAL_TABLET | Freq: Once | ORAL | Status: AC
  Administered 2018-05-14: 10 mg via ORAL
  Filled 2018-05-13: qty 2

## 2018-05-13 NOTE — ED Provider Notes (Signed)
[facility] Emergency Department Provider Note   ____________________________________________   First MD Initiated Contact with Patient 05/13/18 1944     (approximate)  I have reviewed the triage vital signs and the nursing notes.   HISTORY  Chief Complaint Mental Health Problem    HPIMark Loadholt is a 47 y.o. male who has a history of mania.  He comes in with flight of ideas talking about getting this girl pregnant and that girl pregnant that is plans for the future.  He says he likes to fight and had to 5 pairs of breast knuckles.  He brags about punching someone in the face and making him cry and says he likes to fight and he is just as noted in the nurse's notes "sucking violent" says he knows karate and Rickey Barbara and several other martial arts and does also tell me that he destroys property when he gets angry.   Past Medical History:  Diagnosis Date  . Bipolar 1 disorder (HCC)   . Diabetes mellitus without complication (HCC)   . Hyperlipidemia   . Hypertension   . 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 Schizophrenia, acute (HCC)   . Seizures [facility])     Patient Active Problem List   Diagnosis Date Noted  . Somnolence 10/05/2017  . Adverse drug interaction with prescription medication 10/05/2017  . Hypertension   . Diabetes mellitus without complication (HCC)   . Bipolar 1 disorder (HCC)   . Seizures (HCC)   . Schizophrenia, acute (HCC) 05/14/2017    Past Surgical History:  Procedure Laterality Date  . JOINT REPLACEMENT      Prior to Admission medications   Medication Sig Start Date End Date Taking? Authorizing Provider  amLODipine (NORVASC) 10 MG tablet Take 10 mg by mouth daily.    [provider]  atorvastatin (LIPITOR) 40 MG tablet Take 40 mg by mouth every evening.    [provider]  benztropine (COGENTIN) 1 MG tablet Take 1 mg by mouth 2 (two) times daily.     [provider]    Cholecalciferol (VITAMIN D3) 2000 units TABS Take 2,000 Units by mouth daily.     [provider]  clonazePAM (KLONOPIN) 1 MG tablet Take 1 tablet (1 mg total) by mouth 3 (three) times daily as needed for anxiety. Patient taking differently: Take 1 mg by mouth 3 (three) times daily.  10/13/17   Cathren Laine, MD  cyclobenzaprine (FLEXERIL) 10 MG tablet Take 1 tablet (10 mg total) by mouth 3 (three) times daily as needed. 03/12/18   Triplett, Tammy, PA-C  divalproex (DEPAKOTE) 500 MG DR tablet Take 2,000 mg by mouth at bedtime.    [provider]  hydrOXYzine (VISTARIL) 50 MG capsule Take 50 mg by mouth every 4 (four) hours as needed.    [provider]  ibuprofen (ADVIL,MOTRIN) 800 MG tablet Take 1 tablet (800 mg total) by mouth every 6 (six) hours as needed. Take with food 03/12/18   Triplett, Tammy, PA-C  insulin detemir (LEVEMIR) 100 UNIT/ML injection Inject 28 Units into the skin daily.    [provider]  Lactulose 20 GM/30ML SOLN Take 30 mLs by mouth daily.    [provider]  levETIRAcetam (KEPPRA) 1000 MG tablet Take 1,000 mg by mouth at bedtime.    [provider]  lisinopril (PRINIVIL,ZESTRIL) 20 MG tablet Take 20 mg by mouth daily.    [provider]  omeprazole (PRILOSEC) 20 MG capsule  Take 20 mg by mouth daily.    [provider]  perphenazine (TRILAFON) 8 MG tablet Take 4-8 mg by mouth 3 (three) times daily. 4mg  twice daily and 8mg  tab at bedtime    [provider]  propranolol (INDERAL) 10 MG tablet Take 10 mg by mouth 2 (two) times daily.    [provider]  sodium chloride 1 g tablet Take 1 g by mouth daily.    [provider]  traZODone (DESYREL) 150 MG tablet Take 300 mg by mouth at bedtime.     [provider]    Allergies Haldol [haloperidol lactate]; Inderal [propranolol]; Januvia [sitagliptin]; and Lithium  No family history on file.  Social History Social History    Tobacco Use  . Smoking status: Never Smoker  . Smokeless tobacco: Never Used  Substance Use Topics  . Alcohol use: No  . Drug use: Yes    Types: Marijuana    Comment: quit 5 years ago    Review of Systems  Constitutional: No fever/chills Eyes: No visual changes. ENT: No sore throat. Cardiovascular: Denies chest pain. Respiratory: Denies shortness of breath. Gastrointestinal: No abdominal pain.  No nausea, no vomiting.  No diarrhea.  No constipation. Genitourinary: Negative for dysuria. Musculoskeletal: Negative for back pain. Skin: Negative for rash. Neurological: Negative for headaches, focal weakness   ____________________________________________   PHYSICAL EXAM:  VITAL SIGNS: ED Triage Vitals [05/13/18 1947]  Enc Vitals Group     BP      Pulse      Resp      Temp      Temp src      SpO2      Weight 244 lb (110.7 kg)     Height 6\' 4"  (1.93 m)     Head Circumference      Peak Flow      Pain Score 0     Pain Loc      Pain Edu?      Excl. in GC?    Constitutional: Alert and oriented. Well appearing and in no acute distress. Eyes: Conjunctivae are normal.  Head: Atraumatic. Nose: No congestion/rhinnorhea. Mouth/Throat: Mucous membranes are moist.  Oropharynx non-erythematous. Neck: No stridor. Cardiovascular: Normal rate, regular rhythm. Grossly normal heart sounds.  Good peripheral circulation. Respiratory: Normal respiratory effort.  No retractions. Lungs CTAB. Gastrointestinal: Soft and nontender. No distention. No abdominal bruits. No CVA tenderness. Musculoskeletal: No lower extremity tenderness nor edema.  No joint effusions. Neurologic: Patient with flight of ideas and grandiose ideas no gross focal neurologic deficits are appreciated. No gait instability. Skin:  Skin is warm, dry and intact. No rash noted.   ____________________________________________   LABS (all labs ordered are listed, but only abnormal results are displayed)  Labs  Reviewed  CBC - Abnormal; Notable for the following components:      Result Value   RBC 3.98 (*)    Hemoglobin 12.8 (*)    HCT 35.7 (*)    All other components within normal limits  COMPREHENSIVE METABOLIC PANEL  ETHANOL  URINE DRUG SCREEN, QUALITATIVE ([facility] ONLY)  ACETAMINOPHEN LEVEL   ____________________________________________  EKG   ____________________________________________  RADIOLOGY  ED MD interpretation:    Official radiology report(s): No results found.  ____________________________________________   PROCEDURES  Procedure(s) performed:   Procedures  Critical Care performed:   ____________________________________________   INITIAL IMPRESSION / ASSESSMENT AND PLAN / ED COURSE           ____________________________________________   FINAL  CLINICAL IMPRESSION(S) / ED DIAGNOSES  Final diagnoses:  [facility])     ED Discharge Orders    None       Note:  This document was prepared using Dragon voice recognition software and may include unintentional dictation errors.    Arnaldo Natal, MD 05/13/18 2030

## 2018-05-13 NOTE — ED Notes (Signed)
Patient requesting food, and the phone.  Patient was advised phone times are over and patient was given another diet sandwich tray. Patient did not like was this Clinical research associatewriter stated. Patient screaming and cursing at staff. Patient calling this Clinical research associatewriter a "fucking bitch" and complaining about another patient across hall and calling that patient a "bitch."

## 2018-05-13 NOTE — BH Assessment (Signed)
TTS unable to assess at this time. Pt very hypersexual. Pt saw TTS and immediately began walking in her direction stating "I need to talk to you!"

## 2018-05-13 NOTE — ED Notes (Signed)
Patient came in with [facility] from [facility]. Patient is voluntary here at this time. Patient is hypersexual talking about having sex with 3 african american women and a white girl with red hair. Patient states he is this way because his dad about killed him when he was [age]. Patient states knowing karate and will us his moves. Patient was at first refusing to dress out in behavioral scrubs but then agreed to change out. Patient did refuse to change underwear into the net underwear.

## 2018-05-13 NOTE — ED Notes (Signed)
EDP made aware of patient's behaviors, EDP placed orders and gave IM medications.

## 2018-05-13 NOTE — ED Triage Notes (Signed)
Pt arrives to ED via [facility] with c/o, according to the pt, "I'm just fucking violent". Pt keeps making comments about "knowing Karate" and destroying property with he gets angry. Pt is irritated, but cooperative at this time.

## 2018-05-13 NOTE — ED Notes (Signed)
Patient up to the restroom at this time.

## 2018-05-14 ENCOUNTER — Inpatient Hospital Stay
Admission: AD | Admit: 2018-05-14 | Discharge: 2018-05-28 | DRG: 885 | Disposition: A | Discharge status: Home / Self Care | Payer: Medicare Other | Attending: Psychiatry | Admitting: Psychiatry

## 2018-05-14 ENCOUNTER — Other Ambulatory Visit: Payer: Self-pay

## 2018-05-14 DIAGNOSIS — G40909 Epilepsy, unspecified, not intractable, without status epilepticus: Secondary | ICD-10-CM | POA: Diagnosis present

## 2018-05-14 DIAGNOSIS — F250 Schizoaffective disorder, bipolar type: Secondary | ICD-10-CM | POA: Diagnosis present

## 2018-05-14 DIAGNOSIS — K219 Gastro-esophageal reflux disease without esophagitis: Secondary | ICD-10-CM | POA: Diagnosis not present

## 2018-05-14 DIAGNOSIS — Z79891 Long term (current) use of opiate analgesic: Secondary | ICD-10-CM

## 2018-05-14 DIAGNOSIS — E871 Hypo-osmolality and hyponatremia: Secondary | ICD-10-CM | POA: Diagnosis present

## 2018-05-14 DIAGNOSIS — K5900 Constipation, unspecified: Secondary | ICD-10-CM | POA: Diagnosis not present

## 2018-05-14 DIAGNOSIS — I10 Essential (primary) hypertension: Secondary | ICD-10-CM | POA: Diagnosis present

## 2018-05-14 DIAGNOSIS — Z62810 Personal history of physical and sexual abuse in childhood: Secondary | ICD-10-CM | POA: Diagnosis present

## 2018-05-14 DIAGNOSIS — R05 Cough: Secondary | ICD-10-CM | POA: Diagnosis present

## 2018-05-14 DIAGNOSIS — Z888 Allergy status to other drugs, medicaments and biological substances status: Secondary | ICD-10-CM | POA: Diagnosis not present

## 2018-05-14 DIAGNOSIS — R569 Unspecified convulsions: Secondary | ICD-10-CM | POA: Diagnosis not present

## 2018-05-14 DIAGNOSIS — Z79899 Other long term (current) drug therapy: Secondary | ICD-10-CM

## 2018-05-14 DIAGNOSIS — E785 Hyperlipidemia, unspecified: Secondary | ICD-10-CM | POA: Diagnosis present

## 2018-05-14 DIAGNOSIS — F319 Bipolar disorder, unspecified: Secondary | ICD-10-CM | POA: Diagnosis not present

## 2018-05-14 DIAGNOSIS — W19XXXA Unspecified fall, initial encounter: Secondary | ICD-10-CM | POA: Diagnosis present

## 2018-05-14 DIAGNOSIS — Z5181 Encounter for therapeutic drug level monitoring: Secondary | ICD-10-CM | POA: Diagnosis not present

## 2018-05-14 DIAGNOSIS — E119 Type 2 diabetes mellitus without complications: Secondary | ICD-10-CM

## 2018-05-14 DIAGNOSIS — Z794 Long term (current) use of insulin: Secondary | ICD-10-CM

## 2018-05-14 LAB — BASIC METABOLIC PANEL
Anion gap: 7 (ref 5–15)
BUN: 8 mg/dL (ref 6–20)
CALCIUM: 9.3 mg/dL (ref 8.9–10.3)
CO2: 29 mmol/L (ref 22–32)
CREATININE: 0.79 mg/dL (ref 0.61–1.24)
Chloride: 99 mmol/L (ref 98–111)
GFR calc non Af Amer: >60 mL/min (ref >60)
GLUCOSE: 143 mg/dL — AB (ref 70–99)
Potassium: 3.6 mmol/L (ref 3.5–5.1)
Sodium: 135 mmol/L (ref 135–145)

## 2018-05-14 LAB — GLUCOSE, CAPILLARY
GLUCOSE-CAPILLARY: 128 mg/dL — AB (ref 70–99)
Glucose-Capillary: 205 mg/dL — ABNORMAL HIGH (ref 70–99)

## 2018-05-14 LAB — VALPROIC ACID LEVEL: Valproic Acid Lvl: 60 ug/mL (ref 50.0–100.0)

## 2018-05-14 MED ORDER — ZIPRASIDONE MESYLATE 20 MG IM SOLR
40.0000 mg | Freq: Once | INTRAMUSCULAR | Status: AC
  Administered 2018-05-14: 40 mg via INTRAMUSCULAR

## 2018-05-14 MED ORDER — ZIPRASIDONE MESYLATE 20 MG IM SOLR
20.0000 mg | Freq: Once | INTRAMUSCULAR | Status: AC
  Administered 2018-05-14: 20 mg via INTRAMUSCULAR

## 2018-05-14 MED ORDER — DIVALPROEX SODIUM 500 MG PO DR TAB
2500.0000 mg | DELAYED_RELEASE_TABLET | Freq: Every day | ORAL | Status: AC
  Administered 2018-05-14: 2500 mg via ORAL

## 2018-05-14 MED ORDER — LORAZEPAM 2 MG PO TABS
ORAL_TABLET | ORAL | Status: AC
  Administered 2018-05-14: 2 mg
  Filled 2018-05-14: qty 1

## 2018-05-14 MED ORDER — ACETAMINOPHEN 325 MG PO TABS
650.0000 mg | ORAL_TABLET | Freq: Four times a day (QID) | ORAL | Status: DC | PRN
  Administered 2018-05-16 – 2018-05-27 (×5): 650 mg via ORAL
  Filled 2018-05-14 (×6): qty 2

## 2018-05-14 MED ORDER — PERPHENAZINE 4 MG PO TABS
8.0000 mg | ORAL_TABLET | Freq: Three times a day (TID) | ORAL | Status: DC
  Administered 2018-05-15 – 2018-05-28 (×39): 8 mg via ORAL
  Filled 2018-05-14 (×42): qty 2

## 2018-05-14 MED ORDER — DIVALPROEX SODIUM 500 MG PO DR TAB
2500.0000 mg | DELAYED_RELEASE_TABLET | Freq: Every day | ORAL | Status: DC
  Filled 2018-05-14: qty 5

## 2018-05-14 MED ORDER — LEVETIRACETAM 500 MG PO TABS
1000.0000 mg | ORAL_TABLET | Freq: Every day | ORAL | Status: DC
  Administered 2018-05-14 – 2018-05-27 (×14): 1000 mg via ORAL
  Filled 2018-05-14 (×14): qty 2

## 2018-05-14 MED ORDER — INSULIN ASPART 100 UNIT/ML ~~LOC~~ SOLN
0.0000 [IU] | Freq: Three times a day (TID) | SUBCUTANEOUS | Status: DC
  Administered 2018-05-15 – 2018-05-17 (×3): 3 [IU] via SUBCUTANEOUS
  Administered 2018-05-17: 8 [IU] via SUBCUTANEOUS
  Administered 2018-05-17 – 2018-05-18 (×2): 2 [IU] via SUBCUTANEOUS
  Administered 2018-05-18: 5 [IU] via SUBCUTANEOUS
  Administered 2018-05-19 (×2): 2 [IU] via SUBCUTANEOUS
  Administered 2018-05-19: 5 [IU] via SUBCUTANEOUS
  Administered 2018-05-20: 2 [IU] via SUBCUTANEOUS
  Administered 2018-05-20: 3 [IU] via SUBCUTANEOUS
  Administered 2018-05-20 – 2018-05-21 (×2): 2 [IU] via SUBCUTANEOUS
  Administered 2018-05-21: 3 [IU] via SUBCUTANEOUS
  Administered 2018-05-21 – 2018-05-22 (×3): 2 [IU] via SUBCUTANEOUS
  Administered 2018-05-22 – 2018-05-23 (×2): 3 [IU] via SUBCUTANEOUS
  Administered 2018-05-23: 2 [IU] via SUBCUTANEOUS
  Administered 2018-05-23 – 2018-05-24 (×2): 3 [IU] via SUBCUTANEOUS
  Administered 2018-05-24: 2 [IU] via SUBCUTANEOUS
  Administered 2018-05-24 – 2018-05-25 (×3): 3 [IU] via SUBCUTANEOUS
  Administered 2018-05-26: 2 [IU] via SUBCUTANEOUS
  Administered 2018-05-26: 3 [IU] via SUBCUTANEOUS
  Administered 2018-05-26: 2 [IU] via SUBCUTANEOUS
  Administered 2018-05-27 (×2): 3 [IU] via SUBCUTANEOUS
  Administered 2018-05-27: 5 [IU] via SUBCUTANEOUS
  Administered 2018-05-28: 2 [IU] via SUBCUTANEOUS
  Administered 2018-05-28: 3 [IU] via SUBCUTANEOUS
  Filled 2018-05-14 (×33): qty 1

## 2018-05-14 MED ORDER — PERPHENAZINE 4 MG PO TABS
8.0000 mg | ORAL_TABLET | Freq: Three times a day (TID) | ORAL | Status: DC
  Filled 2018-05-14 (×2): qty 2

## 2018-05-14 MED ORDER — DIPHENHYDRAMINE HCL 50 MG/ML IJ SOLN
INTRAMUSCULAR | Status: AC
  Filled 2018-05-14: qty 1

## 2018-05-14 MED ORDER — CLONAZEPAM 1 MG PO TABS
1.0000 mg | ORAL_TABLET | Freq: Three times a day (TID) | ORAL | Status: DC
  Administered 2018-05-15 – 2018-05-19 (×11): 1 mg via ORAL
  Filled 2018-05-14 (×11): qty 1

## 2018-05-14 MED ORDER — HYDROXYZINE HCL 50 MG PO TABS
50.0000 mg | ORAL_TABLET | ORAL | Status: DC | PRN
  Administered 2018-05-14 – 2018-05-27 (×10): 50 mg via ORAL
  Filled 2018-05-14 (×10): qty 1

## 2018-05-14 MED ORDER — TRAZODONE HCL 50 MG PO TABS: 150.0000 mg | ORAL_TABLET | Freq: Every day | ORAL | Status: DC

## 2018-05-14 MED ORDER — AMLODIPINE BESYLATE 5 MG PO TABS
5.0000 mg | ORAL_TABLET | Freq: Every day | ORAL | Status: DC
  Administered 2018-05-15 – 2018-05-28 (×13): 5 mg via ORAL
  Filled 2018-05-14 (×14): qty 1

## 2018-05-14 MED ORDER — ZIPRASIDONE MESYLATE 20 MG IM SOLR
INTRAMUSCULAR | Status: AC
  Filled 2018-05-14: qty 40

## 2018-05-14 MED ORDER — ALUM & MAG HYDROXIDE-SIMETH 200-200-20 MG/5ML PO SUSP: 30.0000 mL | ORAL | Status: DC | PRN

## 2018-05-14 MED ORDER — LACTULOSE 10 GM/15ML PO SOLN
10.0000 g | Freq: Two times a day (BID) | ORAL | Status: DC
  Administered 2018-05-15 – 2018-05-28 (×25): 10 g via ORAL
  Filled 2018-05-14 (×25): qty 30

## 2018-05-14 MED ORDER — MAGNESIUM HYDROXIDE 400 MG/5ML PO SUSP: 30.0000 mL | Freq: Every day | ORAL | Status: DC | PRN

## 2018-05-14 MED ORDER — OLANZAPINE 10 MG PO TABS
15.0000 mg | ORAL_TABLET | Freq: Three times a day (TID) | ORAL | Status: DC | PRN
  Administered 2018-05-14 – 2018-05-16 (×5): 15 mg via ORAL
  Filled 2018-05-14 (×5): qty 1

## 2018-05-14 MED ORDER — OLANZAPINE 10 MG IM SOLR
10.0000 mg | Freq: Three times a day (TID) | INTRAMUSCULAR | Status: DC | PRN
  Filled 2018-05-14: qty 10

## 2018-05-14 MED ORDER — ZIPRASIDONE MESYLATE 20 MG IM SOLR
INTRAMUSCULAR | Status: AC
  Filled 2018-05-14: qty 20

## 2018-05-14 MED ORDER — PANTOPRAZOLE SODIUM 40 MG PO TBEC: 40.0000 mg | DELAYED_RELEASE_TABLET | Freq: Every day | ORAL | Status: DC

## 2018-05-14 MED ORDER — DIVALPROEX SODIUM 500 MG PO DR TAB: 2500.0000 mg | DELAYED_RELEASE_TABLET | Freq: Every day | ORAL | Status: DC

## 2018-05-14 MED ORDER — PANTOPRAZOLE SODIUM 40 MG PO TBEC
40.0000 mg | DELAYED_RELEASE_TABLET | Freq: Every day | ORAL | Status: DC
  Administered 2018-05-15 – 2018-05-28 (×14): 40 mg via ORAL
  Filled 2018-05-14 (×14): qty 1

## 2018-05-14 MED ORDER — DIVALPROEX SODIUM 500 MG PO DR TAB
750.0000 mg | DELAYED_RELEASE_TABLET | Freq: Three times a day (TID) | ORAL | Status: DC
  Administered 2018-05-15 – 2018-05-21 (×19): 750 mg via ORAL
  Filled 2018-05-14 (×20): qty 1

## 2018-05-14 MED ORDER — LORAZEPAM 2 MG/ML IJ SOLN
4.0000 mg | Freq: Once | INTRAMUSCULAR | Status: AC
  Administered 2018-05-14: 4 mg via INTRAMUSCULAR

## 2018-05-14 MED ORDER — INSULIN DETEMIR 100 UNIT/ML ~~LOC~~ SOLN
28.0000 [IU] | Freq: Every day | SUBCUTANEOUS | Status: DC
  Administered 2018-05-15 – 2018-05-28 (×14): 28 [IU] via SUBCUTANEOUS
  Filled 2018-05-14 (×15): qty 0.28

## 2018-05-14 MED ORDER — LISINOPRIL 10 MG PO TABS: 20.0000 mg | ORAL_TABLET | Freq: Every day | ORAL | Status: DC

## 2018-05-14 MED ORDER — AMLODIPINE BESYLATE 5 MG PO TABS: 5.0000 mg | ORAL_TABLET | Freq: Every day | ORAL | Status: DC

## 2018-05-14 MED ORDER — LORAZEPAM 2 MG/ML IJ SOLN
INTRAMUSCULAR | Status: AC
  Filled 2018-05-14: qty 2

## 2018-05-14 MED ORDER — LISINOPRIL 20 MG PO TABS
20.0000 mg | ORAL_TABLET | Freq: Every day | ORAL | Status: DC
  Administered 2018-05-15 – 2018-05-28 (×14): 20 mg via ORAL
  Filled 2018-05-14 (×14): qty 1

## 2018-05-14 MED ORDER — TRAZODONE HCL 50 MG PO TABS
150.0000 mg | ORAL_TABLET | Freq: Every day | ORAL | Status: DC
  Administered 2018-05-14 – 2018-05-16 (×3): 150 mg via ORAL
  Filled 2018-05-14 (×4): qty 1

## 2018-05-14 MED ORDER — INSULIN ASPART 100 UNIT/ML ~~LOC~~ SOLN: 0.0000 [IU] | Freq: Three times a day (TID) | SUBCUTANEOUS | Status: DC

## 2018-05-14 MED ORDER — BENZTROPINE MESYLATE 1 MG PO TABS
1.0000 mg | ORAL_TABLET | Freq: Two times a day (BID) | ORAL | Status: DC
  Administered 2018-05-14 – 2018-05-28 (×27): 1 mg via ORAL
  Filled 2018-05-14 (×27): qty 1

## 2018-05-14 MED ORDER — DIPHENHYDRAMINE HCL 50 MG/ML IJ SOLN
50.0000 mg | Freq: Once | INTRAMUSCULAR | Status: AC
  Administered 2018-05-14: 50 mg via INTRAVENOUS

## 2018-05-14 NOTE — ED Provider Notes (Signed)
-----------------------------------------   9:37 AM on 05/14/2018 -----------------------------------------   Blood pressure 122/90, pulse 79, temperature 98 F (36.7 C), resp. rate 19, height 6\' 4"  (1.93 m), weight 110.7 kg (244 lb), SpO2 98 %.  The patient had no acute events since last update.  Calm and cooperative at this time.  Disposition is pending Psychiatry/Behavioral Medicine team recommendations.     Sharman CheekStafford, Nadirah Socorro, MD 05/14/18 [phone]

## 2018-05-14 NOTE — BH Assessment (Signed)
Writer called and left a HIPPA Compliant message with contact information patient's POA provided Theodoro Grist(Dave [phone]), requesting a return phone call.

## 2018-05-14 NOTE — ED Notes (Signed)
Psychiatrist is currently at bedside.

## 2018-05-14 NOTE — ED Notes (Signed)
Standing in the doorway - talking loudly  "I need the phone - go get the phone"  Pt given a list of our guidelines and a crayon  - plan of care discussed  Continue to monitor

## 2018-05-14 NOTE — ED Notes (Signed)
BEHAVIORAL HEALTH ROUNDING Patient sleeping: No. Patient alert and oriented: yes Behavior appropriate: Yes.  ; If no, describe:  Nutrition and fluids offered: yes Toileting and hygiene offered: Yes  Sitter present: q15 minute observations and security monitoring Law enforcement present: Yes    

## 2018-05-14 NOTE — ED Notes (Signed)
ED  Is the patient under IVC or is there intent for IVC: Yes.   Is the patient medically cleared: Yes.   Is there vacancy in the ED BHU: Yes.   Is the population mix appropriate for patient: Yes.   Is the patient awaiting placement in inpatient or outpatient setting:  Has the patient had a psychiatric consult:  Consult pending   Survey of unit performed for contraband, proper placement and condition of furniture, tampering with fixtures in bathroom, shower, and each patient room: Yes.  ; Findings:  APPEARANCE/BEHAVIOR cooperative  he has needed to be redirected to stay in his room  NEURO ASSESSMENT Orientation: oriented x4  Denies pain Hallucinations: No.None noted (Hallucinations) Speech: Normal Gait: normal RESPIRATORY ASSESSMENT Even  Unlabored respirations  CARDIOVASCULAR ASSESSMENT Pulses equal   regular rate  Skin warm and dry   GASTROINTESTINAL ASSESSMENT no GI complaint EXTREMITIES Full ROM  PLAN OF CARE Provide calm/safe environment. Vital signs assessed twice daily. ED BHU Assessment once each 12-hour shift. Collaborate with TTS daily or as condition indicates. Assure the ED provider has rounded once each shift. Provide and encourage hygiene. Provide redirection as needed. Assess for escalating behavior; address immediately and inform ED provider.  Assess family dynamic and appropriateness for visitation as needed: Yes.  ; If necessary, describe findings:  Educate the patient/family about BHU procedures/visitation: Yes.  ; If necessary, describe findings:

## 2018-05-14 NOTE — ED Notes (Signed)
PT  IVC  SEEN  BY  DR  CLAPACS  PENDING  PLACEMENT 

## 2018-05-14 NOTE — BH Assessment (Signed)
Writer called and left a HIPPA Compliant message with Group [facility] [phone]), requesting a return phone call.

## 2018-05-14 NOTE — ED Provider Notes (Addendum)
patient becomes very agitated and threatening to kill one of the nurses. He is given Geodon IM is also threatening the officers as well as happening. Afterwards he seems to begin to calm down. Plan is to admit this gentleman down stairs.   [name], Paul F, MD 05/14/18 1844 I should add that prior to his getting Geodon an EKG was obtained which was read and interpreted by me shows normal sinus rhythm rate of 97 rightward axis no acute ST-T wave changes QTc was 447 ms   [name], Paul F, MD 05/14/18 [phone]

## 2018-05-14 NOTE — ED Notes (Signed)
Report from RN. Patient sleeping, respirations regular and unlabored. Q15 minute rounds and observation by Rover and Officer to continue. 

## 2018-05-14 NOTE — ED Notes (Signed)
BEHAVIORAL HEALTH ROUNDING Patient sleeping: No. Patient alert and oriented: yes Behavior appropriate:   ; If no, describe:  Nutrition and fluids offered: yes Toileting and hygiene offered: Yes  Sitter present: q15 minute observations and security monitoring Law enforcement present: Yes   ENVIRONMENTAL ASSESSMENT Potentially harmful objects out of patient reach: Yes.   Personal belongings secured: Yes.   Patient dressed in hospital provided attire only: Yes.   Plastic bags out of patient reach: Yes.   Patient care equipment (cords, cables, call bells, lines, and drains) shortened, removed, or accounted for: Yes.   Equipment and supplies removed from bottom of stretcher: Yes.   Potentially toxic materials out of patient reach: Yes.   Sharps container removed or out of patient reach: Yes.

## 2018-05-14 NOTE — BH Assessment (Signed)
Writer spoke with group home owner Rowan Blase(Lawanda Ray from [facility] at [phone]) in which pt resides and collected the following information:  Writer learned that pt moved to group home on Monday of this week from [facility] home. Ms. Rosalia HammersRay reports that she may have been provided false information from previous group home as they stated pt was "no trouble at all." Ms. Ray states that since pt arrived he has completely destroyed his room, threatened staff and residents, and displaying hypersexual behaviors. Ms. Rosalia HammersRay reports that she instructed staff to call the police after pt drew back his fist as if he was going to strike one of the staff. Pt observed in group home shouting "Fuck you" to other residents and walking in resident rooms despite being told not to.

## 2018-05-14 NOTE — Consult Note (Signed)
[place] Psychiat[facility] Consult   Reason for Consult: Consult for 47 year old man with a history of chronic mental health problems or comes into the hospital under IVC very agitated Referring Physician: Joni Fears Patient Identification: Oscar Duke MRN:  [id number] Principal Diagnosis: Schizoaffective disorder, bipolar type [facility]) Diagnosis:   Patient Active Problem List   Diagnosis Date Noted  . Schizoaffective disorder, bipolar type ([place]) [F25.0] 05/14/2018  . Somnolence [R40.0] 10/05/2017  . Adverse drug interaction with prescription medication [T50.905A] 10/05/2017  . Hypertension [I10]   . Diabetes mellitus without complication ([place]) [Z16.9]   . Bipolar 1 disorder ([place]) [F31.9]   . Seizures ([place]) [R56.9]   . Schizophrenia, acute ([place]) [F23] 05/14/2017    Total Time spent with patient: 1 hour  Subjective:   Oscar Duke is a 47 y.o. male patient admitted with "I have got mental problems".  HPI: Patient seen chart reviewed.  47 year old man sent from his group home with involuntary commitment papers.  Patient has been agitated and disorganized and difficult to assess throughout his hours in the emergency room.  He has been described as hypersexual hyperverbal agitated at times.  When I came to assess him he was loud and screaming disorganized in his room even before I spoke with him.  Nevertheless, when I actually went into talk with him he sat down and he was not threatening towards me.  He does display flight of ideas rapid speech disorganized thinking and says he is constantly having hallucinations.  Talks about how he feels violent and feels like hurting people at his group home but has no particular rationale for it.  It is impossible to get enough of the word and to even interrogate some of his disorganized statements.  Patient claims that he has been taking his psychiatric medicine but does not know what it is.  Judging from the notes we have it looks like he may be on an  inadequately small amount of some of his medicines recently.  Patient denies any drug or alcohol use initially but later says that he does drink alcohol.  Again it is hard to know what to make of any of his history.  Social history: Patient evidently has a legal guardian.  He is originally from [facility].  Has been living here in our area for about a year.  He says he is only been at the current group home a short amount of time.  Medical history: Has diabetes and high blood pressure.  Past history of seizure disorder which the details are a little unclear.  Substance abuse history: Past notes had not reported anything about substance abuse.  He has history that he gives to me about alcohol and drug abuse seems somewhat unreliable.  There is no alcohol in his system  Past Psychiatric History: Patient again was unreliable talking with me telling me that he has not been in a psychiatric hospital since he was 47 years old.  I saw this gentleman once before last year and at that time he was pretty clear that he had had a very large number of hospitalizations including lengthy stays at [facility].  Apparently has a history of getting agitated when psychotic.  Unclear if he is actually been violent to anyone.  Last time we saw him he was on antipsychotics and a pretty good-sized dose of Depakote.  Risk to Self: Suicidal Ideation: No Suicidal Intent: No Is patient at risk for suicide?: No Suicidal Plan?: No Access to Means: No What has  been your use of drugs/alcohol within the last 12 months?: None reported How many times?: 0 Other Self Harm Risks: Reports of none Triggers for Past Attempts: Unknown Intentional Self Injurious Behavior: None Risk to Others: Homicidal Ideation: No(Per POA) Thoughts of Harm to Others: No(Per POA) Current Homicidal Intent: No(Per POA) Current Homicidal Plan: No(Per POA) Access to Homicidal Means: No(Per POA) Identified Victim: None Reported History of  harm to others?: No(Per POA) Assessment of Violence: None Noted(Per POA) Violent Behavior Description: Verbally abusive Does patient have access to weapons?: No Criminal Charges Pending?: No Does patient have a court date: No Prior Inpatient Therapy: Prior Inpatient Therapy: Yes Prior Therapy Dates: Multiple Hospitalizations, Unable to remember Prior Therapy Facilty/Provider(s): Multiple Hospitalizations, Unable to remember  Reason for Treatment: Manic Behaviors Prior Outpatient Therapy: Prior Outpatient Therapy: Yes Prior Therapy Dates: Current Prior Therapy Facilty/Provider(s): Currently Reason for Treatment: Medication Managment Does patient have an ACCT team?: No Does patient have Intensive In-House Services?  : No Does patient have [facility] services? : No Does patient have P4CC services?: No  Past Medical History:  Past Medical History:  Diagnosis Date  . Bipolar 1 disorder ([place])   . Diabetes mellitus without complication ([place])   . Hyperlipidemia   . Hypertension   . Manic affective disorder with recurrent episode ([place])   . Schizophrenia, acute ([place])   . Seizures ([place])     Past Surgical History:  Procedure Laterality Date  . JOINT REPLACEMENT     Family History: No family history on file. Family Psychiatric  History: Patient was unable to answer this question in any meaningful way Social History:  Social History   Substance and Sexual Activity  Alcohol Use No     Social History   Substance and Sexual Activity  Drug Use Yes  . Types: Marijuana   Comment: quit 5 years ago    Social History   Socioeconomic History  . Marital status: Single    Spouse name: Not on file  . Number of children: Not on file  . Years of education: Not on file  . Highest education level: Not on file  Occupational History  . Not on file  Social Needs  . Financial resource strain: Not on file  . Food insecurity:    Worry: Not on file    Inability: Not on file  . Transportation  needs:    Medical: Not on file    Non-medical: Not on file  Tobacco Use  . Smoking status: Never Smoker  . Smokeless tobacco: Never Used  Substance and Sexual Activity  . Alcohol use: No  . Drug use: Yes    Types: Marijuana    Comment: quit 5 years ago  . Sexual activity: Not on file  Lifestyle  . Physical activity:    Days per week: Not on file    Minutes per session: Not on file  . Stress: Not on file  Relationships  . Social connections:    Talks on phone: Not on file    Gets together: Not on file    Attends religious service: Not on file    Active member of club or organization: Not on file    Attends meetings of clubs or organizations: Not on file    Relationship status: Not on file  Other Topics Concern  . Not on file  Social History Narrative  . Not on file   Additional Social History:    Allergies:   Allergies  Allergen Reactions  .  Haldol [Haloperidol Lactate]   . Inderal [Propranolol]   . Januvia [Sitagliptin] Other (See Comments)    Skin discoloration on lower legs  . Lithium Other (See Comments)    Seizures    Labs:  Results for orders placed or performed during the hospital encounter of 05/13/18 (from the past 48 hour(s))  Comprehensive metabolic panel     Status: Abnormal   Collection Time: 05/13/18  8:03 PM  Result Value Ref Range   Sodium 129 (L) 135 - 145 mmol/L   Potassium 3.4 (L) 3.5 - 5.1 mmol/L   Chloride 95 (L) 98 - 111 mmol/L    Comment: Please note change in reference range.   CO2 22 22 - 32 mmol/L   Glucose, Bld 107 (H) 70 - 99 mg/dL    Comment: Please note change in reference range.   BUN 9 6 - 20 mg/dL    Comment: Please note change in reference range.   Creatinine, Ser 0.86 0.61 - 1.24 mg/dL   Calcium 8.9 8.9 - 10.3 mg/dL   Total Protein 7.1 6.5 - 8.1 g/dL   Albumin 4.3 3.5 - 5.0 g/dL   AST 53 (H) 15 - 41 U/L   ALT 24 0 - 44 U/L    Comment: Please note change in reference range.   Alkaline Phosphatase 52 38 - 126 U/L    Total Bilirubin 1.1 0.3 - 1.2 mg/dL   GFR calc non Af Amer >60 >60 mL/min   GFR calc Af Amer >60 >60 mL/min    Comment: (NOTE) The eGFR has been calculated using the CKD EPI equation. This calculation has not been validated in all clinical situations. eGFR's persistently <60 mL/min signify possible Chronic Kidney Disease.    Anion gap 12 5 - 15    Comment: Performed at [facility], [address] [place], [place] [address]  Ethanol     Status: None   Collection Time: 05/13/18  8:03 PM  Result Value Ref Range   Alcohol, Ethyl (B) <10 <10 mg/dL    Comment: (NOTE) Lowest detectable limit for serum alcohol is 10 mg/dL. For medical purposes only. Performed at [facility], [address] [place], [place] [address]   cbc     Status: Abnormal   Collection Time: 05/13/18  8:03 PM  Result Value Ref Range   WBC 7.0 3.8 - 10.6 K/uL   RBC 3.98 (L) 4.40 - 5.90 MIL/uL   Hemoglobin 12.8 (L) 13.0 - 18.0 g/dL   HCT 35.7 (L) 40.0 - 52.0 %   MCV 89.8 80.0 - 100.0 fL   MCH 32.1 26.0 - 34.0 pg   MCHC 35.8 32.0 - 36.0 g/dL   RDW 14.1 11.5 - 14.5 %   Platelets 161 150 - 440 K/uL    Comment: Performed at [facility], [address] [place], [place] [address]  Urine Drug Screen, Qualitative     Status: Abnormal   Collection Time: 05/13/18  8:03 PM  Result Value Ref Range   Tricyclic, Ur Screen NONE DETECTED NONE DETECTED   Amphetamines, Ur Screen NONE DETECTED NONE DETECTED   MDMA (Ecstasy)Ur Screen NONE DETECTED NONE DETECTED   Cocaine Metabolite,Ur [place] NONE DETECTED NONE DETECTED   Opiate, Ur Screen NONE DETECTED NONE DETECTED   Phencyclidine (PCP) Ur S NONE DETECTED NONE DETECTED   Cannabinoid 50 Ng, Ur [place] NONE DETECTED NONE DETECTED   Barbiturates, Ur Screen (A) NONE DETECTED    Result not available. Reagent lot number recalled  by manufacturer.   Benzodiazepine, Ur Scrn POSITIVE (A) NONE DETECTED   Methadone Scn, Ur NONE DETECTED NONE DETECTED    Comment:  (NOTE) Tricyclics + metabolites, urine    Cutoff 1000 ng/mL Amphetamines + metabolites, urine  Cutoff 1000 ng/mL MDMA (Ecstasy), urine              Cutoff 500 ng/mL Cocaine Metabolite, urine          Cutoff 300 ng/mL Opiate + metabolites, urine        Cutoff 300 ng/mL Phencyclidine (PCP), urine         Cutoff 25 ng/mL Cannabinoid, urine                 Cutoff 50 ng/mL Barbiturates + metabolites, urine  Cutoff 200 ng/mL Benzodiazepine, urine              Cutoff 200 ng/mL Methadone, urine                   Cutoff 300 ng/mL The urine drug screen provides only a preliminary, unconfirmed analytical test result and should not be used for non-medical purposes. Clinical consideration and professional judgment should be applied to any positive drug screen result due to possible interfering substances. A more specific alternate chemical method must be used in order to obtain a confirmed analytical result. Gas chromatography / mass spectrometry (GC/MS) is the preferred confirmat ory method. Performed at [facility], [address] [place], [place] [address]   Acetaminophen level     Status: Abnormal   Collection Time: 05/13/18  8:03 PM  Result Value Ref Range   Acetaminophen (Tylenol), Serum <10 (L) 10 - 30 ug/mL    Comment: (NOTE) Therapeutic concentrations vary significantly. A range of 10-30 ug/mL  may be an effective concentration for many patients. However, some  are best treated at concentrations outside of this range. Acetaminophen concentrations >150 ug/mL at 4 hours after ingestion  and >50 ug/mL at 12 hours after ingestion are often associated with  toxic reactions. Performed at [facility], [address] [place], [place] [address]   Glucose, capillary     Status: Abnormal   Collection Time: 05/14/18  8:15 AM  Result Value Ref Range   Glucose-Capillary 205 (H) 70 - 99 mg/dL    Current Facility-Administered Medications  Medication Dose Route  Frequency Provider Last Rate Last Dose  . benztropine (COGENTIN) tablet 1 mg  1 mg Oral BID Nena Polio, MD   1 mg at 05/14/18 9449  . clonazePAM (KLONOPIN) tablet 1 mg  1 mg Oral TID Nena Polio, MD   1 mg at 05/14/18 1612  . divalproex (DEPAKOTE) DR tablet 250 mg  250 mg Oral q morning - 10a Nena Polio, MD   250 mg at 05/14/18 6759  . divalproex (DEPAKOTE) DR tablet 500 mg  500 mg Oral QHS Nena Polio, MD   500 mg at 05/13/18 2307  . insulin detemir (LEVEMIR) injection 28 Units  28 Units Subcutaneous Daily Nena Polio, MD   28 Units at 05/14/18 0818  . lactulose (CHRONULAC) 10 GM/15ML solution 10 g  10 g Oral BID Nena Polio, MD   10 g at 05/14/18 1638  . levETIRAcetam (KEPPRA) tablet 1,000 mg  1,000 mg Oral QHS Nena Polio, MD   1,000 mg at 05/13/18 2306  . traZODone (DESYREL) tablet 300 mg  300 mg Oral QHS Nena Polio, MD   300 mg  at 05/13/18 2306   Current Outpatient Medications  Medication Sig Dispense Refill  . amLODipine (NORVASC) 10 MG tablet Take 10 mg by mouth daily.    Marland Kitchen atorvastatin (LIPITOR) 40 MG tablet Take 40 mg by mouth every evening.    . benztropine (COGENTIN) 1 MG tablet Take 1 mg by mouth 2 (two) times daily.     . Cholecalciferol (VITAMIN D3) 2000 units TABS Take 2,000 Units by mouth daily.     . clonazePAM (KLONOPIN) 1 MG tablet Take 1 tablet (1 mg total) by mouth 3 (three) times daily as needed for anxiety. (Patient taking differently: Take 1 mg by mouth 3 (three) times daily. )    . cyclobenzaprine (FLEXERIL) 10 MG tablet Take 1 tablet (10 mg total) by mouth 3 (three) times daily as needed. 21 tablet 0  . divalproex (DEPAKOTE) 500 MG DR tablet Take 2,000 mg by mouth at bedtime.    . hydrOXYzine (VISTARIL) 50 MG capsule Take 50 mg by mouth every 4 (four) hours as needed.    Marland Kitchen ibuprofen (ADVIL,MOTRIN) 800 MG tablet Take 1 tablet (800 mg total) by mouth every 6 (six) hours as needed. Take with food 21 tablet 0  . insulin detemir (LEVEMIR)  100 UNIT/ML injection Inject 28 Units into the skin daily.    . Lactulose 20 GM/30ML SOLN Take 30 mLs by mouth daily.    Marland Kitchen levETIRAcetam (KEPPRA) 1000 MG tablet Take 1,000 mg by mouth at bedtime.    Marland Kitchen lisinopril (PRINIVIL,ZESTRIL) 20 MG tablet Take 20 mg by mouth daily.    Marland Kitchen omeprazole (PRILOSEC) 20 MG capsule Take 20 mg by mouth daily.    Marland Kitchen perphenazine (TRILAFON) 8 MG tablet Take 4-8 mg by mouth 3 (three) times daily. 55m twice daily and 830mtab at bedtime    . propranolol (INDERAL) 10 MG tablet Take 10 mg by mouth 2 (two) times daily.    . sodium chloride 1 g tablet Take 1 g by mouth daily.    . traZODone (DESYREL) 150 MG tablet Take 300 mg by mouth at bedtime.       Musculoskeletal: Strength & Muscle Tone: within normal limits Gait & Station: normal Patient leans: N/A  Psychiatric Specialty Exam: Physical Exam  Nursing note and vitals reviewed. Constitutional: He appears well-developed and well-nourished.  HENT:  Head: Normocephalic and atraumatic.  Eyes: Pupils are equal, round, and reactive to light. Conjunctivae are normal.  Neck: Normal range of motion.  Cardiovascular: Regular rhythm and normal heart sounds.  Respiratory: Effort normal. No respiratory distress.  GI: Soft.  Musculoskeletal: Normal range of motion.  Neurological: He is alert.  Skin: Skin is warm and dry.  Psychiatric: His affect is labile and inappropriate. His speech is rapid and/or pressured and tangential. He is agitated and aggressive. He is not combative. Thought content is delusional. Thought content is not paranoid. Cognition and memory are impaired. He expresses impulsivity and inappropriate judgment. He expresses no homicidal and no suicidal ideation. He exhibits abnormal recent memory and abnormal remote memory.    Review of Systems  Constitutional: Negative.   HENT: Negative.   Eyes: Negative.   Respiratory: Negative.   Cardiovascular: Negative.   Gastrointestinal: Negative.   Musculoskeletal:  Negative.   Skin: Negative.   Neurological: Negative.   Psychiatric/Behavioral: Positive for hallucinations. Negative for depression, memory loss, substance abuse and suicidal ideas. The patient is nervous/anxious and has insomnia.     Blood pressure 122/90, pulse 79, temperature 98 F (36.7 C), resp.  rate 19, height _0  (1.93 m), weight 110.7 kg (244 lb), SpO2 98 %.Body mass index is 29.7 kg/m.  General Appearance: Casual  Eye Contact:  Good  Speech:  Pressured  Volume:  Increased  Mood:  Euphoric and Irritable  Affect:  Congruent, Inappropriate and Labile  Thought Process:  Disorganized  Orientation:  Other:  He seemed to be aware of his situation but but I was unable to ask enough direct questions to know if he understands the specifics  Thought Content:  Illogical, Hallucinations: Auditory and Tangential  Suicidal Thoughts:  No  Homicidal Thoughts:  Yes.  without intent/plan  Memory:  Immediate;   Fair Recent;   Poor Remote;   Poor  Judgement:  Impaired  Insight:  Shallow  Psychomotor Activity:  Increased  Concentration:  Concentration: Poor  Recall:  Poor  Fund of Knowledge:  Fair  Language:  Fair  Akathisia:  No  Handed:  Right  AIMS (if indicated):     Assets:  Housing Resilience  ADL's:  Impaired  Cognition:  Impaired,  Mild  Sleep:        Treatment Plan Summary: Daily contact with patient to assess and evaluate symptoms and progress in treatment, Medication management and Plan 47 year old man presents with lots of symptoms typical of mania with psychotic features.  He comes across as being agitated frequently shouting and yelling and acting belligerent and at times acting in a intimidating manner especially towards women.  Nevertheless it does not seem that he is actually assaulted anyone in the emergency room and when nurses have set clear boundaries with him more insisted that he was going to get an injection and he has backed down and allowed treatment to  proceed appropriately.  Patient is quite different than he was last time I saw him.  Clearly needs hospitalization.  He is agreeable to the plan.  Orders will be placed based on his current medications at his group home but with increases in mood stabilizer and antipsychotic.  15-minute checks.  Ordered EKG labs reviewed.  Disposition: Recommend psychiatric Inpatient admission when medically cleared. Supportive therapy provided about ongoing stressors.  Alethia Berthold, MD 05/14/2018 5:29 PM
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
## 2018-05-14 NOTE — Progress Notes (Signed)
Patient ID: Oscar ParadiseMark Duke, male   DOB: 03/19/1971, 47 y.o.   MRN: [id number] Per State regulations 482.30 this chart was reviewed for medical necessity with respect to the patient's admission/duration of stay.    Next review date: 05/18/18  Thurman CoyerEric Jerremy Maione, BSN, RN-BC  Case Manager

## 2018-05-14 NOTE — ED Notes (Signed)
Spoke with [facility] of [facility]) [phone]. She states patient just came to facility 05/12/18 and she sent him to [facility] with FL2. She states patient does not have a legal guardian per her paperwork.

## 2018-05-14 NOTE — BH Assessment (Signed)
[facility] to [facility] by Dr. Toni Amendlapacs.  Attending Physician will be Dr. Flora Lipps'Neal.   Patient has been assigned to room 303-A, [facility] Charge Nurse Lillette BoxerGwen F.   ER staff is aware of the admission:  Davy PiqueLuan, ER Sectary   Dr. Darnelle CatalanMalinda, ER MD   Ethelene BrownsAnthony, Patient Access.

## 2018-05-14 NOTE — Progress Notes (Addendum)
Pt was brought in from the Emergency room at around 20:30. Pt still was sedated from having Geodon at 1830 for being agitated. Pt unsteady on his feet, and encouraged pt  several times to sit down. Vitals were stable. Pt denies SI/HI. Pt is disorganized and rambles on at times about different subjects. Pt is a Insulin dependent/Diabetic. Blood sugar taken prior to being sent to the unit and it was 128. Pt put on moderate falls risk, stating he has fallen in the past with taking the medications. Pt is very hypersexual and had to be redirected several times. Pt is receptive to treatment and safety maintained on unit. Will continue to monitor.

## 2018-05-14 NOTE — Consult Note (Signed)
Psychiatry: Brief note full note to follow.  Patient remains disorganized agitated flight of ideas loud aggressive unmanageable at times.  Needs inpatient treatment.  Continue IV C work on plans for admission.

## 2018-05-14 NOTE — Tx Team (Signed)
Initial Treatment Plan 05/14/2018 11:39 PM Oscar ParadiseMark Capwell [id number]    PATIENT STRESSORS: Financial difficulties Health problems   PATIENT STRENGTHS: Active sense of humor Motivation for treatment/growth   PATIENT IDENTIFIED PROBLEMS: To not get angered easily  Take medications appropriate                   DISCHARGE CRITERIA:  Adequate post-discharge living arrangements Medical problems require only outpatient monitoring  PRELIMINARY DISCHARGE PLAN: Attend aftercare/continuing care group Return to previous living arrangement  PATIENT/FAMILY INVOLVEMENT: This treatment plan has been presented to and reviewed with the patient, Oscar Duke.  The patient has been given the opportunity to ask questions and make suggestions.  Wylene Simmerameka  Kiara Keep, RN 05/14/2018, 11:39 PM
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
## 2018-05-15 DIAGNOSIS — F250 Schizoaffective disorder, bipolar type: Secondary | ICD-10-CM

## 2018-05-15 LAB — GLUCOSE, CAPILLARY
GLUCOSE-CAPILLARY: 156 mg/dL — AB (ref 70–99)
GLUCOSE-CAPILLARY: 127 mg/dL — AB (ref 70–99)
Glucose-Capillary: 102 mg/dL — ABNORMAL HIGH (ref 70–99)
Glucose-Capillary: 117 mg/dL — ABNORMAL HIGH (ref 70–99)

## 2018-05-15 LAB — TSH: TSH: 2.117 u[IU]/mL (ref 0.350–4.500)

## 2018-05-15 LAB — LIPID PANEL
CHOLESTEROL: 93 mg/dL (ref 0–200)
HDL: 45 mg/dL (ref >40)
LDL CALC: 36 mg/dL (ref 0–99)
Total CHOL/HDL Ratio: 2.1 RATIO
Triglycerides: 61 mg/dL (ref <150)
VLDL: 12 mg/dL (ref 0–40)

## 2018-05-15 LAB — HEMOGLOBIN A1C
Hgb A1c MFr Bld: 5.6 % (ref 4.8–5.6)
Mean Plasma Glucose: 114.02 mg/dL

## 2018-05-15 MED ORDER — IBUPROFEN 200 MG PO TABS
400.0000 mg | ORAL_TABLET | Freq: Four times a day (QID) | ORAL | Status: DC | PRN
  Administered 2018-05-15 – 2018-05-28 (×4): 400 mg via ORAL
  Filled 2018-05-15 (×4): qty 2
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
## 2018-05-15 NOTE — Plan of Care (Signed)
Data: Patient is not cooperative to assessment. Patient endorses passive SI/HI and unable to provide information to assess for AVH. Patient has completed daily self inventory worksheet. Patient is very agitated, verbally aggressive, and confrontational with staff and peers. Patient requires frequent redirection. Patient is verbally hostile with peers and had multiple confrontation today.  Patient has multiple complaints of agitation, anger, sadness and had a pain rating of 0/10. Patient reports good sleep quality, appetite is good. Patient rates depression "0/10" , feelings of hopelessness "0/10" and anxiety "0/10" Patients goal for today is "having a outstanding day."  Action:  Q x 15 minute observation checks were completed for safety. Patient was provided with education on medications. Patient was offered support and encouragement. Patient was given scheduled medications. Patient  was encourage to attend groups, participate in unit activities and continue with plan of care.    Response: Patient is medication compliant. Patient safety maintained on unit.    Problem: Safety: Goal: Periods of time without injury will increase Outcome: Progressing   Problem: Education: Goal: Knowledge of  General Education information/materials will improve Outcome: Not Progressing Goal: Emotional status will improve Outcome: Not Progressing Goal: Mental status will improve Outcome: Not Progressing
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
## 2018-05-15 NOTE — Progress Notes (Signed)
At about 3:30, Pt started yelling, "help, help! Several of us, staff, went rushing in to check on patient. Patient was sitting on floor, and it looked like he had just come out of bathroom/shower. patient was wet. patient stated he slid from bathroom into the room. Patient stated he fell on his bottom, but patients room is near nursing station and we did not hear any commotion prior to patient yelling out help. No red, broken or bruised spots noted on patient. Helped patient dry off and assisted him up to sit on bed. Patients vitals were taken and they were stable. Helped patient with getting his clothes on and he was encouraged to get back in bed in which he did. Shortly after patient went back to sleep. Notified Dr. Jennet MaduroPucilowska of the fall, and no actions taken at this time.
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
## 2018-05-16 DIAGNOSIS — F250 Schizoaffective disorder, bipolar type: Secondary | ICD-10-CM

## 2018-05-16 LAB — GLUCOSE, CAPILLARY
Glucose-Capillary: 158 mg/dL — ABNORMAL HIGH (ref 70–99)
Glucose-Capillary: 190 mg/dL — ABNORMAL HIGH (ref 70–99)
Glucose-Capillary: 131 mg/dL — ABNORMAL HIGH (ref 70–99)

## 2018-05-16 MED ORDER — LORAZEPAM 2 MG/ML IJ SOLN: 2.0000 mg | INTRAMUSCULAR | Status: DC | PRN

## 2018-05-16 MED ORDER — LORAZEPAM 2 MG PO TABS
2.0000 mg | ORAL_TABLET | ORAL | Status: DC | PRN
  Administered 2018-05-16 – 2018-05-27 (×5): 2 mg via ORAL
  Filled 2018-05-16 (×5): qty 1

## 2018-05-16 MED ORDER — ZIPRASIDONE MESYLATE 20 MG IM SOLR: 20.0000 mg | Freq: Once | INTRAMUSCULAR | Status: DC

## 2018-05-16 MED ORDER — ZIPRASIDONE MESYLATE 20 MG IM SOLR
20.0000 mg | Freq: Once | INTRAMUSCULAR | Status: AC | PRN
Start: 2018-05-16 — End: 2018-05-17
  Administered 2018-05-17: 20 mg via INTRAMUSCULAR
  Filled 2018-05-16: qty 20

## 2018-05-16 NOTE — Plan of Care (Signed)
Patient continued to making  racial name calling to staff and peers, yelling loud and slamming doors when redirected.

## 2018-05-16 NOTE — Progress Notes (Signed)
[facility] MD Progress Note  05/16/2018 1:47 PM Oscar Duke  MRN:  [id number] Subjective:  Pt was quite manic yesterday, talkative, and intermittently agitated.  He did not sleep well overnight, only sleeping ~4 hrs. This morning and afternoon, pt has been in his room asleep.  I went to check on him this morning and pt was only briefly arousable. This afternoon, pt was able to awaken long enough for me to ask how he was doing. Pt states he's "fine"..."just tired."  Pt was able to appropriately move his legs and arms on command.  He asks if he could just go back to sleep.   No voiced hallucinations, SI, HI.        Principal Problem: Schizoaffective disorder, bipolar type ([place]) Diagnosis:   Patient Active Problem List   Diagnosis Date Noted  . Schizoaffective disorder, bipolar type ([place]) [F25.0] 05/14/2018  . Somnolence [R40.0] 10/05/2017  . Adverse drug interaction with prescription medication [T50.905A] 10/05/2017  . Hypertension [I10]   . Diabetes mellitus without complication ([place]) [H21.2]   . Bipolar 1 disorder ([place]) [F31.9]   . Seizures ([place]) [R56.9]   . Schizophrenia, acute ([place]) [F23] 05/14/2017   Total Time spent with patient: 15 minutes; however, an additional 20 min reviewing patient's chart and talking with staff.    Past Psychiatric History: Multiple previous psych hospitalizations-[place], [facility] Rima Pala.  Pt with longstanding hx of mental illness (schizophrenia vs schizoaffective, bipolar type).    Past Medical History:  Past Medical History:  Diagnosis Date  . Bipolar 1 disorder ([place])   . Diabetes mellitus without complication ([place])   . Hyperlipidemia   . Hypertension   . Manic affective disorder with recurrent episode ([place])   . Schizophrenia, acute ([place])   . Seizures ([place])     Past Surgical History:  Procedure Laterality Date  . JOINT REPLACEMENT     Family History: History reviewed. No pertinent family history. Family Psychiatric  History:  unknown Social History:  Social History   Substance and Sexual Activity  Alcohol Use No     Social History   Substance and Sexual Activity  Drug Use Yes  . Types: Marijuana   Comment: quit 5 years ago    Social History   Socioeconomic History  . Marital status: Single    Spouse name: Not on file  . Number of children: Not on file  . Years of education: Not on file  . Highest education level: Not on file  Occupational History  . Not on file  Social Needs  . Financial resource strain: Not on file  . Food insecurity:    Worry: Not on file    Inability: Not on file  . Transportation needs:    Medical: Not on file    Non-medical: Not on file  Tobacco Use  . Smoking status: Never Smoker  . Smokeless tobacco: Never Used  Substance and Sexual Activity  . Alcohol use: No  . Drug use: Yes    Types: Marijuana    Comment: quit 5 years ago  . Sexual activity: Not on file  Lifestyle  . Physical activity:    Days per week: Not on file    Minutes per session: Not on file  . Stress: Not on file  Relationships  . Social connections:    Talks on phone: Not on file    Gets together: Not on file    Attends religious service: Not on file    Active member of club or organization:  Not on file    Attends meetings of clubs or organizations: Not on file    Relationship status: Not on file  Other Topics Concern  . Not on file  Social History Narrative  . Not on file   Additional Social History: pt was living in a group home prior to hospitalization.  He has a cousin, Shirlee More, that pt states is his ?HCPOA.    Sleep: poor overnight, but has sleeping this morning and afternoon.   Appetite:  Fair  Current Medications: Current Facility-Administered Medications  Medication Dose Route Frequency Provider Last Rate Last Dose  . acetaminophen (TYLENOL) tablet 650 mg  650 mg Oral Q6H PRN Clapacs, Madie Reno, MD   650 mg at 05/16/18 0310  . alum & mag hydroxide-simeth  (MAALOX/MYLANTA) 200-200-20 MG/5ML suspension 30 mL  30 mL Oral Q4H PRN Clapacs, John T, MD      . amLODipine (NORVASC) tablet 5 mg  5 mg Oral Daily Clapacs, Madie Reno, MD   5 mg at 05/15/18 2774  . benztropine (COGENTIN) tablet 1 mg  1 mg Oral BID Clapacs, Madie Reno, MD   1 mg at 05/15/18 1618  . clonazePAM (KLONOPIN) tablet 1 mg  1 mg Oral TID Clapacs, Madie Reno, MD   1 mg at 05/15/18 1618  . divalproex (DEPAKOTE) DR tablet 750 mg  750 mg Oral Q8H Pucilowska, Jolanta B, MD   750 mg at 05/16/18 0622  . hydrOXYzine (ATARAX/VISTARIL) tablet 50 mg  50 mg Oral Q4H PRN Clapacs, John T, MD   50 mg at 05/16/18 0310  . ibuprofen (ADVIL,MOTRIN) tablet 400 mg  400 mg Oral Q6H PRN Tennis Ship, MD   400 mg at 05/15/18 1230  . insulin aspart (novoLOG) injection 0-15 Units  0-15 Units Subcutaneous TID WC Clapacs, Madie Reno, MD   3 Units at 05/15/18 1226  . insulin detemir (LEVEMIR) injection 28 Units  28 Units Subcutaneous Daily Clapacs, Madie Reno, MD   28 Units at 05/15/18 0908  . lactulose (CHRONULAC) 10 GM/15ML solution 10 g  10 g Oral BID Clapacs, Madie Reno, MD   10 g at 05/15/18 1618  . levETIRAcetam (KEPPRA) tablet 1,000 mg  1,000 mg Oral QHS Clapacs, John T, MD   1,000 mg at 05/15/18 2109  . lisinopril (PRINIVIL,ZESTRIL) tablet 20 mg  20 mg Oral Daily Clapacs, Madie Reno, MD   20 mg at 05/15/18 1287  . magnesium hydroxide (MILK OF MAGNESIA) suspension 30 mL  30 mL Oral Daily PRN Clapacs, John T, MD      . OLANZapine (ZYPREXA) tablet 15 mg  15 mg Oral TID PRN Pucilowska, Jolanta B, MD   15 mg at 05/15/18 2038   Or  . OLANZapine (ZYPREXA) injection 10 mg  10 mg Intramuscular TID PRN Pucilowska, Jolanta B, MD      . pantoprazole (PROTONIX) EC tablet 40 mg  40 mg Oral Daily Clapacs, Madie Reno, MD   40 mg at 05/15/18 0842  . perphenazine (TRILAFON) tablet 8 mg  8 mg Oral TID Clapacs, Madie Reno, MD   8 mg at 05/15/18 1618  . traZODone (DESYREL) tablet 150 mg  150 mg Oral QHS Clapacs, Madie Reno, MD   150 mg at 05/15/18 2108    Lab  Results:  Results for orders placed or performed during the hospital encounter of 05/14/18 (from the past 48 hour(s))  Basic metabolic panel     Status: Abnormal   Collection Time: 05/14/18  9:24 PM  Result Value Ref Range   Sodium 135 135 - 145 mmol/L   Potassium 3.6 3.5 - 5.1 mmol/L   Chloride 99 98 - 111 mmol/L    Comment: Please note change in reference range.   CO2 29 22 - 32 mmol/L   Glucose, Bld 143 (H) 70 - 99 mg/dL    Comment: Please note change in reference range.   BUN 8 6 - 20 mg/dL    Comment: Please note change in reference range.   Creatinine, Ser 0.79 0.61 - 1.24 mg/dL   Calcium 9.3 8.9 - 10.3 mg/dL   GFR calc non Af Amer >60 >60 mL/min   GFR calc Af Amer >60 >60 mL/min    Comment: (NOTE) The eGFR has been calculated using the CKD EPI equation. This calculation has not been validated in all clinical situations. eGFR's persistently <60 mL/min signify possible Chronic Kidney Disease.    Anion gap 7 5 - 15    Comment: Performed at [facility], [address] [place], [place] [address]  Valproic acid level     Status: None   Collection Time: 05/14/18  9:24 PM  Result Value Ref Range   Valproic Acid Lvl 60 50.0 - 100.0 ug/mL    Comment: Performed at [facility], [address] [place], [place] [address]  Hemoglobin A1c     Status: None   Collection Time: 05/15/18  6:25 AM  Result Value Ref Range   Hgb A1c MFr Bld 5.6 4.8 - 5.6 %    Comment: (NOTE) Pre diabetes:          5.7%-6.4% Diabetes:              >6.4% Glycemic control for   <7.0% adults with diabetes    Mean Plasma Glucose 114.02 mg/dL    Comment: Performed at [facility] [address] [place], [place] [address]  Lipid panel     Status: None   Collection Time: 05/15/18  6:25 AM  Result Value Ref Range   Cholesterol 93 0 - 200 mg/dL   Triglycerides 61 <150 mg/dL   HDL 45 >40 mg/dL   Total CHOL/HDL Ratio 2.1 RATIO   VLDL 12 0 - 40 mg/dL   LDL Cholesterol 36 0 - 99  mg/dL    Comment:        Total Cholesterol/HDL:CHD Risk Coronary Heart Disease Risk Table                     Men   Women  1/2 Average Risk   3.4   3.3  Average Risk       5.0   4.4  2 X Average Risk   9.6   7.1  3 X Average Risk  23.4   11.0        Use the calculated Patient Ratio above and the CHD Risk Table to determine the patient's CHD Risk.        ATP III CLASSIFICATION (LDL):  <100     mg/dL   Optimal  100-129  mg/dL   Near or Above                    Optimal  130-159  mg/dL   Borderline  160-189  mg/dL   High  >190     mg/dL   Very High Performed at [facility], [address], [place], [place] [address]   TSH     Status:  None   Collection Time: 05/15/18  6:25 AM  Result Value Ref Range   TSH 2.117 0.350 - 4.500 uIU/mL    Comment: Performed by a 3rd Generation assay with a functional sensitivity of <=0.01 uIU/mL. Performed at [facility], [address] [place], [place] [address]   Glucose, capillary     Status: Abnormal   Collection Time: 05/15/18  6:54 AM  Result Value Ref Range   Glucose-Capillary 102 (H) 70 - 99 mg/dL   Comment 1 Notify RN   Glucose, capillary     Status: Abnormal   Collection Time: 05/15/18 12:25 PM  Result Value Ref Range   Glucose-Capillary 156 (H) 70 - 99 mg/dL  Glucose, capillary     Status: Abnormal   Collection Time: 05/15/18  4:15 PM  Result Value Ref Range   Glucose-Capillary 117 (H) 70 - 99 mg/dL  Glucose, capillary     Status: Abnormal   Collection Time: 05/15/18  9:06 PM  Result Value Ref Range   Glucose-Capillary 127 (H) 70 - 99 mg/dL  Glucose, capillary     Status: Abnormal   Collection Time: 05/16/18  7:12 AM  Result Value Ref Range   Glucose-Capillary 158 (H) 70 - 99 mg/dL    Blood Alcohol level:  Lab Results  Component Value Date   ETH <10 05/13/2018   ETH <5 03/50/0938    Metabolic Disorder Labs: Lab Results  Component Value Date   HGBA1C 5.6 05/15/2018   MPG 114.02 05/15/2018   No  results found for: PROLACTIN Lab Results  Component Value Date   CHOL 93 05/15/2018   TRIG 61 05/15/2018   HDL 45 05/15/2018   CHOLHDL 2.1 05/15/2018   VLDL 12 05/15/2018   LDLCALC 36 05/15/2018    Physical Findings: AIMS:  , ,  ,  ,    CIWA:    COWS:     Musculoskeletal: Musculoskeletal: Strength & Muscle Tone:within normal limits Gait & Station:normal Patient leans:N/A  Psychiatric Specialty Exam: Physical Exam  Nursing note and vitals reviewed. Constitutional:  Tall, misshapen head with missing/thin patches of hair    Review of Systems  Reason unable to perform ROS: pt is asleep and only arousable briefly.  Skin:       Skin discoloration on left lower leg (pt state it's due to taking "januvia")     Blood pressure 122/80, pulse (!) 114, temperature 97.9 F (36.6 C), temperature source Oral, resp. rate 18, height '6\' 4"'$  (1.93 m), weight 110.7 kg (244 lb), SpO2 (!) 70 %.Body mass index is 29.7 kg/m.  General Appearance: Disheveled  Eye Contact:  None  Speech:  Garbled b/c he's sleepy  Volume:  Normal  Mood:  "I'm fine, just tired"  Affect:  mood congruent  Thought Process:  Unable to assess today  Orientation:  Full (Time, Place, and Person)  Thought Content:  Unable to assess today  Suicidal Thoughts:  none voiced  Homicidal Thoughts:  none voiced  Memory:  Unable to assess today  Judgement:  Poor  Insight:  Lacking  Psychomotor Activity:  Unable to assess today as pt is asleep most of the day  Concentration:  Concentration: Unable to assess today  Recall:  NA  Fund of Knowledge:  NA  Language:  Fair  Akathisia:  No  AIMS (if indicated):   0  Assets:  Others:  has established outpt mental health care  ADL's: pt did not get up to tend to ADL's today; pt appears to  be asleep in the same clothes he wore all day yesterday  Cognition:  Impaired,  Mild  Sleep:  Number of Hours: 4(Simultaneous filing. User may not have seen previous data.)     Treatment  Plan Summary: Treatment Plan Summary:  Schizoaffective, bipolar type HTN Diabetes  Seizure disorder Constipation GERD  Daily contact with patient to assess and evaluate symptoms and progress in treatment and Medication management   Plan: Schizoaffective disorder, bipolar type -perphenazine 8 mg TID -depakote 750 mg TID -klonopin 1 mg TID -cogentin 1 mg BID for EPS prophylaxis -trazodone 150 mg qhs  HTN -Norvasc 5 mg qd -lisinopril 20 mg qd  Diabetes -insulin as precribed (Levemir 28 units qd; shorting acting sliding scale)  Seizure disorder -Keppra 1000 mg qhs  Constipation -lactulose BID -Milk of magnesia daily PRN   GERD -protonix 40 mg qd   Observation Level/Precautions:  15 minute checks, seizure precautions  Laboratory:  CBC-reviewed Chemistry Profile-reviewed HbAIC-5.6 UDS-Positive for benzos lipid panel-Total Cholesterol 93, HDL 45, LDL 36, Trig 61 TSH-2.117 Valproic Acid level 60 on 05/14/18 (prior to increase of depakote to current dose) EKG-QTc 447  Psychotherapy:  Group therapies when appropriate  Medications:  See above  Consultations:  N/A  Discharge: group home    Estimated LOS: 5-7 days; tentative discharge-late next week        Tennis Ship, MD 05/16/2018, 1:47 PM

## 2018-05-16 NOTE — Tx Team (Addendum)
Interdisciplinary Treatment and Diagnostic Plan Update  05/16/2018 Time of Session: 11:10 AM Oscar Duke MRN: [id number]  Principal Diagnosis: Schizoaffective disorder, bipolar type (HCC)  Secondary Diagnoses: Principal Problem:   Schizoaffective disorder, bipolar type (HCC)   Current Medications:  Current Facility-Administered Medications  Medication Dose Route Frequency Provider Last Rate Last Dose  . acetaminophen (TYLENOL) tablet 650 mg  650 mg Oral Q6H PRN Clapacs, Jackquline DenmarkJohn T, MD   650 mg at 05/16/18 0310  . alum & mag hydroxide-simeth (MAALOX/MYLANTA) 200-200-20 MG/5ML suspension 30 mL  30 mL Oral Q4H PRN Clapacs, John T, MD      . amLODipine (NORVASC) tablet 5 mg  5 mg Oral Daily Clapacs, Jackquline DenmarkJohn T, MD   5 mg at 05/15/18 [id number]  . benztropine (COGENTIN) tablet 1 mg  1 mg Oral BID Clapacs, Jackquline DenmarkJohn T, MD   1 mg at 05/15/18 1618  . clonazePAM (KLONOPIN) tablet 1 mg  1 mg Oral TID Clapacs, Jackquline DenmarkJohn T, MD   1 mg at 05/15/18 1618  . divalproex (DEPAKOTE) DR tablet 750 mg  750 mg Oral Q8H Pucilowska, Jolanta B, MD   750 mg at 05/16/18 0622  . hydrOXYzine (ATARAX/VISTARIL) tablet 50 mg  50 mg Oral Q4H PRN Clapacs, John T, MD   50 mg at 05/16/18 0310  . ibuprofen (ADVIL,MOTRIN) tablet 400 mg  400 mg Oral Q6H PRN [name]'Neal, Sarita, MD   400 mg at 05/15/18 1230  . insulin aspart (novoLOG) injection 0-15 Units  0-15 Units Subcutaneous TID WC Clapacs, Jackquline DenmarkJohn T, MD   3 Units at 05/15/18 1226  . insulin detemir (LEVEMIR) injection 28 Units  28 Units Subcutaneous Daily Clapacs, Jackquline DenmarkJohn T, MD   28 Units at 05/15/18 0908  . lactulose (CHRONULAC) 10 GM/15ML solution 10 g  10 g Oral BID Clapacs, Jackquline DenmarkJohn T, MD   10 g at 05/15/18 1618  . levETIRAcetam (KEPPRA) tablet 1,000 mg  1,000 mg Oral QHS Clapacs, John T, MD   1,000 mg at 05/15/18 2109  . lisinopril (PRINIVIL,ZESTRIL) tablet 20 mg  20 mg Oral Daily Clapacs, Jackquline DenmarkJohn T, MD   20 mg at 05/15/18 [id number]  . magnesium hydroxide (MILK OF MAGNESIA) suspension 30 mL  30 mL Oral Daily PRN  Clapacs, John T, MD      . OLANZapine (ZYPREXA) tablet 15 mg  15 mg Oral TID PRN Pucilowska, Jolanta B, MD   15 mg at 05/15/18 2038   Or  . OLANZapine (ZYPREXA) injection 10 mg  10 mg Intramuscular TID PRN Pucilowska, Jolanta B, MD      . pantoprazole (PROTONIX) EC tablet 40 mg  40 mg Oral Daily Clapacs, Jackquline DenmarkJohn T, MD   40 mg at 05/15/18 0842  . perphenazine (TRILAFON) tablet 8 mg  8 mg Oral TID Clapacs, Jackquline DenmarkJohn T, MD   8 mg at 05/15/18 1618  . traZODone (DESYREL) tablet 150 mg  150 mg Oral QHS Clapacs, Jackquline DenmarkJohn T, MD   150 mg at 05/15/18 2108   PTA Medications: Medications Prior to Admission  Medication Sig Dispense Refill Last Dose  . amLODipine (NORVASC) 10 MG tablet Take 10 mg by mouth daily.   11/03/2017 at Unknown time  . atorvastatin (LIPITOR) 40 MG tablet Take 40 mg by mouth every evening.   11/02/2017 at Unknown time  . benztropine (COGENTIN) 1 MG tablet Take 1 mg by mouth 2 (two) times daily.    11/03/2017 at Unknown time  . Cholecalciferol (VITAMIN D3) 2000 units TABS Take 2,000 Units by mouth daily.  11/03/2017 at 800a  . clonazePAM (KLONOPIN) 1 MG tablet Take 1 tablet (1 mg total) by mouth 3 (three) times daily as needed for anxiety. (Patient taking differently: Take 1 mg by mouth 3 (three) times daily. )   11/03/2017 at Unknown time  . cyclobenzaprine (FLEXERIL) 10 MG tablet Take 1 tablet (10 mg total) by mouth 3 (three) times daily as needed. 21 tablet 0   . divalproex (DEPAKOTE) 500 MG DR tablet Take 2,000 mg by mouth at bedtime.   11/02/2017 at 2000  . hydrOXYzine (VISTARIL) 50 MG capsule Take 50 mg by mouth every 4 (four) hours as needed.   unknown  . ibuprofen (ADVIL,MOTRIN) 800 MG tablet Take 1 tablet (800 mg total) by mouth every 6 (six) hours as needed. Take with food 21 tablet 0   . insulin detemir (LEVEMIR) 100 UNIT/ML injection Inject 28 Units into the skin daily.   11/03/2017 at Unknown time  . Lactulose 20 GM/30ML SOLN Take 30 mLs by mouth daily.   11/03/2017 at Unknown time   . levETIRAcetam (KEPPRA) 1000 MG tablet Take 1,000 mg by mouth at bedtime.   11/02/2017 at 2000  . lisinopril (PRINIVIL,ZESTRIL) 20 MG tablet Take 20 mg by mouth daily.   11/03/2017 at Unknown time  . omeprazole (PRILOSEC) 20 MG capsule Take 20 mg by mouth daily.   11/03/2017 at Unknown time  . perphenazine (TRILAFON) 8 MG tablet Take 4-8 mg by mouth 3 (three) times daily. 4mg  twice daily and 8mg  tab at bedtime   11/03/2017 at 800a  . propranolol (INDERAL) 10 MG tablet Take 10 mg by mouth 2 (two) times daily.   11/03/2017 at 800a  . sodium chloride 1 g tablet Take 1 g by mouth daily.   11/03/2017 at Unknown time  . traZODone (DESYREL) 150 MG tablet Take 300 mg by mouth at bedtime.    11/02/2017 at Unknown time    Patient Stressors: Financial difficulties Health problems  Patient Strengths: Active sense of humor Motivation for treatment/growth  Treatment Modalities: Medication Management, Group therapy, Case management,  1 to 1 session with clinician, Psychoeducation, Recreational therapy.   Physician Treatment Plan for Primary Diagnosis: Schizoaffective disorder, bipolar type (HCC) Long Term Goal(s): Improvement in symptoms so as ready for discharge Improvement in symptoms so as ready for discharge   Short Term Goals: Ability to verbalize feelings will improve Ability to disclose and discuss suicidal ideas Compliance with prescribed medications will improve Ability to disclose and discuss suicidal ideas Ability to identify and develop effective coping behaviors will improve Compliance with prescribed medications will improve Ability to identify triggers associated with substance abuse/mental health issues will improve  Medication Management: Evaluate patient's response, side effects, and tolerance of medication regimen.  Therapeutic Interventions: 1 to 1 sessions, Unit Group sessions and Medication administration.  Evaluation of Outcomes: Not Progressing  Physician Treatment  Plan for Secondary Diagnosis: Principal Problem:   Schizoaffective disorder, bipolar type (HCC)  Long Term Goal(s): Improvement in symptoms so as ready for discharge Improvement in symptoms so as ready for discharge   Short Term Goals: Ability to verbalize feelings will improve Ability to disclose and discuss suicidal ideas Compliance with prescribed medications will improve Ability to disclose and discuss suicidal ideas Ability to identify and develop effective coping behaviors will improve Compliance with prescribed medications will improve Ability to identify triggers associated with substance abuse/mental health issues will improve     Medication Management: Evaluate patient's response, side effects, and tolerance of medication regimen.  Therapeutic Interventions: 1 to 1 sessions, Unit Group sessions and Medication administration.  Evaluation of Outcomes: Not Progressing   RN Treatment Plan for Primary Diagnosis: Schizoaffective disorder, bipolar type (HCC) Long Term Goal(s): Knowledge of disease and therapeutic regimen to maintain health will improve  Short Term Goals: Ability to demonstrate self-control, Ability to participate in decision making will improve, Ability to identify and develop effective coping behaviors will improve and Compliance with prescribed medications will improve  Medication Management: RN will administer medications as ordered by provider, will assess and evaluate patient's response and provide education to patient for prescribed medication. RN will report any adverse and/or side effects to prescribing provider.  Therapeutic Interventions: 1 on 1 counseling sessions, Psychoeducation, Medication administration, Evaluate responses to treatment, Monitor vital signs and CBGs as ordered, Perform/monitor CIWA, COWS, AIMS and Fall Risk screenings as ordered, Perform wound care treatments as ordered.  Evaluation of Outcomes: Not Progressing   LCSW Treatment Plan  for Primary Diagnosis: Schizoaffective disorder, bipolar type (HCC) Long Term Goal(s): Safe transition to appropriate next level of care at discharge, Engage patient in therapeutic group addressing interpersonal concerns.  Short Term Goals: Engage patient in aftercare planning with referrals and resources and Increase skills for wellness and recovery  Therapeutic Interventions: Assess for all discharge needs, 1 to 1 time with Social worker, Explore available resources and support systems, Assess for adequacy in community support network, Educate family and significant other(s) on suicide prevention, Complete Psychosocial Assessment, Interpersonal group therapy.  Evaluation of Outcomes: Not Progressing   Progress in Treatment: Attending groups: No. Participating in groups: No. Taking medication as prescribed: No. Toleration medication: Yes. Family/Significant other contact made: No, will contact:  Pt has not given consent for a support person to be contacted at this time. Patient understands diagnosis: No. Discussing patient identified problems/goals with staff: Yes. Medical problems stabilized or resolved: Yes. Denies suicidal/homicidal ideation: Yes. Issues/concerns per patient self-inventory: No. Other: n/a  New problem(s) identified: No, Describe:  No new problems identified  New Short Term/Long Term Goal(s):  Patient Goals:  Pt refused to come to treatment team  Discharge Plan or Barriers: Tentative discharge plan is for pt to return to his group home and resume outpatient services for medication management.  Reason for Continuation of Hospitalization: Aggression Mania Medication stabilization  Estimated Length of Stay: 7-10 days   Recreational Therapy: Patient Stressors: N/A  Patient Goal: Patient will engage in groups with a calm and appropriate mood at least 2x within 5 recreation therapy group sessions  Attendees: Patient: 05/16/2018 12:48 PM  Physician: Donita Brooks, MD 05/16/2018 12:48 PM  Nursing:  05/16/2018 12:48 PM  RN Care Manager: 05/16/2018 12:48 PM  Social Worker: Huey Romans, LCSW 05/16/2018 12:48 PM  Recreational Therapist:  05/16/2018 12:48 PM  Other: Heidi Dach, LCSW 05/16/2018 12:48 PM  Other: Damian Leavell, Chaplain 05/16/2018 12:48 PM  Other: 05/16/2018 12:48 PM    Scribe for Treatment Team: Alease Frame, LCSW 05/16/2018 12:48 PM

## 2018-05-16 NOTE — Plan of Care (Signed)
Patient was sleeping till this afternoon.Patient is hyper verbal and verbally aggressive at times.Denies SI,HI and AVH.Appetite and energy level good.Visible in the milieu.Compliant with medications.Support and encouragement given.

## 2018-05-16 NOTE — Plan of Care (Signed)
Patient is relentless with derogatory remarks on peers and staff and not easily redirected , staff tried redirecting patient severally with out success,de-escalating strategies used to have patient to quiet and safe area continues to use foul language to talk to people, comply with his medications, no aggressions, sleep is minimal in his room and out of his, PRN medication given , with effect, noted monitored for safety constantly. Problem: Coping: Goal: Ability to identify and develop effective coping behavior will improve Outcome: Not Progressing Goal: Ability to interact with others will improve Outcome: Not Progressing Goal: Demonstration of participation in decision-making regarding own care will improve Outcome: Not Progressing Goal: Ability to use eye contact when communicating with others will improve Outcome: Not Progressing   Problem: Self-Concept: Goal: Will verbalize positive feelings about self Outcome: Not Progressing   Problem: Education: Goal: Knowledge of [place] [facility] Education information/materials will improve Outcome: Not Progressing Goal: Emotional status will improve Outcome: Not Progressing Goal: Mental status will improve Outcome: Not Progressing Goal: Verbalization of understanding the information provided will improve Outcome: Not Progressing   Problem: Safety: Goal: Periods of time without injury will increase Outcome: Not Progressing

## 2018-05-16 NOTE — [facility] Group Notes (Signed)
LCSW Group Therapy Note  05/16/2018 1:00 pm  Type of Therapy and Topic:  Group Therapy:  Feelings around Relapse and Recovery  Participation Level:  Did Not Attend   Description of Group:    Patients in this group will discuss emotions they experience before and after a relapse. They will process how experiencing these feelings, or avoidance of experiencing them, relates to having a relapse. Facilitator will guide patients to explore emotions they have related to recovery. Patients will be encouraged to process which emotions are more powerful. They will be guided to discuss the emotional reaction significant others in their lives may have to their relapse or recovery. Patients will be assisted in exploring ways to respond to the emotions of others without this contributing to a relapse.  Therapeutic Goals: 1. Patient will identify two or more emotions that lead to a relapse for them 2. Patient will identify two emotions that result when they relapse 3. Patient will identify two emotions related to recovery 4. Patient will demonstrate ability to communicate their needs through discussion and/or role plays   Summary of Patient Progress:  Oscar Duke was invited to today's group, but chose not to attend.   Therapeutic Modalities:   Cognitive Behavioral Therapy Solution-Focused Therapy Assertiveness Training Relapse Prevention Therapy   Alease FrameSonya S Lashane Whelpley, LCSW 05/16/2018 1:41 PM

## 2018-05-17 ENCOUNTER — Inpatient Hospital Stay: Payer: Medicare Other

## 2018-05-17 DIAGNOSIS — F250 Schizoaffective disorder, bipolar type: Principal | ICD-10-CM

## 2018-05-17 LAB — GLUCOSE, CAPILLARY
GLUCOSE-CAPILLARY: 281 mg/dL — AB (ref 70–99)
Glucose-Capillary: 191 mg/dL — ABNORMAL HIGH (ref 70–99)
Glucose-Capillary: 149 mg/dL — ABNORMAL HIGH (ref 70–99)
Glucose-Capillary: 178 mg/dL — ABNORMAL HIGH (ref 70–99)

## 2018-05-17 MED ORDER — TRAZODONE HCL 100 MG PO TABS
200.0000 mg | ORAL_TABLET | Freq: Every day | ORAL | Status: DC
  Administered 2018-05-17: 200 mg via ORAL
  Filled 2018-05-17: qty 2

## 2018-05-17 NOTE — [facility] Group Notes (Signed)
[facility] Group Notes:  (Nursing/MHT/Case Management/Adjunct)  Date:  05/17/2018  Time:  10:13 PM  Type of Therapy:  Group Therapy  Participation Level:  Active  Participation Quality:  Monopolizing  Affect:  Anxious  Cognitive:  Alert  Insight:  Improving  Engagement in Group:  Distracting and Engaged  Modes of Intervention:  Discussion  Summary of Progress/Problems: MHT reviewed rules and expectations of the unit. MHT informed patients of visitation and phone hours. MHT informed patients of routine checks throughout the night and reminded to cover up. MHT engaged patients with an introduction game. Game was to make 3 statements, 2 being true, one being false and the group deciding which was false. MHT had patients tell goal for the day and if they were able to accomplish it. MHT reviewed what was discussed in groups on this day, recovery and if they learned anything useful to do when released. MHT discussed the thought, feeling, behavioral model. MHT informed patients not to dwell on the negative things in life, but to focus on what was needed to make the change. MHT encouraged patients not to dwell on things they had no control over. MHT encouraged patients to make changes by doing things differently. MHT encouraged patients to make changes that would promote positive changes in their lives. Oscar Duke participated in group. Oscar Duke needed to be redirected several times to stop interrupting other patients while talking. Oscar Duke did not share his goal with group. Oscar Duke stated he would be leaving once his guardian came down on Monday.  Oscar NeighborsKeith D Cathleen Duke 05/17/2018, 10:13 PM

## 2018-05-17 NOTE — Plan of Care (Addendum)
Patient found in common area upon my arrival. Patient is visible and verbally abusive/threatening throughout the evening. Patient is yelling at staff, screaming out when he does not get what he wants from staff or peers, threatening staff with physical violence, cursing, threatening with lawsuits, sexually inappropriate, and racist. Patient is grandiose and paranoid. Refused to answer assessment questions. Only verbal when cursing at or threatening people. Patient is nasty and belligerent. Patient feels he can dictate what he can do, what and when he eats, how long group last, etc. Patient is extremely disruptive to the milieu. Patient is eating massive amounts of food and then yelling, "I'm still hungry!"  Dr. Demetrius CharityP contacted regarding patient being out of control and screaming that he wants Ativan. Ativan 2mg  Q4 PRN ordered and administered with vistaril. Also ordered Geodon 20mg  IM x 1 now with one repeat dose allowed if first dose is inadequate. Patient refused shot and went to his room after Ativan given @ 2340. Patient came to nursing station @0100 , barely able to hold his eyes open stating, "I can't relax. I need more to sleep. I slept all day." Patient was told that he is too sedated and that he should return to bed. Patient returned to bed while cursing. Q 15 minute checks maintained. Will continue to monitor throughout the shift. @ 0200, patient approaches staff and begins yelling, cursing, calling names. When asked to keep his voice down, patient starts just yelling as loud as he can. Geodon 20mg  IM administered with several staff in attendance. Patient stated, "You ain't gonna give none of that shit! I will punch you in the nose and him (indicating myself and a male tech). Then 3 security guards and 3 other staff walked into the room and patient states, "Oh, I'll take it. That's too many people. I can't take you all." Patient received shot without need to be held. Patient screamed when stuck with the  needle. Will monitor for efficacy. Patient slept 4.25 hours. Fell asleep after Geodon IM. Woke in nasty mood. Abusing staff verbally. CBG 191. Given 3 units Novolog coverage and Levemir 28 units scheduled. Will endorse care to oncoming shift.  Problem: Coping: Goal: Ability to use eye contact when communicating with others will improve Outcome: Progressing   Problem: Self-Concept: Goal: Will verbalize positive feelings about self Outcome: Progressing   Problem: Coping: Goal: Ability to identify and develop effective coping behavior will improve Outcome: Not Progressing Goal: Ability to interact with others will improve Outcome: Not Progressing Goal: Demonstration of participation in decision-making regarding own care will improve Outcome: Not Progressing   Problem: Education: Goal: Knowledge of [place] [facility] Education information/materials will improve Outcome: Not Progressing Goal: Emotional status will improve Outcome: Not Progressing Goal: Mental status will improve Outcome: Not Progressing Goal: Verbalization of understanding the information provided will improve Outcome: Not Progressing

## 2018-05-17 NOTE — Plan of Care (Signed)
D: Patient denies SI/HI/AVH. Patient verbally contracts for safety. Patient is agitated, verbally aggressive, hyper verbal but is cooperative. Patient is seen in milieu interacting with peers. Patient had fall today @0932 .  A: Patient was assessed by this nurse. Patient was oriented to unit. Patient's safety was maintained on unit. Q x 15 minute observation checks were completed for safety. Patient care plan was reviewed. Patient was offered support and encouragement. Patient was encourage to attend groups, participate in unit activities and continue with plan of care.    R: Patient has no complaints of pain at this time. Patient is receptive to treatment and safety maintained on unit.

## 2018-05-17 NOTE — [facility] Group Notes (Signed)
LCSW Group Therapy Note  05/17/2018 1:15pm  Type of Therapy and Topic:  Group Therapy:  Feelings around Relapse and Recovery  Participation Level:  Minimal   Description of Group:    Patients in this group will discuss emotions they experience before and after a relapse. They will process how experiencing these feelings, or avoidance of experiencing them, relates to having a relapse. Facilitator will guide patients to explore emotions they have related to recovery. Patients will be encouraged to process which emotions are more powerful. They will be guided to discuss the emotional reaction significant others in their lives may have to their relapse or recovery. Patients will be assisted in exploring ways to respond to the emotions of others without this contributing to a relapse.  Therapeutic Goals: 1. Patient will identify two or more emotions that lead to a relapse for them 2. Patient will identify two emotions that result when they relapse 3. Patient will identify two emotions related to recovery 4. Patient will demonstrate ability to communicate their needs through discussion and/or role plays   Summary of Patient Progress:  Pt disruptive, had to be asked to leave the group due to talking over others and inability to stay on task.  Responded angrily when asked to leave, slamming door, cursing, yelling as she left.   Therapeutic Modalities:   Cognitive Behavioral Therapy Solution-Focused Therapy Assertiveness Training Relapse Prevention Therapy   Glennon MacSara P Oscar Marksberry, LCSW 05/17/2018 2:09 PM

## 2018-05-17 NOTE — Progress Notes (Signed)
Attempted to obtained orthostatic vitals. Patient refused stating "I ain't doing shit until you get the sheriff to take me to god damn [place], they can come get me today, I am ready to kill some fuckers." Patient is educated on purpose and still continues to refuse. Patient is agitated, hyperverbal and verbally aggressive. Will attempt to obtain vital signs at a later time.

## 2018-05-17 NOTE — Progress Notes (Addendum)
Patient had reported fall @ 0932 on the basketball court. Patient was instructed to not run and had a witnessed fall, patient was not using provided front wheel walker. Patient struck the back of his head resulting in a contusion with scant bleeding. Patient was assisted up and transported to MD for assessment post fall. Patient had neuro assessed and is WNL. Patient is oriented x3, patient is PERLA.  Patient assessed by rounding provider, Dr. Joseph ArtSubedi, at [phone].

## 2018-05-17 NOTE — Progress Notes (Signed)
Informed rounding provider of held medication. Patient had reported that he was feeling light headed while standing and "going to fall out.", and dizzy. Patient B/P 106/73 this morning.

## 2018-05-17 NOTE — Progress Notes (Addendum)
[facility]  05/17/2018 11:45 AM Oscar Duke  MRN:  [id number] Subjective:    Pt fell on the basketball court, this am hitting head, was witnessed fall, patient was not using provided front wheel walker. Patient struck the back of his head resulting in a contusion with scant bleeding.  Ct head - no acute intracranial  findings.   Pt verbally aggressive with peer and staff , making  multiple threats to staff, pt  disruptive to milieu and requires nurses frequent attention.  Pt wandering on hallway, very intrusive, hyperbal, disorganized, reportedly sexually inappropriate. slept 4.5 hrs. AM BP med held due to fall/dizziness. 106/73 this morning, will monitor orthostatic BP if he cooperates.    Principal Problem: Schizoaffective disorder, bipolar type (HCC) Diagnosis:   Patient Active Problem List   Diagnosis Date Noted  . Schizoaffective disorder, bipolar type (HCC) [F25.0] 05/14/2018  . Somnolence [R40.0] 10/05/2017  . Adverse drug interaction with prescription medication [T50.905A] 10/05/2017  . Hypertension [I10]   . Diabetes mellitus without complication (HCC) [E11.9]   . Bipolar 1 disorder (HCC) [F31.9]   . Seizures (HCC) [R56.9]   . Schizophrenia, acute (HCC) [F23] 05/14/2017   Total Time spent with patient:25 min Past Psychiatric History: Multiple previous psych [facility], [facility].  Pt with longstanding hx of mental illness (schizophrenia vs schizoaffective, bipolar type).    Past Medical History:  Past Medical History:  Diagnosis Date  . Bipolar 1 disorder (HCC)   . Diabetes mellitus without complication (HCC)   . Hyperlipidemia   . Hypertension   . 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 Schizophrenia, acute (HCC)   . Seizures (HCC)     Past Surgical History:  Procedure Laterality Date  . JOINT REPLACEMENT     Family History: History reviewed. No pertinent family history. Family Psychiatric  History:  unknown Social History:  Social History   Substance and Sexual Activity  Alcohol Use No     Social History   Substance and Sexual Activity  Drug Use Yes  . Types: Marijuana   Comment: quit 5 years ago    Social History   Socioeconomic History  . Marital status: Single    Spouse name: Not on file  . Number of children: Not on file  . Years of education: Not on file  . Highest education level: Not on file  Occupational History  . Not on file  Social Needs  . Financial resource strain: Not on file  . Food insecurity:    Worry: Not on file    Inability: Not on file  . Transportation needs:    Medical: Not on file    Non-medical: Not on file  Tobacco Use  . Smoking status: Never Smoker  . Smokeless tobacco: Never Used  Substance and Sexual Activity  . Alcohol use: No  . Drug use: Yes    Types: Marijuana    Comment: quit 5 years ago  . Sexual activity: Not on file  Lifestyle  . Physical activity:    Days per week: Not on file    Minutes per session: Not on file  . Stress: Not on file  Relationships  . Social connections:    Talks on phone: Not on file    Gets together: Not on file    Attends religious service: Not on file    Active member of club or organization: Not on file    Attends meetings of clubs or organizations: Not on file  Relationship status: Not on file  Other Topics Concern  . Not on file  Social History Narrative  . Not on file   Additional Social History: pt was living in a group home prior to hospitalization.  He has a cousin, Beckie Salts, that pt states is his ?HCPOA.    Sleep: poor  Appetite:  Fair  Current Medications: Current Facility-Administered Medications  Medication Dose Route Frequency Provider Last Rate Last Dose  . acetaminophen (TYLENOL) tablet 650 mg  650 mg Oral Q6H PRN Clapacs, Jackquline Denmark, MD   650 mg at 05/16/18 0310  . alum & mag hydroxide-simeth (MAALOX/MYLANTA) 200-200-20 MG/5ML suspension 30 mL  30 mL Oral  Q4H PRN Clapacs, John T, MD      . amLODipine (NORVASC) tablet 5 mg  5 mg Oral Daily Clapacs, Jackquline Denmark, MD   5 mg at 05/16/18 1525  . benztropine (COGENTIN) tablet 1 mg  1 mg Oral BID Clapacs, Jackquline Denmark, MD   1 mg at 05/17/18 0912  . clonazePAM (KLONOPIN) tablet 1 mg  1 mg Oral TID Clapacs, Jackquline Denmark, MD   1 mg at 05/17/18 1133  . divalproex (DEPAKOTE) DR tablet 750 mg  750 mg Oral Q8H Pucilowska, Jolanta B, MD   750 mg at 05/17/18 0624  . hydrOXYzine (ATARAX/VISTARIL) tablet 50 mg  50 mg Oral Q4H PRN Clapacs, Jackquline Denmark, MD   50 mg at 05/17/18 1133  . ibuprofen (ADVIL,MOTRIN) tablet 400 mg  400 mg Oral Q6H PRN [name], MD   400 mg at 05/17/18 1133  . insulin aspart (novoLOG) injection 0-15 Units  0-15 Units Subcutaneous TID WC Clapacs, Jackquline Denmark, MD   8 Units at 05/17/18 1134  . insulin detemir (LEVEMIR) injection 28 Units  28 Units Subcutaneous Daily Clapacs, Jackquline Denmark, MD   28 Units at 05/17/18 [phone]  . lactulose (CHRONULAC) 10 GM/15ML solution 10 g  10 g Oral BID Clapacs, Jackquline Denmark, MD   10 g at 05/17/18 0912  . levETIRAcetam (KEPPRA) tablet 1,000 mg  1,000 mg Oral QHS Clapacs, John T, MD   1,000 mg at 05/16/18 2039  . lisinopril (PRINIVIL,ZESTRIL) tablet 20 mg  20 mg Oral Daily Clapacs, Jackquline Denmark, MD   20 mg at 05/17/18 0912  . LORazepam (ATIVAN) tablet 2 mg  2 mg Oral Q4H PRN Pucilowska, Jolanta B, MD   2 mg at 05/16/18 2312   Or  . LORazepam (ATIVAN) injection 2 mg  2 mg Intramuscular Q4H PRN Pucilowska, Jolanta B, MD      . magnesium hydroxide (MILK OF MAGNESIA) suspension 30 mL  30 mL Oral Daily PRN Clapacs, John T, MD      . OLANZapine (ZYPREXA) tablet 15 mg  15 mg Oral TID PRN Pucilowska, Jolanta B, MD   15 mg at 05/16/18 2039   Or  . OLANZapine (ZYPREXA) injection 10 mg  10 mg Intramuscular TID PRN Pucilowska, Jolanta B, MD      . pantoprazole (PROTONIX) EC tablet 40 mg  40 mg Oral Daily Clapacs, Jackquline Denmark, MD   40 mg at 05/17/18 0912  . perphenazine (TRILAFON) tablet 8 mg  8 mg Oral TID Clapacs, Jackquline Denmark,  MD   8 mg at 05/17/18 1133  . traZODone (DESYREL) tablet 150 mg  150 mg Oral QHS Clapacs, Jackquline Denmark, MD   150 mg at 05/16/18 2039    Lab Results:  Results for orders placed or performed during the hospital encounter of 05/14/18 (from the past  48 hour(s))  Glucose, capillary     Status: Abnormal   Collection Time: 05/15/18 12:25 PM  Result Value Ref Range   Glucose-Capillary 156 (H) 70 - 99 mg/dL  Glucose, capillary     Status: Abnormal   Collection Time: 05/15/18  4:15 PM  Result Value Ref Range   Glucose-Capillary 117 (H) 70 - 99 mg/dL  Glucose, capillary     Status: Abnormal   Collection Time: 05/15/18  9:06 PM  Result Value Ref Range   Glucose-Capillary 127 (H) 70 - 99 mg/dL  Glucose, capillary     Status: Abnormal   Collection Time: 05/16/18  7:12 AM  Result Value Ref Range   Glucose-Capillary 158 (H) 70 - 99 mg/dL  Glucose, capillary     Status: Abnormal   Collection Time: 05/16/18  3:20 PM  Result Value Ref Range   Glucose-Capillary 190 (H) 70 - 99 mg/dL  Glucose, capillary     Status: Abnormal   Collection Time: 05/16/18  8:38 PM  Result Value Ref Range   Glucose-Capillary 131 (H) 70 - 99 mg/dL  Glucose, capillary     Status: Abnormal   Collection Time: 05/17/18  6:45 AM  Result Value Ref Range   Glucose-Capillary 191 (H) 70 - 99 mg/dL   Comment 1 Notify RN   Glucose, capillary     Status: Abnormal   Collection Time: 05/17/18 11:30 AM  Result Value Ref Range   Glucose-Capillary 281 (H) 70 - 99 mg/dL   Comment 1 Notify RN     Blood Alcohol level:  Lab Results  Component Value Date   ETH <10 05/13/2018   ETH <5 04/13/2017    Metabolic Disorder Labs: Lab Results  Component Value Date   HGBA1C 5.6 05/15/2018   MPG 114.02 05/15/2018   No results found for: PROLACTIN Lab Results  Component Value Date   CHOL 93 05/15/2018   TRIG 61 05/15/2018   HDL 45 05/15/2018   CHOLHDL 2.1 05/15/2018   VLDL 12 05/15/2018   LDLCALC 36 05/15/2018    Physical  Findings: AIMS:  , ,  ,  ,    CIWA:    COWS:     Musculoskeletal: Musculoskeletal: Strength & Muscle Tone:within normal limits Gait & Station:normal Patient leans:N/A  Psychiatric Specialty Exam: Physical Exam  Nursing note and vitals reviewed. Constitutional:  Tall, misshapen head with missing/thin patches of hair    Review of Systems  Reason unable to perform ROS: pt is asleep and only arousable briefly.  Skin:       Skin discoloration on left lower leg (pt state it's due to taking "januvia")     Blood pressure (!) 146/75, pulse (!) 123, temperature 99.1 F (37.3 C), temperature source Oral, resp. rate 18, height 6\' 4"  (1.93 m), weight 110.7 kg (244 lb), SpO2 95 %.Body mass index is 29.7 kg/m.  General Appearance: Disheveled, very intrusive  Eye Contact:  None  Speech:  Garbled   Volume:  Normal  Mood:  anxious  Affect:  labile  Thought Process:  Unable to assess today  Orientation:  Full (Time, Place, and Person)  Thought Content:  disorganized  Suicidal Thoughts:  none voiced  Homicidal Thoughts:  none voiced  Memory:  Unable to assess today  Judgement:  Poor  Insight:  Lacking  Psychomotor Activity:  Unable to assess today as pt is asleep most of the day  Concentration:  Concentration: Unable to assess today  Recall:  NA  Fund of Knowledge:  NA  Language:  Fair  Akathisia:  No  AIMS (if indicated):   0  Assets:  Others:  has established outpt mental health care  ADL's: pt did not get up to tend to ADL's today;   Cognition:  Impaired,  Mild  Sleep:  Number of Hours: 4.25     Treatment Plan Summary: Treatment Plan Summary:   HTN Diabetes  Seizure disorder Constipation GERD  Daily contact with patient to assess and evaluate symptoms and progress in treatment and Medication management . Schizoaffective, bipolar type, pt labile, disorganized, agitates at times, s/p fall.  Plan: Schizoaffective disorder, bipolar type -perphenazine 8 mg  TID -depakote 750 mg TID, depakote level- 60 -klonopin 1 mg TID -cogentin 1 mg BID for EPS prophylaxis -increase trazodone for sleep Monitor orthostatic BP.  Observation Level/Precautions:  15 minute checks, seizure precautions  Laboratory:  CBC-reviewed Chemistry Profile-reviewed HbAIC-5.6 UDS-Positive for benzos lipid panel-Total Cholesterol 93, HDL 45, LDL 36, Trig 61 TSH-2.117 Valproic Acid level 60 on 05/14/18 (prior to increase of depakote to current dose) EKG-QTc 447  Psychotherapy:  Group therapies when appropriate  Medications:  See above  Consultations:  N/A  Discharge: group home    Estimated LOS: 5-7 days; tentative discharge-late next week        Beverly Sessions, MD 05/17/2018, 11:45 AMPatient ID: Oscar Duke, male   DOB: December 07, 1970, 47 y.o.   MRN: [id number]

## 2018-05-18 LAB — GLUCOSE, CAPILLARY
Glucose-Capillary: 130 mg/dL — ABNORMAL HIGH (ref 70–99)
Glucose-Capillary: 216 mg/dL — ABNORMAL HIGH (ref 70–99)
Glucose-Capillary: 109 mg/dL — ABNORMAL HIGH (ref 70–99)
Glucose-Capillary: 126 mg/dL — ABNORMAL HIGH (ref 70–99)

## 2018-05-18 MED ORDER — TRAZODONE HCL 100 MG PO TABS
300.0000 mg | ORAL_TABLET | Freq: Every day | ORAL | Status: DC
  Administered 2018-05-18 – 2018-05-19 (×2): 300 mg via ORAL
  Filled 2018-05-18 (×2): qty 3

## 2018-05-18 NOTE — Progress Notes (Signed)
D- Patient alert and oriented. Patient presents in a pleasant mood on assessment, however, patient is extremely tangential. Patient is preoccupied with his flight of ideas and can not focus on answering certain questions for this Clinical research associatewriter. He answers a few questions and then goes off on a tangent. Patient reports on his self-inventory that his anxiety is a "5/10", but did not voice to this writer why he's feeling this way, but also reports that he is "hyperactive". Patient denies SI, HI, AVH, at this time. Patient has no stated/reported goals for today.  A- Scheduled medications administered to patient, per MD orders. Support and encouragement provided.  Routine safety checks conducted every 15 minutes.  Patient informed to notify staff with problems or concerns.  R- No adverse drug reactions noted. Patient contracts for safety at this time. Patient compliant with medications and treatment plan. Patient receptive, calm, and cooperative. Patient interacts well with others on the unit.  Patient remains safe at this time.

## 2018-05-18 NOTE — Progress Notes (Signed)
CH was doing routine rounds, was asked by the patient to retrieve for him a Bible and wanted to talk. Accompanied the patient to his room, pastoral presence ensued, offered words of comfort, prayer offered and accepted, prayer ensued. A Bible was left in the room.

## 2018-05-18 NOTE — [facility] Group Notes (Signed)
LCSW Group Therapy Notes 05/18/2018 1:00pm Type of Therapy and Topic:  Group Therapy:  Communication Participation Level:  Active  Description of Group: Patients will identify how individuals communicate with one another appropriately and inappropriately.  Patients will be guided to discuss their thoughts, feelings and behaviors related to barriers when communicating.  The group will process together ways to execute positive and appropriate communication with attention given to how one uses behavior, tone and body language.  Patients will be encouraged to reflect on a situation where they were successfully able to communicate and what made this example successful.  Group will identify specific changes they are motivated to make in order to overcome communication barriers with self, peers, authority, and parents.  This group will be process-oriented with patients participating in exploration of their own experiences, giving and receiving support, and challenging self and other group members.   Therapeutic Goals 1. Patient will identify how people communicate (body language, facial expression, and electronics).  Group will also discuss tone, voice and how these impact what is communicated and what is received. 2. Patient will identify feelings (such as fear or worry), thought process and behaviors related to why people internalize feelings rather than express self openly. 3. Patient will identify two changes they are willing to make to overcome communication barriers 4. Members will then practice through role play how to communicate using I statements, I feel statements, and acknowledging feelings rather than displacing feelings on others Summary of Patient Progress: Pt attended group and appeared to be making a real effort to not interrupt others.  He did still require significant redirection as he was tangential and off topic frequently.  Therapeutic Modalities Cognitive Behavioral  Therapy Motivational Interviewing Solution Focused Therapy  Glennon MacSara P Emmanuella Mirante, LCSW 05/18/2018 2:00 PM

## 2018-05-18 NOTE — Progress Notes (Signed)
Patient did not receive his meal coverage because his CBG is 109. MD was notified.

## 2018-05-18 NOTE — Progress Notes (Signed)
[facility] MD Progress Note  05/18/2018 2:57 PM Oscar Duke  MRN:  [id number] Subjective:    Pt slept 5.30 hrs last night with 200mg  trazodone, will increase it today. Pt continue to be intrusive, disorganized, tangential speech, wanting to be released,  verbally aggressive with peer and staff at times , pt  requires nurses frequent attention.  Pt wandering on hallway, very intrusive, hyperbal, disorganized, reportedly sexually inappropriate. Reporting constipation, , on lactulose but took only half of the dose given,  Denies N/V, denies belly pain.  Principal Problem: Schizoaffective disorder, bipolar type (HCC) Diagnosis:   Patient Active Problem List   Diagnosis Date Noted  . Schizoaffective disorder, bipolar type (HCC) [F25.0] 05/14/2018  . Somnolence [R40.0] 10/05/2017  . Adverse drug interaction with prescription medication [T50.905A] 10/05/2017  . Hypertension [I10]   . Diabetes mellitus without complication (HCC) [E11.9]   . Bipolar 1 disorder (HCC) [F31.9]   . Seizures (HCC) [R56.9]   . Schizophrenia, acute (HCC) [F23] 05/14/2017   Total Time spent with patient:25 min Past Psychiatric History: Multiple previous psych hospitalizations-[facility], [facility] [place] [facility].  Pt with longstanding hx of mental illness (schizophrenia vs schizoaffective, bipolar type).    Past Medical History:  Past Medical History:  Diagnosis Date  . Bipolar 1 disorder (HCC)   . Diabetes mellitus without complication (HCC)   . Hyperlipidemia   . Hypertension   . 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 Schizophrenia, acute (HCC)   . Seizures (HCC)     Past Surgical History:  Procedure Laterality Date  . JOINT REPLACEMENT     Family History: History reviewed. No pertinent family history. Family Psychiatric  History: unknown Social History:  Social History   Substance and Sexual Activity  Alcohol Use No     Social History   Substance and Sexual Activity  Drug Use Yes  .  Types: Marijuana   Comment: quit 5 years ago    Social History   Socioeconomic History  . Marital status: Single    Spouse name: Not on file  . Number of children: Not on file  . Years of education: Not on file  . Highest education level: Not on file  Occupational History  . Not on file  Social Needs  . Financial resource strain: Not on file  . Food insecurity:    Worry: Not on file    Inability: Not on file  . Transportation needs:    Medical: Not on file    Non-medical: Not on file  Tobacco Use  . Smoking status: Never Smoker  . Smokeless tobacco: Never Used  Substance and Sexual Activity  . Alcohol use: No  . Drug use: Yes    Types: Marijuana    Comment: quit 5 years ago  . Sexual activity: Not on file  Lifestyle  . Physical activity:    Days per week: Not on file    Minutes per session: Not on file  . Stress: Not on file  Relationships  . Social connections:    Talks on phone: Not on file    Gets together: Not on file    Attends religious service: Not on file    Active member of club or organization: Not on file    Attends meetings of clubs or organizations: Not on file    Relationship status: Not on file  Other Topics Concern  . Not on file  Social History Narrative  . Not on file   Additional Social History:  pt was living in a group home prior to hospitalization.  He has a cousin, Beckie Salts, that pt states is his ?HCPOA.    Sleep: poor  Appetite:  Fair  Current Medications: Current Facility-Administered Medications  Medication Dose Route Frequency Provider Last Rate Last Dose  . acetaminophen (TYLENOL) tablet 650 mg  650 mg Oral Q6H PRN Clapacs, Jackquline Denmark, MD   650 mg at 05/16/18 0310  . alum & mag hydroxide-simeth (MAALOX/MYLANTA) 200-200-20 MG/5ML suspension 30 mL  30 mL Oral Q4H PRN Clapacs, John T, MD      . amLODipine (NORVASC) tablet 5 mg  5 mg Oral Daily Clapacs, John T, MD   5 mg at 05/18/18 0800  . benztropine (COGENTIN) tablet 1 mg   1 mg Oral BID Clapacs, John T, MD   1 mg at 05/18/18 0800  . clonazePAM (KLONOPIN) tablet 1 mg  1 mg Oral TID Clapacs, Jackquline Denmark, MD   1 mg at 05/18/18 1228  . divalproex (DEPAKOTE) DR tablet 750 mg  750 mg Oral Q8H Pucilowska, Jolanta B, MD   750 mg at 05/18/18 1354  . hydrOXYzine (ATARAX/VISTARIL) tablet 50 mg  50 mg Oral Q4H PRN Clapacs, Jackquline Denmark, MD   50 mg at 05/17/18 1133  . ibuprofen (ADVIL,MOTRIN) tablet 400 mg  400 mg Oral Q6H PRN [name], MD   400 mg at 05/18/18 0855  . insulin aspart (novoLOG) injection 0-15 Units  0-15 Units Subcutaneous TID WC Clapacs, Jackquline Denmark, MD   5 Units at 05/18/18 1228  . insulin detemir (LEVEMIR) injection 28 Units  28 Units Subcutaneous Daily Clapacs, Jackquline Denmark, MD   28 Units at 05/18/18 0800  . lactulose (CHRONULAC) 10 GM/15ML solution 10 g  10 g Oral BID Clapacs, Jackquline Denmark, MD   10 g at 05/18/18 0759  . levETIRAcetam (KEPPRA) tablet 1,000 mg  1,000 mg Oral QHS Clapacs, John T, MD   1,000 mg at 05/17/18 2133  . lisinopril (PRINIVIL,ZESTRIL) tablet 20 mg  20 mg Oral Daily Clapacs, John T, MD   20 mg at 05/18/18 0800  . LORazepam (ATIVAN) tablet 2 mg  2 mg Oral Q4H PRN Pucilowska, Jolanta B, MD   2 mg at 05/16/18 2312   Or  . LORazepam (ATIVAN) injection 2 mg  2 mg Intramuscular Q4H PRN Pucilowska, Jolanta B, MD      . magnesium hydroxide (MILK OF MAGNESIA) suspension 30 mL  30 mL Oral Daily PRN Clapacs, John T, MD      . OLANZapine (ZYPREXA) tablet 15 mg  15 mg Oral TID PRN Pucilowska, Jolanta B, MD   15 mg at 05/16/18 2039   Or  . OLANZapine (ZYPREXA) injection 10 mg  10 mg Intramuscular TID PRN Pucilowska, Jolanta B, MD      . pantoprazole (PROTONIX) EC tablet 40 mg  40 mg Oral Daily Clapacs, John T, MD   40 mg at 05/18/18 0800  . perphenazine (TRILAFON) tablet 8 mg  8 mg Oral TID Clapacs, Jackquline Denmark, MD   8 mg at 05/18/18 1228  . traZODone (DESYREL) tablet 300 mg  300 mg Oral QHS Beverly Sessions, MD        Lab Results:  Results for orders placed or performed  during the hospital encounter of 05/14/18 (from the past 48 hour(s))  Glucose, capillary     Status: Abnormal   Collection Time: 05/16/18  3:20 PM  Result Value Ref Range   Glucose-Capillary 190 (H) 70 - 99  mg/dL  Glucose, capillary     Status: Abnormal   Collection Time: 05/16/18  8:38 PM  Result Value Ref Range   Glucose-Capillary 131 (H) 70 - 99 mg/dL  Glucose, capillary     Status: Abnormal   Collection Time: 05/17/18  6:45 AM  Result Value Ref Range   Glucose-Capillary 191 (H) 70 - 99 mg/dL   Comment 1 Notify RN   Glucose, capillary     Status: Abnormal   Collection Time: 05/17/18 11:30 AM  Result Value Ref Range   Glucose-Capillary 281 (H) 70 - 99 mg/dL   Comment 1 Notify RN   Glucose, capillary     Status: Abnormal   Collection Time: 05/17/18  4:20 PM  Result Value Ref Range   Glucose-Capillary 149 (H) 70 - 99 mg/dL  Glucose, capillary     Status: Abnormal   Collection Time: 05/17/18  8:08 PM  Result Value Ref Range   Glucose-Capillary 178 (H) 70 - 99 mg/dL   Comment 1 Notify RN   Glucose, capillary     Status: Abnormal   Collection Time: 05/18/18  6:58 AM  Result Value Ref Range   Glucose-Capillary 130 (H) 70 - 99 mg/dL  Glucose, capillary     Status: Abnormal   Collection Time: 05/18/18 11:28 AM  Result Value Ref Range   Glucose-Capillary 216 (H) 70 - 99 mg/dL   Comment 1 Notify RN     Blood Alcohol level:  Lab Results  Component Value Date   ETH <10 05/13/2018   ETH <5 04/13/2017    Metabolic Disorder Labs: Lab Results  Component Value Date   HGBA1C 5.6 05/15/2018   MPG 114.02 05/15/2018   No results found for: PROLACTIN Lab Results  Component Value Date   CHOL 93 05/15/2018   TRIG 61 05/15/2018   HDL 45 05/15/2018   CHOLHDL 2.1 05/15/2018   VLDL 12 05/15/2018   LDLCALC 36 05/15/2018    Physical Findings: AIMS:  , ,  ,  ,    CIWA:    COWS:     Musculoskeletal: Musculoskeletal: Strength & Muscle Tone:within normal limits Gait &  Station:normal Patient leans:N/A  Psychiatric Specialty Exam: Physical Exam  Nursing note and vitals reviewed. Constitutional:  Tall, misshapen head with missing/thin patches of hair    Review of Systems  Reason unable to perform ROS: pt is asleep and only arousable briefly.  Skin:       Skin discoloration on left lower leg (pt state it's due to taking "januvia")     Blood pressure 113/83, pulse (!) 105, temperature (!) 97.5 F (36.4 C), temperature source Oral, resp. rate 18, height 6\' 4"  (1.93 m), weight 110.7 kg (244 lb), SpO2 99 %.Body mass index is 29.7 kg/m.  General Appearance: Disheveled, very intrusive  Eye Contact:  None  Speech:  disorganized  Volume:  Normal  Mood:  anxious  Affect:  labile  Thought Process:  Unable to assess today  Orientation:  Full (Time, Place, and Person)  Thought Content:  tangential  Suicidal Thoughts:  none voiced  Homicidal Thoughts:  none voiced  Memory:  Unable to assess today  Judgement:  Poor  Insight:  Lacking  Psychomotor Activity:  Unable to assess today as pt is asleep most of the day  Concentration:  Concentration: Unable to assess today  Recall:  NA  Fund of Knowledge:  NA  Language:  Fair  Akathisia:  No  AIMS (if indicated):   0  Assets:  Others:  has established outpt mental health care  ADL's: pt did not get up to tend to ADL's today;   Cognition:  Impaired,  Mild  Sleep:  Number of Hours: 5.3     Treatment Plan Summary: Treatment Plan Summary:   HTN Diabetes  Seizure disorder Constipation GERD  Daily contact with patient to assess and evaluate symptoms and progress in treatment and Medication management . Schizoaffective, bipolar type, pt labile, disorganized, agitates at times, will increase trazodone for sleep.   Plan: Schizoaffective disorder, bipolar type -perphenazine 8 mg TID -depakote 750 mg TID, depakote level- 60 -klonopin 1 mg TID -cogentin 1 mg BID for EPS prophylaxis -increase  trazodone for sleep  Observation Level/Precautions:  15 minute checks, seizure precautions  Laboratory:  CBC-reviewed Chemistry Profile-reviewed HbAIC-5.6 UDS-Positive for benzos lipid panel-Total Cholesterol 93, HDL 45, LDL 36, Trig 61 TSH-2.117 Valproic Acid level 60 on 05/14/18 (prior to increase of depakote to current dose) EKG-QTc 447  Psychotherapy:  Group therapies when appropriate  Medications:  See above  Consultations:  N/A  Discharge: group home    Estimated LOS: 5-7 days; tentative discharge-late next week        Beverly Sessions, MD 05/18/2018, 2:57 PMPatient ID: Oscar Duke, male   DOB: Apr 01, 1971, 47 y.o.   MRN: [id number] Patient ID: Oscar Duke, male   DOB: June 29, 1971, 47 y.o.   MRN: [id number]

## 2018-05-18 NOTE — Plan of Care (Signed)
Patient has the ability to develop effective coping behaviors as well as the ability to interact with staff and other members on the unit. Patient has been using fair eye contact while communicating with this Clinical research associatewriter. Patient has participated in the decision-making process regarding his care. Patient has verbalized understanding of some of the information that has been provided to him and is preoccupied with flight of ideas about his family members and his medications. Patient denies any depression/anxiety, however, he reports on his self-inventory that he rates his anxiety level a "5/10", but does not state to this writer why he reported feeling anxious. Patient has been free from injury on the unit thus far and remains safe at this time.   Problem: Coping: Goal: Ability to identify and develop effective coping behavior will improve Outcome: Progressing Goal: Ability to interact with others will improve Outcome: Progressing Goal: Demonstration of participation in decision-making regarding own care will improve Outcome: Progressing Goal: Ability to use eye contact when communicating with others will improve Outcome: Progressing   Problem: Self-Concept: Goal: Will verbalize positive feelings about self Outcome: Progressing   Problem: Education: Goal: Knowledge of [facility] Education information/materials will improve Outcome: Progressing Goal: Emotional status will improve Outcome: Progressing Goal: Mental status will improve Outcome: Progressing Goal: Verbalization of understanding the information provided will improve Outcome: Progressing   Problem: Safety: Goal: Periods of time without injury will increase Outcome: Progressing   Problem: Education: Goal: Knowledge of General Education information will improve Outcome: Progressing

## 2018-05-18 NOTE — Plan of Care (Signed)
No falls this evening. Assessed patient for post fall ,patient is encouraged to use his walker provided to him, but patient is non compliant with his walker, states "I don't need this Shiite"  patient is encouraged and redirected for safety, compliant with his medicines and monitored   for safety of self and others, patient will not admit or deny SI/HI, patient is constantly redirected by staffs and 15 minute rounding is maintained ,no distress at this time. Problem: Coping: Goal: Ability to identify and develop effective coping behavior will improve Outcome: Not Progressing Goal: Ability to interact with others will improve Outcome: Not Progressing Goal: Demonstration of participation in decision-making regarding own care will improve Outcome: Not Progressing Goal: Ability to use eye contact when communicating with others will improve Outcome: Not Progressing   Problem: Self-Concept: Goal: Will verbalize positive feelings about self Outcome: Not Progressing   Problem: Education: Goal: Knowledge of [facility] Education information/materials will improve Outcome: Not Progressing Goal: Emotional status will improve Outcome: Not Progressing Goal: Mental status will improve Outcome: Not Progressing Goal: Verbalization of understanding the information provided will improve Outcome: Not Progressing   Problem: Safety: Goal: Periods of time without injury will increase Outcome: Not Progressing   Problem: Education: Goal: Knowledge of General Education information will improve Outcome: Not Progressing

## 2018-05-18 NOTE — Progress Notes (Signed)
Patient ID: Oscar Duke, male   DOB: 1971/04/26, 47 y.o.   MRN: [id number] Per State regulations 482.30 this chart was reviewed for medical necessity with respect to the patient's admission/duration of stay.    Next review date: 05/22/18  Thurman CoyerEric Deshon Koslowski, BSN, RN-BC  Case Manager

## 2018-05-19 DIAGNOSIS — I10 Essential (primary) hypertension: Secondary | ICD-10-CM

## 2018-05-19 DIAGNOSIS — R569 Unspecified convulsions: Secondary | ICD-10-CM

## 2018-05-19 DIAGNOSIS — K5900 Constipation, unspecified: Secondary | ICD-10-CM

## 2018-05-19 DIAGNOSIS — K219 Gastro-esophageal reflux disease without esophagitis: Secondary | ICD-10-CM

## 2018-05-19 DIAGNOSIS — E119 Type 2 diabetes mellitus without complications: Secondary | ICD-10-CM

## 2018-05-19 DIAGNOSIS — F250 Schizoaffective disorder, bipolar type: Secondary | ICD-10-CM

## 2018-05-19 LAB — GLUCOSE, CAPILLARY
GLUCOSE-CAPILLARY: 134 mg/dL — AB (ref 70–99)
Glucose-Capillary: 212 mg/dL — ABNORMAL HIGH (ref 70–99)
Glucose-Capillary: 132 mg/dL — ABNORMAL HIGH (ref 70–99)
Glucose-Capillary: 103 mg/dL — ABNORMAL HIGH (ref 70–99)

## 2018-05-19 MED ORDER — BACITRACIN-NEOMYCIN-POLYMYXIN OINTMENT TUBE
TOPICAL_OINTMENT | Freq: Two times a day (BID) | CUTANEOUS | Status: DC
Start: 2018-05-19 — End: 2018-05-28
  Administered 2018-05-19 – 2018-05-21 (×4): 1 via TOPICAL
  Administered 2018-05-21 – 2018-05-25 (×5): via TOPICAL
  Administered 2018-05-28: 1 via TOPICAL
  Filled 2018-05-19: qty 14.17

## 2018-05-19 MED ORDER — CLONAZEPAM 1 MG PO TABS
1.0000 mg | ORAL_TABLET | Freq: Three times a day (TID) | ORAL | Status: DC
  Administered 2018-05-19 – 2018-05-20 (×3): 1 mg via ORAL
  Filled 2018-05-19 (×3): qty 1

## 2018-05-19 NOTE — Progress Notes (Signed)
Patient ID: Oscar Duke, male   DOB: 11-24-1970, 47 y.o.   MRN: [id number] CSW spoke with Pt's group home [name] who said that she was aware that the Pt may have had some medication changes and was not at his best when she had him for what she says was less than 24hrs.  So she says she is willing to consider him coming back once he has been stabilized. She wants to be able to meet with him to assess prior to discharge though.  CSW agreed to keep in touch and schedule a time for her to come once we are closer to discharge.  Jake SharkSara Jaelani Posa, LCSW

## 2018-05-19 NOTE — Progress Notes (Signed)
Recreation Therapy Notes  INPATIENT RECREATION THERAPY ASSESSMENT  Patient Details Name: Oscar Duke MRN: [id number] DOB: 1971/01/14 Today's Date: 05/19/2018   Patient unable to complete assessment at this time.       Information Obtained From:    Able to Participate in Assessment/Interview:    Patient Presentation:    Reason for Admission (Per Patient):    Patient Stressors:    Coping Skills:      Leisure Interests (2+):     Frequency of Recreation/Participation:    Awareness of Community Resources:     [employer]Community Resources:     Current Use:    If no, Barriers?:    Expressed Interest in State Street CorporationCommunity Resource Information:    [place]     Patient Main Form of Transportation:    Patient Strengths:     Patient Identified Areas of Improvement:     Patient Goal for Hospitalization:     Current SI (including self-harm):     Current HI:     Current AVH:    Staff Intervention Plan:    Consent to Intern Participation:    Brycelynn Stampley 05/19/2018, 1:40 PM

## 2018-05-19 NOTE — Progress Notes (Signed)
Patient ID: Oscar Duke, male   DOB: [birth year], 47 y.o.   MRN: [id number] CSW spoke with Pt's ACTT team lead Judeth CornfieldStephanie with [facility] who says they have not been notified that the Pt would not be able to return.  She offers insight into Pt behaviors saying that he had been doing ok at group home for about 6 months prior to his fellow resident and friend moving.  This, she believes, contributed to his increase in symptoms and him wanting to move to current group home where he was only there for 2-3 days prior to being brought to the ER.  CSW agreed to follow up with her to notify of discharge plans and any barriers to placement.  Jake SharkSara Aloysuis Ribaudo, LCSW

## 2018-05-19 NOTE — Progress Notes (Signed)
D:Patient out side playing basketball  Went up for a lay up, came down  Out of his shoe went on dow  To payment  Rolled over onto his  Elbow scraping  It  MD aware of  Incident. Family member notified. A:Instructed  Not to play basketball  R: Upset with Clinical research associatewriter, Research officer, political partycalled writer a dumb ass

## 2018-05-19 NOTE — Progress Notes (Signed)
D: Patient stated slept good last night .Stated appetite is good and energy level  Is normal. Stated concentration is good . Stated on Depression scale 0, hopeless 0 and anxiety 0 .( low 0-10 high) Denies suicidal  homicidal ideations  .  No auditory hallucinations  No pain concerns . Appropriate ADL'S. Interacting with peers and staff. Emotional and mental status  improved . Unable to cognitively  understand parts of information received . Voice of no safety concerns  working on coping skills  Limited interactions with peers . Able to maintain eye contact. Goal today stay positive Pacing halls this shift . Repeating  His self to nursing staff. Making phone calls  repeatly   A: Encourage patient participation with unit programming . Instruction  Given on  Medication , verbalize understanding.  R: Voice no other concerns. Staff continue to monitor .

## 2018-05-19 NOTE — Plan of Care (Signed)
Patient is showing some improvement in all areas, complying with his medicines, communication with peers is less aggressive and non confrontational , patient is safe in the unit and maintaining Good ADLs, no distress monitored every 15 minutes foe safety denies any SI/HI.  Problem: Coping: Goal: Ability to identify and develop effective coping behavior will improve Outcome: Progressing Goal: Ability to interact with others will improve Outcome: Progressing Goal: Demonstration of participation in decision-making regarding own care will improve Outcome: Progressing Goal: Ability to use eye contact when communicating with others will improve Outcome: Progressing   Problem: Self-Concept: Goal: Will verbalize positive feelings about self Outcome: Progressing   Problem: Education: Goal: Knowledge of [facility] Education information/materials will improve Outcome: Progressing Goal: Emotional status will improve Outcome: Progressing Goal: Mental status will improve Outcome: Progressing Goal: Verbalization of understanding the information provided will improve Outcome: Progressing   Problem: Safety: Goal: Periods of time without injury will increase Outcome: Progressing   Problem: Education: Goal: Knowledge of General Education information will improve Outcome: Progressing
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
## 2018-05-19 NOTE — BHH Group Notes (Signed)
LCSW Group Therapy Note   05/19/2018 1:00pm   Type of Therapy and Topic:  Group Therapy:  Overcoming Obstacles   Participation Level:  Did Not Attend   Description of Group:    In this group patients will be encouraged to explore what they see as obstacles to their own wellness and recovery. They will be guided to discuss their thoughts, feelings, and behaviors related to these obstacles. The group will process together ways to cope with barriers, with attention given to specific choices patients can make. Each patient will be challenged to identify changes they are motivated to make in order to overcome their obstacles. This group will be process-oriented, with patients participating in exploration of their own experiences as well as giving and receiving support and challenge from other group members.   Therapeutic Goals: 1. Patient will identify personal and current obstacles as they relate to admission. 2. Patient will identify barriers that currently interfere with their wellness or overcoming obstacles.  3. Patient will identify feelings, thought process and behaviors related to these barriers. 4. Patient will identify two changes they are willing to make to overcome these obstacles:      Summary of Patient Progress      Therapeutic Modalities:   Cognitive Behavioral Therapy Solution Focused Therapy Motivational Interviewing Relapse Prevention Therapy  Glennon MacSara P Wlliam Grosso, LCSW 05/19/2018 3:59 PM

## 2018-05-19 NOTE — Plan of Care (Signed)
Emotional and mental status  improved . Unable to cognitively  understand parts of information received . Voice of no safety concerns  working on coping skills  Limited interactions with peers . Able to maintain eye contact.  Coping: Goal: Ability to identify and develop effective coping behavior will improve 05/19/2018 1251 by Crist InfanteFarrish, Miamarie Moll A, RN Outcome: Progressing 05/19/2018 1233 by Crist InfanteFarrish, Woodruff Skirvin A, RN Outcome: Progressing Goal: Ability to interact with others will improve 05/19/2018 1251 by Crist InfanteFarrish, Xeng Kucher A, RN Outcome: Progressing 05/19/2018 1233 by Crist InfanteFarrish, Calem Cocozza A, RN Outcome: Progressing Goal: Demonstration of participation in decision-making regarding own care will improve 05/19/2018 1251 by Crist InfanteFarrish, Digby Groeneveld A, RN Outcome: Progressing 05/19/2018 1233 by Crist InfanteFarrish, Valrie Jia A, RN Outcome: Not Progressing Goal: Ability to use eye contact when communicating with others will improve 05/19/2018 1251 by Crist InfanteFarrish, Jadalynn Burr A, RN Outcome: Progressing 05/19/2018 1233 by Crist InfanteFarrish, Fraidy Mccarrick A, RN Outcome: Progressing   Problem: Self-Concept: Goal: Will verbalize positive feelings about self 05/19/2018 1251 by Crist InfanteFarrish, Josey Dettmann A, RN Outcome: Progressing 05/19/2018 1233 by Crist InfanteFarrish, Tequlia Gonsalves A, RN Outcome: Progressing   Problem: Education: Goal: Knowledge of [place] [facility] Education information/materials will improve 05/19/2018 1251 by Crist InfanteFarrish, Ethyl Vila A, RN Outcome: Progressing 05/19/2018 1233 by Crist InfanteFarrish, Serenity Batley A, RN Outcome: Progressing Goal: Emotional status will improve 05/19/2018 1251 by Crist InfanteFarrish, Edilia Ghuman A, RN Outcome: Progressing 05/19/2018 1233 by Crist InfanteFarrish, Meaghen Vecchiarelli A, RN Outcome: Progressing Goal: Mental status will improve 05/19/2018 1251 by Crist InfanteFarrish, Kerby Hockley A, RN Outcome: Progressing 05/19/2018 1233 by Crist InfanteFarrish, Adrean Heitz A, RN Outcome: Progressing Goal: Verbalization of understanding the information provided will improve Outcome: Progressing   Problem: Safety: Goal: Periods of time without injury will increase 05/19/2018 1251 by  Crist InfanteFarrish, Maricsa Sammons A, RN Outcome: Progressing 05/19/2018 1233 by Crist InfanteFarrish, Casyn Becvar A, RN Outcome: Progressing

## 2018-05-19 NOTE — Progress Notes (Signed)
Recreation Therapy Notes  Date: 05/19/2018  Time: 9:30 am  Location: Craft Room  Behavioral response: Hyperverbal, redirection needed  Intervention Topic: Problem Solving  Discussion/Intervention:  Group content on today was focused on problem solving. The group described what problem solving is. Patients expressed how problems affect them and how they deal with problems. Individuals identified healthy ways to deal with problems. Patients explained what normally happens to them when they do not deal with problems. The group expressed reoccurring problems for them. The group participated in the intervention "Ways to Solve problems" where patients were given a chance to explore different ways to solve problems.  Clinical Observations/Feedback:  Patient came to group and stated he likes to solve problems by himself. Individual took over the group discussion stating he is going home once he gets his check and that his check is a lot of money. When patient was asked to stay on topic and allow others a chance to participate in the group discussion; patient became agitated and began using profanity towards group facilitator. He was asked to leave group because he was not contributing in a positive manner. Patient left group. Tamiki Kuba LRT/CTRS         Harsh Trulock 05/19/2018 10:25 AM

## 2018-05-20 DIAGNOSIS — E119 Type 2 diabetes mellitus without complications: Secondary | ICD-10-CM

## 2018-05-20 DIAGNOSIS — K5900 Constipation, unspecified: Secondary | ICD-10-CM

## 2018-05-20 DIAGNOSIS — K219 Gastro-esophageal reflux disease without esophagitis: Secondary | ICD-10-CM

## 2018-05-20 DIAGNOSIS — I10 Essential (primary) hypertension: Secondary | ICD-10-CM

## 2018-05-20 DIAGNOSIS — R569 Unspecified convulsions: Secondary | ICD-10-CM

## 2018-05-20 DIAGNOSIS — F250 Schizoaffective disorder, bipolar type: Secondary | ICD-10-CM

## 2018-05-20 LAB — GLUCOSE, CAPILLARY
GLUCOSE-CAPILLARY: 152 mg/dL — AB (ref 70–99)
GLUCOSE-CAPILLARY: 143 mg/dL — AB (ref 70–99)
GLUCOSE-CAPILLARY: 125 mg/dL — AB (ref 70–99)
Glucose-Capillary: 149 mg/dL — ABNORMAL HIGH (ref 70–99)

## 2018-05-20 MED ORDER — CLONAZEPAM 1 MG PO TABS
1.0000 mg | ORAL_TABLET | Freq: Three times a day (TID) | ORAL | Status: DC
  Administered 2018-05-20 – 2018-05-28 (×25): 1 mg via ORAL
  Filled 2018-05-20 (×21): qty 1
  Filled 2018-05-20: qty 2
  Filled 2018-05-20 (×3): qty 1

## 2018-05-20 MED ORDER — TRAZODONE HCL 100 MG PO TABS
100.0000 mg | ORAL_TABLET | Freq: Every day | ORAL | Status: DC
  Administered 2018-05-20 – 2018-05-27 (×8): 100 mg via ORAL
  Filled 2018-05-20 (×8): qty 1

## 2018-05-20 NOTE — Progress Notes (Signed)
D:. Patient stated slept good last night .Stated appetite is good and energy level  Is normal. Stated concentration is good . Stated on Depression scale 0 , hopeless 0 and anxiety 0 .( low 0-10 high) Denies suicidal  homicidal ideations .  Unable to cognitively  understand parts of information received . Voice of no safety concerns  working on coping skills  Limited interactions with peers . Able to maintain eye contact.Emotional and mental status improved.No auditory hallucinations  No pain concerns . Appropriate ADL'S. Interacting with peers and staff.  Labile attitude through out shift. A: Encourage patient participation with unit programming . Instruction  Given on  Medication , verbalize understanding. R: Voice no other concerns. Staff continue to monitor .

## 2018-05-20 NOTE — Plan of Care (Addendum)
Patient found in day room upon my arrival. Patient is visible and social this evening. Attends group. Patient is hyperverbal with pressured, quiet, tangential speech. Patient shows no aggression this evening. Is pleasant and cooperative with no outbursts this evening. Patient continues to be attention-seeking in that anyone who will listen to him, he follows around. Reports improved mood. Affect is more animated. Patient is more in control of himself. Denies SI/HI/AVH. Denies pain. Reports eating and voiding adequately. CBG 125. Compliant with HS medications and staff direction. Given Vistaril for anxiety per request. Positive results. Q 15 minute checks maintained. Will continue to monitor throughout the shift.  Patient slept 7.75 hours. No apparent distress. Will endorse care to oncoming shift.  Problem: Coping: Goal: Ability to interact with others will improve Outcome: Progressing Goal: Ability to use eye contact when communicating with others will improve Outcome: Progressing   Problem: Self-Concept: Goal: Will verbalize positive feelings about self Outcome: Progressing   Problem: Education: Goal: Emotional status will improve Outcome: Progressing Goal: Mental status will improve Outcome: Progressing

## 2018-05-20 NOTE — [facility] Group Notes (Signed)
[facility] Group Notes:  (Nursing/MHT/Case Management/Adjunct)  Date:  05/20/2018  Time:  9:30 PM  Type of Therapy:  Group Therapy  Participation Level:  Active  Participation Quality:  Appropriate  Affect:  Appropriate  Cognitive:  Appropriate  Insight:  Appropriate  Engagement in Group:  Engaged  Modes of Intervention:  Discussion  Summary of Progress/Problems: MHT reviewed rules and expectations of the unit. MHT reminded patients of visiting and phone hours. MHT asked that all patients cover themselves throughout the night because there would be routine checks. MHT informed patients of the call at [facility] for vitals. MHT reminded patients to complete their daily checklist forms. MHT made introduction and modeled how to introduce and inform group of daily goal. MHT reviewed topic from sessions on this day (self-esteem). MHT processed with group about letting go of the past and focusing on a better future. MHT reminded patients they could not change the past, only learn from it. MHT processed with patients about self-forgiveness. MHT encouraged patients to not worry about the things they have no control over and focus on the things they can change. MHT informed patients they could not do the same things and expect a different outcome. MHT encouraged patients to focus on the present and make changes that will move them in a positive direction. Oscar Duke stated his goal was to not get angry on this day. Oscar Duke stated he did not get angry. Oscar Duke commented about his group home not coming to visit him. Oscar Duke stated that upset him, but understood it was not the staff on units fault.  Oscar NeighborsKeith D Merick Duke 05/20/2018, 9:30 PM

## 2018-05-20 NOTE — [facility] Group Notes (Signed)
CSW Group Therapy Note  05/20/2018  Time:  0900  Type of Therapy and Topic: Group Therapy: Goals Group: SMART Goals    Participation Level:  Did Not Attend    Description of Group:   The purpose of a daily goals group is to assist and guide patients in setting recovery/wellness-related goals. The objective is to set goals as they relate to the crisis in which they were admitted. Patients will be using SMART goal modalities to set measurable goals. Characteristics of realistic goals will be discussed and patients will be assisted in setting and processing how one will reach their goal. Facilitator will also assist patients in applying interventions and coping skills learned in psycho-education groups to the SMART goal and process how one will achieve defined goal.    Therapeutic Goals:  -Patients will develop and document one goal related to or their crisis in which brought them into treatment.  -Patients will be guided by LCSW using SMART goal setting modality in how to set a measurable, attainable, realistic and time sensitive goal.  -Patients will process barriers in reaching goal.  -Patients will process interventions in how to overcome and successful in reaching goal.    Patient's Goal:   Pt was invited to attend group but chose not to attend. CSW will continue to encourage pt to attend group throughout their admission.   Therapeutic Modalities:  Motivational Interviewing  Cognitive Behavioral Therapy  Crisis Intervention Model  SMART goals setting  Heidi DachKelsey Esvin Hnat, MSW, LCSW Clinical Social Worker 05/20/2018 9:51 AM

## 2018-05-20 NOTE — Plan of Care (Signed)
Oscar Duke is nonstop once approached; FOI, tangential type of loose associations, he never comes back to address the initial question; also exhibit some Antisocial traits, poor insight to behaviors "they banned me from most of the grocery stores around where I live; I wanted to steal some snacks but I dropped it before the Police walked in, I don't have plastic money and I don't have dollars, so what am I supposed to do when I am hungry..." Ativan 2 mg and Vistaril 50 mg given at MN, "I can't go back to sleep, can you give me something.."  Patient slept for Estimated Hours of 5; Precautionary checks every 15 minutes for safety maintained, room free of safety hazards, patient sustains no injury or falls during this shift.  Problem: Coping: Goal: Ability to interact with others will improve Outcome: Progressing Goal: Ability to use eye contact when communicating with others will improve Outcome: Progressing   Problem: Self-Concept: Goal: Will verbalize positive feelings about self Outcome: Progressing   Problem: Safety: Goal: Periods of time without injury will increase Outcome: Progressing   Problem: Education: Goal: Mental status will improve Outcome: Not Progressing

## 2018-05-20 NOTE — Progress Notes (Signed)
Recreation Therapy Notes  Date: 05/20/2018  Time: 9:30 am  Location: Craft Room  Behavioral response: Appropriate, hyperverbal   Intervention Topic: Self-esteem  Discussion/Intervention:  Group content today was focused on self-esteem. Patient defined self-esteem and where it comes form. The group described reasons self-esteem is important. Individuals stated things that impact self-esteem and positive ways to improve self-esteem. The group participated in the intervention "Improving Me" where patients were able to create a collage of positive things that makes them who they are. Clinical Observations/Feedback:  Patient came to group and apologized to group facilitator for his outburst in group the day prior. Individual stated he was ready for group today. He participated in the intervention and was prompted at least 3 times to focus on the topic; by the group facilitator.Individual was very social with group facilitator about his life growing up. Patient needed a lot of encouragement to focus on the task at hand.   Sahira Cataldi LRT/CTRS         Estevan Kersh 05/20/2018 10:50 AM
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
## 2018-05-20 NOTE — Plan of Care (Signed)
Unable to cognitively  understand parts of information received . Voice of no safety concerns  working on coping skills  Limited interactions with peers . Able to maintain eye contact.Emotional and mental status  improved    Problem: Coping: Goal: Ability to identify and develop effective coping behavior will improve Outcome: Progressing Goal: Ability to interact with others will improve Outcome: Progressing Goal: Demonstration of participation in decision-making regarding own care will improve Outcome: Progressing Goal: Ability to use eye contact when communicating with others will improve Outcome: Progressing   Problem: Self-Concept: Goal: Will verbalize positive feelings about self Outcome: Progressing   Problem: Education: Goal: Knowledge of [place] [facility] Education information/materials will improve Outcome: Progressing Goal: Emotional status will improve Outcome: Progressing Goal: Mental status will improve Outcome: Progressing Goal: Verbalization of understanding the information provided will improve Outcome: Progressing   Problem: Education: Goal: Knowledge of General Education information will improve Outcome: Progressing
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
## 2018-05-21 DIAGNOSIS — R569 Unspecified convulsions: Secondary | ICD-10-CM

## 2018-05-21 DIAGNOSIS — E119 Type 2 diabetes mellitus without complications: Secondary | ICD-10-CM

## 2018-05-21 DIAGNOSIS — F250 Schizoaffective disorder, bipolar type: Secondary | ICD-10-CM

## 2018-05-21 DIAGNOSIS — K219 Gastro-esophageal reflux disease without esophagitis: Secondary | ICD-10-CM

## 2018-05-21 DIAGNOSIS — I10 Essential (primary) hypertension: Secondary | ICD-10-CM

## 2018-05-21 DIAGNOSIS — K5900 Constipation, unspecified: Secondary | ICD-10-CM

## 2018-05-21 LAB — HEPATIC FUNCTION PANEL
ALT: 36 U/L (ref 0–44)
AST: 33 U/L (ref 15–41)
Albumin: 3.8 g/dL (ref 3.5–5.0)
Alkaline Phosphatase: 60 U/L (ref 38–126)
BILIRUBIN TOTAL: 0.5 mg/dL (ref 0.3–1.2)
Total Protein: 6.9 g/dL (ref 6.5–8.1)

## 2018-05-21 LAB — GLUCOSE, CAPILLARY
GLUCOSE-CAPILLARY: 130 mg/dL — AB (ref 70–99)
Glucose-Capillary: 145 mg/dL — ABNORMAL HIGH (ref 70–99)
Glucose-Capillary: 162 mg/dL — ABNORMAL HIGH (ref 70–99)
Glucose-Capillary: 139 mg/dL — ABNORMAL HIGH (ref 70–99)

## 2018-05-21 LAB — VALPROIC ACID LEVEL: Valproic Acid Lvl: 112 ug/mL — ABNORMAL HIGH (ref 50.0–100.0)

## 2018-05-21 LAB — AMMONIA: AMMONIA: 57 umol/L — AB (ref 9–35)

## 2018-05-21 MED ORDER — DIVALPROEX SODIUM 500 MG PO DR TAB
500.0000 mg | DELAYED_RELEASE_TABLET | Freq: Two times a day (BID) | ORAL | Status: DC
  Administered 2018-05-22 – 2018-05-28 (×14): 500 mg via ORAL
  Filled 2018-05-21 (×15): qty 1

## 2018-05-21 MED ORDER — DIVALPROEX SODIUM 500 MG PO DR TAB
1000.0000 mg | DELAYED_RELEASE_TABLET | Freq: Every day | ORAL | Status: DC
  Administered 2018-05-21 – 2018-05-27 (×7): 1000 mg via ORAL
  Filled 2018-05-21 (×7): qty 2

## 2018-05-21 MED ORDER — DIVALPROEX SODIUM 250 MG PO DR TAB
250.0000 mg | DELAYED_RELEASE_TABLET | Freq: Once | ORAL | Status: AC
  Administered 2018-05-21: 250 mg via ORAL
  Filled 2018-05-21: qty 1

## 2018-05-21 NOTE — Plan of Care (Signed)
Unable to cognitively  understand parts of information received . Voice of no safety concerns  working on coping skills  Limited interactions with peers . Able to maintain eye contact.Emotional and mental status  improved  Problem: Coping: Goal: Ability to identify and develop effective coping behavior will improve Outcome: Progressing Goal: Ability to interact with others will improve Outcome: Progressing Goal: Demonstration of participation in decision-making regarding own care will improve Outcome: Progressing Goal: Ability to use eye contact when communicating with others will improve Outcome: Progressing  Problem: Self-Concept: Goal: Will verbalize positive feelings about self Outcome: Progressing   Problem: Education: Goal: Knowledge of [place] [facility] Education information/materials will improve Outcome: Progressing Goal: Emotional status will improve Outcome: Progressing Goal: Mental status will improve Outcome: Progressing Goal: Verbalization of understanding the information provided will improve Outcome: Progressing   Problem: Safety: Goal: Periods of time without injury will increase Outcome: Progressing   Problem: Education: Goal: Knowledge of General Education information will improve Outcome: Progressing

## 2018-05-21 NOTE — Progress Notes (Signed)
Recreation Therapy Notes  Date: 05/21/2018  Time: 9:30 am  Location: Craft Room  Behavioral response: Appropriate   Intervention Topic: Coping Skills  Discussion/Intervention:  Group content on today was focused on coping skills. The group defined what coping skills are and when they can be used. Individuals described how they normally cope with things and the coping skills they normally use. Patients expressed why it is important to cope with things and how not coping with things can affect you. The group participated in the intervention "My coping box" and made coping boxes while adding coping skills they could use in the future to the box. Clinical Observations/Feedback:  Patient came to group and was off topic.He was redirected by group facilitator five times to stay on topic and to contribute to group in a positive manner. Individual partially participated in the intervention and was social with staff. Esabella Stockinger LRT/CTRS         Iman Reinertsen 05/21/2018 1:36 PM

## 2018-05-21 NOTE — Progress Notes (Signed)
D: Patient stated slept good last night .Stated appetite is good and energy level  Is normal. Stated concentration is good . Stated on Depression scale , hopeless and anxiety .( low 0-10 high) Denies suicidal  homicidal ideations  .  No auditory hallucinations  No pain concerns . Appropriate ADL'S. Interacting with peers and staff.  Unable to cognitively understand parts of information received . Voice of no safety concerns working on coping skills Limited interactions with peers . Able to maintain eye contact.Emotional and mental status improved Periods lability  Anger and irritation  During  Shift  A: Encourage patient participation with unit programming . Instruction  Given on  Medication , verbalize understanding. R: Voice no other concerns. Staff continue to monitor
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
## 2018-05-21 NOTE — Tx Team (Signed)
Interdisciplinary Treatment and Diagnostic Plan Update  05/21/2018 Time of Session: 11:10 AM Oscar Duke MRN: [id number]  Principal Diagnosis: Schizoaffective disorder, bipolar type (HCC)  Secondary Diagnoses: Principal Problem:   Schizoaffective disorder, bipolar type (HCC)   Current Medications:  Current Facility-Administered Medications  Medication Dose Route Frequency Provider Last Rate Last Dose  . acetaminophen (TYLENOL) tablet 650 mg  650 mg Oral Q6H PRN Clapacs, Jackquline Denmark, MD   650 mg at 05/19/18 2132  . alum & mag hydroxide-simeth (MAALOX/MYLANTA) 200-200-20 MG/5ML suspension 30 mL  30 mL Oral Q4H PRN Clapacs, John T, MD      . amLODipine (NORVASC) tablet 5 mg  5 mg Oral Daily Clapacs, Jackquline Denmark, MD   5 mg at 05/21/18 0753  . benztropine (COGENTIN) tablet 1 mg  1 mg Oral BID Clapacs, Jackquline Denmark, MD   1 mg at 05/21/18 0753  . clonazePAM (KLONOPIN) tablet 1 mg  1 mg Oral TID [name], MD   1 mg at 05/21/18 1439  . divalproex (DEPAKOTE) DR tablet 1,000 mg  1,000 mg Oral QHS [name], MD      . Melene Muller ON 05/22/2018] divalproex (DEPAKOTE) DR tablet 500 mg  500 mg Oral BID [name], MD      . hydrOXYzine (ATARAX/VISTARIL) tablet 50 mg  50 mg Oral Q4H PRN Clapacs, Jackquline Denmark, MD   50 mg at 05/20/18 2123  . ibuprofen (ADVIL,MOTRIN) tablet 400 mg  400 mg Oral Q6H PRN [name], MD   400 mg at 05/18/18 0855  . insulin aspart (novoLOG) injection 0-15 Units  0-15 Units Subcutaneous TID WC Clapacs, Jackquline Denmark, MD   2 Units at 05/21/18 1140  . insulin detemir (LEVEMIR) injection 28 Units  28 Units Subcutaneous Daily Clapacs, Jackquline Denmark, MD   28 Units at 05/21/18 0706  . lactulose (CHRONULAC) 10 GM/15ML solution 10 g  10 g Oral BID Clapacs, Jackquline Denmark, MD   10 g at 05/21/18 0753  . levETIRAcetam (KEPPRA) tablet 1,000 mg  1,000 mg Oral QHS Clapacs, John T, MD   1,000 mg at 05/20/18 2123  . lisinopril (PRINIVIL,ZESTRIL) tablet 20 mg  20 mg Oral Daily Clapacs, Jackquline Denmark, MD   20 mg at 05/21/18 0753  .  LORazepam (ATIVAN) tablet 2 mg  2 mg Oral Q4H PRN Pucilowska, Jolanta B, MD   2 mg at 05/20/18 0006   Or  . LORazepam (ATIVAN) injection 2 mg  2 mg Intramuscular Q4H PRN Pucilowska, Jolanta B, MD      . magnesium hydroxide (MILK OF MAGNESIA) suspension 30 mL  30 mL Oral Daily PRN Clapacs, Jackquline Denmark, MD      . neomycin-bacitracin-polymyxin (NEOSPORIN) ointment   Topical BID [name], MD   1 application at 05/21/18 0100  . OLANZapine (ZYPREXA) tablet 15 mg  15 mg Oral TID PRN Pucilowska, Jolanta B, MD   15 mg at 05/16/18 2039   Or  . OLANZapine (ZYPREXA) injection 10 mg  10 mg Intramuscular TID PRN Pucilowska, Jolanta B, MD      . pantoprazole (PROTONIX) EC tablet 40 mg  40 mg Oral Daily Clapacs, Jackquline Denmark, MD   40 mg at 05/21/18 0753  . perphenazine (TRILAFON) tablet 8 mg  8 mg Oral TID Clapacs, Jackquline Denmark, MD   8 mg at 05/21/18 1141  . traZODone (DESYREL) tablet 100 mg  100 mg Oral QHS [name], MD   100 mg at 05/20/18 2123   PTA Medications: Medications Prior to  Admission  Medication Sig Dispense Refill Last Dose  . amLODipine (NORVASC) 10 MG tablet Take 10 mg by mouth daily.   11/03/2017 at Unknown time  . atorvastatin (LIPITOR) 40 MG tablet Take 40 mg by mouth every evening.   11/02/2017 at Unknown time  . benztropine (COGENTIN) 1 MG tablet Take 1 mg by mouth 2 (two) times daily.    11/03/2017 at Unknown time  . Cholecalciferol (VITAMIN D3) 2000 units TABS Take 2,000 Units by mouth daily.    11/03/2017 at 800a  . clonazePAM (KLONOPIN) 1 MG tablet Take 1 tablet (1 mg total) by mouth 3 (three) times daily as needed for anxiety. (Patient taking differently: Take 1 mg by mouth 3 (three) times daily. )   11/03/2017 at Unknown time  . cyclobenzaprine (FLEXERIL) 10 MG tablet Take 1 tablet (10 mg total) by mouth 3 (three) times daily as needed. 21 tablet 0   . divalproex (DEPAKOTE) 500 MG DR tablet Take 2,000 mg by mouth at bedtime.   11/02/2017 at 2000  . hydrOXYzine (VISTARIL) 50 MG capsule Take  50 mg by mouth every 4 (four) hours as needed.   unknown  . ibuprofen (ADVIL,MOTRIN) 800 MG tablet Take 1 tablet (800 mg total) by mouth every 6 (six) hours as needed. Take with food 21 tablet 0   . insulin detemir (LEVEMIR) 100 UNIT/ML injection Inject 28 Units into the skin daily.   11/03/2017 at Unknown time  . Lactulose 20 GM/30ML SOLN Take 30 mLs by mouth daily.   11/03/2017 at Unknown time  . levETIRAcetam (KEPPRA) 1000 MG tablet Take 1,000 mg by mouth at bedtime.   11/02/2017 at 2000  . lisinopril (PRINIVIL,ZESTRIL) 20 MG tablet Take 20 mg by mouth daily.   11/03/2017 at Unknown time  . omeprazole (PRILOSEC) 20 MG capsule Take 20 mg by mouth daily.   11/03/2017 at Unknown time  . perphenazine (TRILAFON) 8 MG tablet Take 4-8 mg by mouth 3 (three) times daily. 4mg  twice daily and 8mg  tab at bedtime   11/03/2017 at 800a  . propranolol (INDERAL) 10 MG tablet Take 10 mg by mouth 2 (two) times daily.   11/03/2017 at 800a  . sodium chloride 1 g tablet Take 1 g by mouth daily.   11/03/2017 at Unknown time  . traZODone (DESYREL) 150 MG tablet Take 300 mg by mouth at bedtime.    11/02/2017 at Unknown time    Patient Stressors: Financial difficulties Health problems  Patient Strengths: Active sense of humor Motivation for treatment/growth  Treatment Modalities: Medication Management, Group therapy, Case management,  1 to 1 session with clinician, Psychoeducation, Recreational therapy.   Physician Treatment Plan for Primary Diagnosis: Schizoaffective disorder, bipolar type (HCC) Long Term Goal(s): Improvement in symptoms so as ready for discharge Improvement in symptoms so as ready for discharge   Short Term Goals: Ability to verbalize feelings will improve Ability to disclose and discuss suicidal ideas Compliance with prescribed medications will improve Ability to disclose and discuss suicidal ideas Ability to identify and develop effective coping behaviors will improve Compliance with  prescribed medications will improve Ability to identify triggers associated with substance abuse/mental health issues will improve  Medication Management: Evaluate patient's response, side effects, and tolerance of medication regimen.  Therapeutic Interventions: 1 to 1 sessions, Unit Group sessions and Medication administration.  Evaluation of Outcomes: Progressing  Physician Treatment Plan for Secondary Diagnosis: Principal Problem:   Schizoaffective disorder, bipolar type (HCC)  Long Term Goal(s): Improvement in symptoms so as ready  for discharge Improvement in symptoms so as ready for discharge   Short Term Goals: Ability to verbalize feelings will improve Ability to disclose and discuss suicidal ideas Compliance with prescribed medications will improve Ability to disclose and discuss suicidal ideas Ability to identify and develop effective coping behaviors will improve Compliance with prescribed medications will improve Ability to identify triggers associated with substance abuse/mental health issues will improve     Medication Management: Evaluate patient's response, side effects, and tolerance of medication regimen.  Therapeutic Interventions: 1 to 1 sessions, Unit Group sessions and Medication administration.  Evaluation of Outcomes: Progressing   RN Treatment Plan for Primary Diagnosis: Schizoaffective disorder, bipolar type (HCC) Long Term Goal(s): Knowledge of disease and therapeutic regimen to maintain health will improve  Short Term Goals: Ability to demonstrate self-control, Ability to participate in decision making will improve, Ability to identify and develop effective coping behaviors will improve and Compliance with prescribed medications will improve  Medication Management: RN will administer medications as ordered by provider, will assess and evaluate patient's response and provide education to patient for prescribed medication. RN will report any adverse and/or  side effects to prescribing provider.  Therapeutic Interventions: 1 on 1 counseling sessions, Psychoeducation, Medication administration, Evaluate responses to treatment, Monitor vital signs and CBGs as ordered, Perform/monitor CIWA, COWS, AIMS and Fall Risk screenings as ordered, Perform wound care treatments as ordered.  Evaluation of Outcomes: Progressing   LCSW Treatment Plan for Primary Diagnosis: Schizoaffective disorder, bipolar type (HCC) Long Term Goal(s): Safe transition to appropriate next level of care at discharge, Engage patient in therapeutic group addressing interpersonal concerns.  Short Term Goals: Engage patient in aftercare planning with referrals and resources and Increase skills for wellness and recovery  Therapeutic Interventions: Assess for all discharge needs, 1 to 1 time with Social worker, Explore available resources and support systems, Assess for adequacy in community support network, Educate family and significant other(s) on suicide prevention, Complete Psychosocial Assessment, Interpersonal group therapy.  Evaluation of Outcomes: Progressing   Progress in Treatment: Attending groups: Yes. Participating in groups: No. Taking medication as prescribed: Yes. Toleration medication: Yes. Family/Significant other contact made: Yes, individual(s) contacted:  Pt's HCPOA/cousin, Casimiro NeedleMichael, has been contacted. Patient understands diagnosis: No. Discussing patient identified problems/goals with staff: Yes. Medical problems stabilized or resolved: Yes. Denies suicidal/homicidal ideation: Yes. Issues/concerns per patient self-inventory: No. Other: n/a  New problem(s) identified: No, Describe:  No new problems identified  New Short Term/Long Term Goal(s):  Patient Goals:  Pt refused to come to treatment team  Discharge Plan or Barriers: Tentative discharge plan is for pt to return to his group home and resume ACT services with [facility].  Reason for  Continuation of Hospitalization: Mania Medication stabilization  Estimated Length of Stay: 5-7 days   Recreational Therapy: Patient Stressors: N/A  Patient Goal: Patient will engage in groups with a calm and appropriate mood at least 2x within 5 recreation therapy group sessions  Attendees: Patient: 05/21/2018 3:18 PM  Physician: Donita BrooksSerita O'Neal, MD 05/21/2018 3:18 PM  Nursing: Hulan AmatoGwen Farrish, RN 05/21/2018 3:18 PM  RN Care Manager: 05/21/2018 3:18 PM  Social Worker: Huey RomansSonya Arcenio Mullaly, LCSW 05/21/2018 3:18 PM  Recreational Therapist: Garret ReddishShay Outlaw, LRT 05/21/2018 3:18 PM  Other: Heidi DachKelsey Craig, LCSW 05/21/2018 3:18 PM  Other: Damian LeavellJohn Mullins, Chaplain 05/21/2018 3:18 PM  Other: Johny Shearsassandra Jarrett, LCSWA 05/21/2018 3:18 PM    Scribe for Treatment Team: Alease FrameSonya S Yzabelle Calles, LCSW 05/21/2018 3:18 PM

## 2018-05-21 NOTE — [facility] Group Notes (Signed)
LCSW Group Therapy Note  05/21/2018 1:00 pm  Type of Therapy/Topic:  Group Therapy:  Emotion Regulation  Participation Level:  Minimal   Description of Group:    The purpose of this group is to assist patients in learning to regulate negative emotions and experience positive emotions. Patients will be guided to discuss ways in which they have been vulnerable to their negative emotions. These vulnerabilities will be juxtaposed with experiences of positive emotions or situations, and patients will be challenged to use positive emotions to combat negative ones. Special emphasis will be placed on coping with negative emotions in conflict situations, and patients will process healthy conflict resolution skills.  Therapeutic Goals: 1. Patient will identify two positive emotions or experiences to reflect on in order to balance out negative emotions 2. Patient will label two or more emotions that they find the most difficult to experience 3. Patient will demonstrate positive conflict resolution skills through discussion and/or role plays  Summary of Patient Progress:  [name] did not actively participate in today's group discussion on emotion regulation.  [name] did share that the one emotion that he finds to be the most difficult for him to experience is "feeling confused".  Oscar Duke left out of group once for no apparent reason, but did not display any inappropriate behavior towards anyone while in the group.     Therapeutic Modalities:   Cognitive Behavioral Therapy Feelings Identification Dialectical Behavioral Therapy

## 2018-05-22 DIAGNOSIS — F250 Schizoaffective disorder, bipolar type: Secondary | ICD-10-CM

## 2018-05-22 DIAGNOSIS — K5900 Constipation, unspecified: Secondary | ICD-10-CM

## 2018-05-22 DIAGNOSIS — R569 Unspecified convulsions: Secondary | ICD-10-CM

## 2018-05-22 DIAGNOSIS — E119 Type 2 diabetes mellitus without complications: Secondary | ICD-10-CM

## 2018-05-22 DIAGNOSIS — K219 Gastro-esophageal reflux disease without esophagitis: Secondary | ICD-10-CM

## 2018-05-22 DIAGNOSIS — I10 Essential (primary) hypertension: Secondary | ICD-10-CM

## 2018-05-22 LAB — GLUCOSE, CAPILLARY
GLUCOSE-CAPILLARY: 138 mg/dL — AB (ref 70–99)
Glucose-Capillary: 159 mg/dL — ABNORMAL HIGH (ref 70–99)
Glucose-Capillary: 144 mg/dL — ABNORMAL HIGH (ref 70–99)
Glucose-Capillary: 131 mg/dL — ABNORMAL HIGH (ref 70–99)

## 2018-05-22 MED ORDER — GUAIFENESIN-DM 100-10 MG/5ML PO SYRP
5.0000 mL | ORAL_SOLUTION | Freq: Four times a day (QID) | ORAL | Status: DC | PRN
  Filled 2018-05-22: qty 10

## 2018-05-22 NOTE — Progress Notes (Signed)
Patient ID: Gari CrownMark A Duke, male   DOB: 08-17-1971, 47 y.o.   MRN: [id number] CSW followed up with [name].  CSW asked Ms. Ray if she was still open to coming to meet with pt to assess if he could come back to reside at her group home.  CSW informed Ms Rosalia HammersRay that his attending physician feels that he is stable enough to be discharged.  Ms. Rosalia HammersRay informed CSW that she would be able to come visit with pt on Friday, May 23, 2018 around 12:45 PM. CSW will inform pt, his attending physician, and CSWs that will be working with him on May 23, 2018.

## 2018-05-22 NOTE — Progress Notes (Signed)
Recreation Therapy Notes  Date: 05/22/2018  Time: 3:00pm  Location: Craft room  Behavioral response: N/A  Group Type: Game  Participation level: N/A  Communication: Patient did not attend group.  Comments: N/A  Clifford Benninger LRT/CTRS        Mihira Tozzi 05/22/2018 3:35 PM

## 2018-05-22 NOTE — [facility] Group Notes (Signed)
  05/22/2018  Time: 1PM  Type of Therapy/Topic:  Group Therapy:  Balance in Life  Participation Level:  Minimal  Description of Group:   This group will address the concept of balance and how it feels and looks when one is unbalanced. Patients will be encouraged to process areas in their lives that are out of balance and identify reasons for remaining unbalanced. Facilitators will guide patients in utilizing problem-solving interventions to address and correct the stressor making their life unbalanced. Understanding and applying boundaries will be explored and addressed for obtaining and maintaining a balanced life. Patients will be encouraged to explore ways to assertively make their unbalanced needs known to significant others in their lives, using other group members and facilitator for support and feedback.  Therapeutic Goals: 1. Patient will identify two or more emotions or situations they have that consume much of in their lives. 2. Patient will identify signs/triggers that life has become out of balance:  3. Patient will identify two ways to set boundaries in order to achieve balance in their lives:  4. Patient will demonstrate ability to communicate their needs through discussion and/or role plays  Summary of Patient Progress: Pt attended group and was able to contribute when prompted by CSW; however, pt often had to be redirected as he would discuss things that were unrelated to the group topic. PT was able to accept redirection from CSW. PT reported one area of his life he'd like to devote less attention to is, "my stress." Pt reported one area of his life he'd like to devote more attention to is, "my mental health."    Therapeutic Modalities:   Cognitive Behavioral Therapy Solution-Focused Therapy Assertiveness Training  Heidi DachKelsey Lerry Cordrey, MSW, LCSW Clinical Social Worker 05/22/2018 2:39 PM

## 2018-05-22 NOTE — Progress Notes (Signed)
Patient ID: Gari CrownMark A Duke, male   DOB: [birth year], 47 y.o.   MRN: [id number] PER STATE REGULATIONS 482.30  THIS CHART WAS REVIEWED FOR MEDICAL NECESSITY WITH RESPECT TO THE PATIENT'S ADMISSION/ DURATION OF STAY.  NEXT REVIEW DATE: 05/26/2018  Willa RoughJENNIFER JONES Sante Biedermann, RN, BSN CASE MANAGER

## 2018-05-22 NOTE — Progress Notes (Addendum)
[facility] MD Progress Note  05/22/2018 2:19 PM Oscar Duke  MRN:  [id number] Subjective:    Patient appears to be back at his baseline.  Social worker has contacted his previous group home and they will come see him tomorrow.   Patient reports  nonproductive cough and requests Robitussin-DM.  Patient denies feeling depressed.  He denies suicidal or homicidal ideations.  He denies hallucinations.  He denies paranoia.  He is not agitated today.   Principal Problem: Schizoaffective disorder, bipolar type (HCC) Diagnosis:   Patient Active Problem List   Diagnosis Date Noted  . Schizoaffective disorder, bipolar type (HCC) [F25.0] 05/14/2018  . Somnolence [R40.0] 10/05/2017  . Adverse drug interaction with prescription medication [T50.905A] 10/05/2017  . Hypertension [I10]   . Diabetes mellitus without complication (HCC) [E11.9]   . Bipolar 1 disorder (HCC) [F31.9]   . Seizures (HCC) [R56.9]   . Schizophrenia, acute (HCC) [F23] 05/14/2017   Total Time spent with patient, evaluating and treating patient; reviewing chart, discussing pt with treatment team: 25 min Past Psychiatric History: Multiple previous psych hospitalizations-[place], [facility] [place].  Pt with longstanding hx of mental illness (schizophrenia vs schizoaffective, bipolar type).    Past Medical History:  Past Medical History:  Diagnosis Date  . Bipolar 1 disorder (HCC)   . Diabetes mellitus without complication (HCC)   . Hyperlipidemia   . Hypertension   . 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 Schizophrenia, acute (HCC)   . Seizures (HCC)     Past Surgical History:  Procedure Laterality Date  . JOINT REPLACEMENT     Family History: History reviewed. No pertinent family history. Family Psychiatric  History: unknown Social History:  Social History   Substance and Sexual Activity  Alcohol Use No     Social History   Substance and Sexual Activity  Drug Use Yes  . Types: Marijuana    Comment: quit 5 years ago    Social History   Socioeconomic History  . Marital status: Single    Spouse name: Not on file  . Number of children: Not on file  . Years of education: Not on file  . Highest education level: Not on file  Occupational History  . Not on file  Social Needs  . Financial resource strain: Not on file  . Food insecurity:    Worry: Not on file    Inability: Not on file  . Transportation needs:    Medical: Not on file    Non-medical: Not on file  Tobacco Use  . Smoking status: Never Smoker  . Smokeless tobacco: Never Used  Substance and Sexual Activity  . Alcohol use: No  . Drug use: Yes    Types: Marijuana    Comment: quit 5 years ago  . Sexual activity: Not on file  Lifestyle  . Physical activity:    Days per week: Not on file    Minutes per session: Not on file  . Stress: Not on file  Relationships  . Social connections:    Talks on phone: Not on file    Gets together: Not on file    Attends religious service: Not on file    Active member of club or organization: Not on file    Attends meetings of clubs or organizations: Not on file    Relationship status: Not on file  Other Topics Concern  . Not on file  Social History Narrative  . Not on file   Additional Social  History: pt was living in a group home prior to hospitalization.  He has a cousin, Beckie Salts, that pt states is his HCPOA.    Sleep: improving  Appetite:  Good  Current Medications: Current Facility-Administered Medications  Medication Dose Route Frequency Provider Last Rate Last Dose  . acetaminophen (TYLENOL) tablet 650 mg  650 mg Oral Q6H PRN Clapacs, Jackquline Denmark, MD   650 mg at 05/19/18 2132  . alum & mag hydroxide-simeth (MAALOX/MYLANTA) 200-200-20 MG/5ML suspension 30 mL  30 mL Oral Q4H PRN Clapacs, John T, MD      . amLODipine (NORVASC) tablet 5 mg  5 mg Oral Daily Clapacs, Jackquline Denmark, MD   5 mg at 05/22/18 0816  . benztropine (COGENTIN) tablet 1 mg  1 mg Oral  BID Clapacs, Jackquline Denmark, MD   1 mg at 05/22/18 0817  . clonazePAM (KLONOPIN) tablet 1 mg  1 mg Oral TID [name], MD   1 mg at 05/22/18 0817  . divalproex (DEPAKOTE) DR tablet 1,000 mg  1,000 mg Oral QHS [name], MD   1,000 mg at 05/21/18 2116  . divalproex (DEPAKOTE) DR tablet 500 mg  500 mg Oral BID [name], MD   500 mg at 05/22/18 0634  . guaiFENesin-dextromethorphan (ROBITUSSIN DM) 100-10 MG/5ML syrup 5-10 mL  5-10 mL Oral Q6H PRN [name], MD      . hydrOXYzine (ATARAX/VISTARIL) tablet 50 mg  50 mg Oral Q4H PRN Clapacs, Jackquline Denmark, MD   50 mg at 05/20/18 2123  . ibuprofen (ADVIL,MOTRIN) tablet 400 mg  400 mg Oral Q6H PRN [name], MD   400 mg at 05/18/18 0855  . insulin aspart (novoLOG) injection 0-15 Units  0-15 Units Subcutaneous TID WC Clapacs, Jackquline Denmark, MD   2 Units at 05/22/18 1153  . insulin detemir (LEVEMIR) injection 28 Units  28 Units Subcutaneous Daily Clapacs, Jackquline Denmark, MD   28 Units at 05/22/18 0719  . lactulose (CHRONULAC) 10 GM/15ML solution 10 g  10 g Oral BID Clapacs, Jackquline Denmark, MD   10 g at 05/22/18 0817  . levETIRAcetam (KEPPRA) tablet 1,000 mg  1,000 mg Oral QHS Clapacs, John T, MD   1,000 mg at 05/21/18 2117  . lisinopril (PRINIVIL,ZESTRIL) tablet 20 mg  20 mg Oral Daily Clapacs, Jackquline Denmark, MD   20 mg at 05/22/18 0817  . LORazepam (ATIVAN) tablet 2 mg  2 mg Oral Q4H PRN Pucilowska, Jolanta B, MD   2 mg at 05/20/18 0006   Or  . LORazepam (ATIVAN) injection 2 mg  2 mg Intramuscular Q4H PRN Pucilowska, Jolanta B, MD      . magnesium hydroxide (MILK OF MAGNESIA) suspension 30 mL  30 mL Oral Daily PRN Clapacs, John T, MD      . neomycin-bacitracin-polymyxin (NEOSPORIN) ointment   Topical BID [name], MD      . OLANZapine (ZYPREXA) tablet 15 mg  15 mg Oral TID PRN Pucilowska, Jolanta B, MD   15 mg at 05/16/18 2039   Or  . OLANZapine (ZYPREXA) injection 10 mg  10 mg Intramuscular TID PRN Pucilowska, Jolanta B, MD      . pantoprazole (PROTONIX) EC tablet 40  mg  40 mg Oral Daily Clapacs, Jackquline Denmark, MD   40 mg at 05/22/18 0817  . perphenazine (TRILAFON) tablet 8 mg  8 mg Oral TID Clapacs, Jackquline Denmark, MD   8 mg at 05/22/18 1151  . traZODone (DESYREL) tablet 100 mg  100 mg Oral  Starla Link, MD   100 mg at 05/21/18 2117    Lab Results:  Results for orders placed or performed during the hospital encounter of 05/14/18 (from the past 48 hour(s))  Glucose, capillary     Status: Abnormal   Collection Time: 05/20/18  4:16 PM  Result Value Ref Range   Glucose-Capillary 143 (H) 70 - 99 mg/dL   Comment 1 Notify RN   Glucose, capillary     Status: Abnormal   Collection Time: 05/20/18  8:52 PM  Result Value Ref Range   Glucose-Capillary 125 (H) 70 - 99 mg/dL  Glucose, capillary     Status: Abnormal   Collection Time: 05/21/18  7:03 AM  Result Value Ref Range   Glucose-Capillary 130 (H) 70 - 99 mg/dL   Comment 1 Notify RN   Valproic acid level     Status: Abnormal   Collection Time: 05/21/18  7:12 AM  Result Value Ref Range   Valproic Acid Lvl 112 (H) 50.0 - 100.0 ug/mL    Comment: Performed at [facility], [address] [place], [place] [address]  Hepatic function panel     Status: None   Collection Time: 05/21/18  7:12 AM  Result Value Ref Range   Total Protein 6.9 6.5 - 8.1 g/dL   Albumin 3.8 3.5 - 5.0 g/dL   AST 33 15 - 41 U/L   ALT 36 0 - 44 U/L    Comment: Please note change in reference range.   Alkaline Phosphatase 60 38 - 126 U/L   Total Bilirubin 0.5 0.3 - 1.2 mg/dL   Bilirubin, Direct <6.0 0.0 - 0.2 mg/dL    Comment: Please note change in reference range.   Indirect Bilirubin NOT CALCULATED 0.3 - 0.9 mg/dL    Comment: Performed at [facility], [address] [place], [place] [address]  Ammonia     Status: Abnormal   Collection Time: 05/21/18  7:12 AM  Result Value Ref Range   Ammonia 57 (H) 9 - 35 umol/L    Comment: Performed at [facility], [address] [place], [place] [address]  Glucose,  capillary     Status: Abnormal   Collection Time: 05/21/18 11:37 AM  Result Value Ref Range   Glucose-Capillary 145 (H) 70 - 99 mg/dL  Glucose, capillary     Status: Abnormal   Collection Time: 05/21/18  4:16 PM  Result Value Ref Range   Glucose-Capillary 162 (H) 70 - 99 mg/dL  Glucose, capillary     Status: Abnormal   Collection Time: 05/21/18  8:51 PM  Result Value Ref Range   Glucose-Capillary 139 (H) 70 - 99 mg/dL  Glucose, capillary     Status: Abnormal   Collection Time: 05/22/18  6:50 AM  Result Value Ref Range   Glucose-Capillary 159 (H) 70 - 99 mg/dL  Glucose, capillary     Status: Abnormal   Collection Time: 05/22/18 11:50 AM  Result Value Ref Range   Glucose-Capillary 138 (H) 70 - 99 mg/dL    Blood Alcohol level:  Lab Results  Component Value Date   ETH <10 05/13/2018   ETH <5 04/13/2017    Metabolic Disorder Labs: Lab Results  Component Value Date   HGBA1C 5.6 05/15/2018   MPG 114.02 05/15/2018   No results found for: PROLACTIN Lab Results  Component Value Date   CHOL 93 05/15/2018   TRIG 61 05/15/2018   HDL 45 05/15/2018   CHOLHDL 2.1 05/15/2018  VLDL 12 05/15/2018   LDLCALC 36 05/15/2018    Physical Findings: AIMS:  , ,  ,  ,    CIWA:    COWS:     Musculoskeletal: Strength & Muscle Tone:within normal limits Gait & Station:normal Patient leans:N/A  Psychiatric Specialty Exam: Physical Exam  Nursing note and vitals reviewed. Constitutional:  Tall, misshapen head with missing/thin patches of hair  Cardiovascular: Normal rate and regular rhythm.  Respiratory: Effort normal and breath sounds normal. No respiratory distress. He has no wheezes. He has no rales.    Review of Systems  Constitutional: Negative for diaphoresis and malaise/fatigue.  Eyes: Negative for pain.  Respiratory: Negative for shortness of breath.   Cardiovascular: Negative for chest pain.  Gastrointestinal: Negative for abdominal pain, nausea and vomiting.   Musculoskeletal: Negative for myalgias.  Skin: Negative for rash.       Skin discoloration on left lower leg (pt state it's due to taking "[place]")   Neurological: Negative for weakness and headaches.    Blood pressure (!) 148/92, pulse 100, temperature 98 F (36.7 C), temperature source Oral, resp. rate 18, height 6\' 4"  (1.93 m), weight 110.7 kg (244 lb), SpO2 99 %.Body mass index is 29.7 kg/m.  General Appearance: tall, becoming less intrusive, hygiene fair; grooming improved-wearing clean clothes-plaid button down shirt and jeans.    Eye Contact:  Good  Speech:  Verbose but according to family that is his baseline  Volume:  Increased  Mood:  "fine"  Affect: no irritability noted today; smiles appropriately.   Thought Process:  Tangential, flight of ideas  Orientation:  Full (Time, Place, and Person)  Thought Content:preoccupation with discharge  Suicidal Thoughts:  pt denies  Homicidal Thoughts:  pt denies  Memory:  recent memory intact  Judgement:  Poor  Insight:  Lacking  Psychomotor Activity:  Normal  Concentration:  Concentration: Poor  Recall:  NA  Fund of Knowledge:  NA  Language:  Fair  Akathisia:  No  AIMS (if indicated):   0  Assets:  Others:  has established outpt mental health care  ADL's: moderately intact  Cognition:  Impaired,  Mild  Sleep:  Number of Hours: 7     Treatment Plan Summary:   HTN Diabetes  Seizure disorder Constipation GERD  Daily contact with patient to assess and evaluate symptoms and progress in treatment and Medication management.    Schizoaffective, bipolar type.  Pt appears to be at baseline.  Group home to come see him tomorrow.  Hopeful for d/c at that time.   Repeat valproic acid level should be done in 1 week.    Plan: Schizoaffective disorder, bipolar type -perphenazine 8 mg TID -valproic acid level 112 on total daily dose of 2250 mg of depakote. Also, ammonia level elevated at 57.   LFTs wnl.  -depakote adjusted  from 750 mg TID to 500 mg q 6am, 500 mg q 2pm and 1000 mg qhs.  (Reduced total daily dose from 2250 mg to 2000 mg) -klonopin 1 mg TID (0800, 1400, 2200) -cogentin 1 mg BID for EPS prophylaxis -trazodone reduced from 300 to 100 mg qhs  Non productive cough-lungs clear -Robitussin DM 5-10 mL q 6 hrs prn cough     Observation Level/Precautions:  15 minute checks, seizure precautions  Laboratory:  CBC-reviewed Chemistry Profile-reviewed HbAIC-5.6 UDS-Positive for benzos lipid panel-Total Cholesterol 93, HDL 45, LDL 36, Trig 61 TSH-2.117 EKG-QTc 447  Psychotherapy:  Group therapies when appropriate  Medications:  See above  Consultations:  N/A  Discharge: group home; hopefully will be discharged on 7/12          [name], MD 05/22/2018, 2:19 PM   Patient ID: Gari Crown, male   DOB: December 22, 1970, 47 y.o.   MRN: [id number]

## 2018-05-22 NOTE — [facility] Group Notes (Signed)
Oscar Duke Group Therapy Note 05/22/2018 1:15pm  Type of Therapy and Topic:  Group Therapy:  Setting Goals  Participation Level:  Active  Description of Group: In this process group, patients discussed using strengths to work toward goals and address challenges.  Patients identified two positive things about themselves and one goal they were working on.  Patients were given the opportunity to share openly and support each other's plan for self-empowerment.  The group discussed the value of gratitude and were encouraged to have a daily reflection of positive characteristics or circumstances.  Patients were encouraged to identify a plan to utilize their strengths to work on current challenges and goals.  Therapeutic Goals 1. Patient will verbalize personal strengths/positive qualities and relate how these can assist with achieving desired personal goals 2. Patients will verbalize affirmation of peers plans for personal change and goal setting 3. Patients will explore the value of gratitude and positive focus as related to successful achievement of goals 4. Patients will verbalize a plan for regular reinforcement of personal positive qualities and circumstances.  Summary of Patient Progress: Oscar Duke actively participated in today's group discussion on setting goals.  Oscar Duke had to be re-directed a few times during group by Oscar Duke due to him going off topic.  Oscar Duke was able to share with other group participants that his goal since coming into the hospital is to not get so aggravated and stressed and that he has been able to achieve this goal over the past few days.      Therapeutic Modalities Cognitive Behavioral Therapy Motivational Interviewing    Oscar Duke, Oscar Duke 05/22/2018 1:59 PM

## 2018-05-22 NOTE — Plan of Care (Signed)
Patient is alert. Patient rates sleep last night as good; describes his energy as high, Depression 0/10; hopelessness 0/10 anxiety 0/10. Patient complains of anxiety. Patient has cicumstantial thinking and  Delusions of grandeur. Patient believes his cousin is coming to pick him up today. Patient stated he did not want to attend group because he was leaving. Patient reported to another nurse he did not want to attend group do to coughing. Cough medication ordered by physician PRN. Patient  Is disheveled  In appearance, loud speech and animated. Patient stated "I will always be a firecracker because my mom was the same way and I don't want to work on myself."  Patient denies SI, HI and AVH. There is no distress noted. Nurse will continue to monitor.

## 2018-05-22 NOTE — Plan of Care (Addendum)
Patient found in day room upon my arrival. Patient is visible and social this evening. Attends group. Patient continues to improve in mood and behavior. Patient is less intrusive and needy. Speech is less tangential and more organized. Patient is less hyperverbal. Processing remains impaired. Patient is unable to benefit from education, states, "I just can't remember all that you are saying. Just give me what I'm supposed to take and tell me what you want me to do." Patient temper is increasingly measured and his ability to interact appropriately continues to improve. Denies SI/HI/AVH. Reports eating and voiding adequately. CBG 139. Patient lability was reported by previous shift but is not observed this evening. Patient is cooperative and calm throughout. Compliant with HS medications and staff direction. Q 15 minute checks maintained. Will continue to monitor throughout the shift. Patient slept 7 hours. No apparent distress. Will endorse care to oncoming shift.  Problem: Coping: Goal: Ability to interact with others will improve Outcome: Progressing Goal: Ability to use eye contact when communicating with others will improve Outcome: Progressing   Problem: Self-Concept: Goal: Will verbalize positive feelings about self Outcome: Progressing   Problem: Education: Goal: Emotional status will improve Outcome: Progressing Goal: Mental status will improve Outcome: Progressing   Problem: Education: Goal: Verbalization of understanding the information provided will improve Outcome: Not Progressing   Problem: Education: Goal: Knowledge of General Education information will improve Outcome: Not Progressing

## 2018-05-22 NOTE — Progress Notes (Addendum)
Recreation Therapy Notes  Date: 05/22/2018  Time: 9:30 am  Location: Craft Room  Behavioral response: Hyperverbal  Intervention Topic: Anger Management  Discussion/Intervention:  Group content on today was focused on anger management. The group defined anger and reasons they become angry. Individuals expressed negative way they have dealt with anger in the past. Patients stated some positive ways they could deal with anger in the future. The group described how anger can affect your health and daily plans. Individuals participated in the intervention "Score your anger" where they had a chance to answer questions about themselves and get a score of their anger.  Clinical Observations/Feedback:  Patient came to group and sated when he is angry he yells and uses profanity. He got off topic but was redirected by group facilitator. Individual left group early and stated he did not want to participate in the activity while slamming the door on his way out. Gryffin Altice LRT/CTRS         Iviana Blasingame 05/22/2018 12:27 PM

## 2018-05-23 DIAGNOSIS — E119 Type 2 diabetes mellitus without complications: Secondary | ICD-10-CM

## 2018-05-23 DIAGNOSIS — R569 Unspecified convulsions: Secondary | ICD-10-CM

## 2018-05-23 DIAGNOSIS — I10 Essential (primary) hypertension: Secondary | ICD-10-CM

## 2018-05-23 DIAGNOSIS — K219 Gastro-esophageal reflux disease without esophagitis: Secondary | ICD-10-CM

## 2018-05-23 DIAGNOSIS — F250 Schizoaffective disorder, bipolar type: Secondary | ICD-10-CM

## 2018-05-23 DIAGNOSIS — K5900 Constipation, unspecified: Secondary | ICD-10-CM

## 2018-05-23 LAB — GLUCOSE, CAPILLARY
GLUCOSE-CAPILLARY: 166 mg/dL — AB (ref 70–99)
Glucose-Capillary: 123 mg/dL — ABNORMAL HIGH (ref 70–99)
Glucose-Capillary: 153 mg/dL — ABNORMAL HIGH (ref 70–99)

## 2018-05-23 NOTE — Progress Notes (Signed)
Recreation Therapy Notes   Date: 05/23/2018  Time: 9:30 am  Location: Craft Room  Behavioral response: Appropriate   Intervention Topic: Team Work  Discussion/Intervention:  Group content on today was focused on teamwork. The group identified what teamwork is. Individuals described who is a part of their team. Patients expressed why they thought teamwork is important. The group stated reasons why they thought it was easier to work with a Comptrollersmaller/larger team. Individuals discussed some positives and negatives of working with a team. Patients gave examples of past experiences they had while working with a team. The group participated in the intervention "Story in a bag", patients were in groups and were able to test their skill in a team setting.   Clinical Observations/Feedback:  Patient came to group and defined team work as working towards a common goal. He stated some people use to work top Chief of Staffbuild stuff like Best boyconstruction or Jenga. He identified Vale HavenMichael David and Lawernce KeasAunt Lou and part of his team. Individual left group early and stated he need to go lay down.  Tymel Conely LRT/CTRS          Shastina Rua 05/23/2018 12:26 PM

## 2018-05-23 NOTE — Plan of Care (Signed)
Patient denies SI/HI/AVH. Patient is seen at nurses station multiple times. Patient needs redirection. Patient is loud at times, and disrupts milieu. Patient is frequently heard using inappropriate language and requires reenforcement from staff. Patient is compliant with care, and is without injury at this time. Patient is a high fall risk but refuses to use appropriate precautions, Patient refuses to use walker.   Problem: Coping: Goal: Ability to identify and develop effective coping behavior will improve Outcome: Progressing   Problem: Education: Goal: Emotional status will improve Outcome: Progressing Goal: Verbalization of understanding the information provided will improve Outcome: Progressing   Problem: Safety: Goal: Periods of time without injury will increase Outcome: Progressing

## 2018-05-23 NOTE — Plan of Care (Signed)
Patient is improving, takes his medications and with out issues voice no concerns denies any SI/HI at this time . Patient is compliant with therapeutic regimen and doing well,  perception is disorganized, encouraged patient to continue to engage in groups. Patient denies any AVH, 15 minutes safety rounds is maintained, sleep long hours without any disturbances. Problem: Coping: Goal: Ability to identify and develop effective coping behavior will improve Outcome: Progressing Goal: Ability to interact with others will improve Outcome: Progressing Goal: Demonstration of participation in decision-making regarding own care will improve Outcome: Progressing Goal: Ability to use eye contact when communicating with others will improve Outcome: Progressing   Problem: Self-Concept: Goal: Will verbalize positive feelings about self Outcome: Progressing   Problem: Education: Goal: Knowledge of [place] [facility] Education information/materials will improve Outcome: Progressing Goal: Emotional status will improve Outcome: Progressing Goal: Mental status will improve Outcome: Progressing Goal: Verbalization of understanding the information provided will improve Outcome: Progressing   Problem: Safety: Goal: Periods of time without injury will increase Outcome: Progressing   Problem: Education: Goal: Knowledge of General Education information will improve Outcome: Progressing
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
## 2018-05-23 NOTE — [facility] Group Notes (Signed)
05/23/2018 1PM  Type of Therapy and Topic:  Group Therapy:  Feelings around Relapse and Recovery  Participation Level:  Active   Description of Group:    Patients in this group will discuss emotions they experience before and after a relapse. They will process how experiencing these feelings, or avoidance of experiencing them, relates to having a relapse. Facilitator will guide patients to explore emotions they have related to recovery. Patients will be encouraged to process which emotions are more powerful. They will be guided to discuss the emotional reaction significant others in their lives may have to patients' relapse or recovery. Patients will be assisted in exploring ways to respond to the emotions of others without this contributing to a relapse.  Therapeutic Goals: 1. Patient will identify two or more emotions that lead to a relapse for them 2. Patient will identify two emotions that result when they relapse 3. Patient will identify two emotions related to recovery 4. Patient will demonstrate ability to communicate their needs through discussion and/or role plays   Summary of Patient Progress: Pt. Came outside to practice self-care as a concept related to recovery.     Therapeutic Modalities:   Cognitive Behavioral Therapy Solution-Focused Therapy Assertiveness Training Relapse Prevention Therapy   Johny ShearsCassandra  Elex Mainwaring, LCSW 05/23/2018 1:55 PM

## 2018-05-24 LAB — GLUCOSE, CAPILLARY
GLUCOSE-CAPILLARY: 187 mg/dL — AB (ref 70–99)
GLUCOSE-CAPILLARY: 124 mg/dL — AB (ref 70–99)
GLUCOSE-CAPILLARY: 117 mg/dL — AB (ref 70–99)
Glucose-Capillary: 151 mg/dL — ABNORMAL HIGH (ref 70–99)

## 2018-05-24 NOTE — Progress Notes (Signed)
[facility] MD Progress Note  05/25/2018 7:52 AM Oscar Duke  MRN:  [id number]  Subjective:   Oscar Duke is pleasant and polite talking to me extensively about getting his GEDs. He knows addition and subtraction but no algebra or fractions. He would like to go to [employer] as soon as he is discharge.   Per nursing, the patient has been getting agitated, just the way he was on admission. We will monitor.  Principal Problem: Schizoaffective disorder, bipolar type (HCC) Diagnosis:   Patient Active Problem List   Diagnosis Date Noted  . Schizoaffective disorder, bipolar type (HCC) [F25.0] 05/14/2018    Priority: High  . Somnolence [R40.0] 10/05/2017  . Adverse drug interaction with prescription medication [T50.905A] 10/05/2017  . Hypertension [I10]   . Diabetes mellitus without complication (HCC) [E11.9]   . Bipolar 1 disorder (HCC) [F31.9]   . Seizures (HCC) [R56.9]   . Schizophrenia, acute (HCC) [F23] 05/14/2017   Total Time spent with patient: 20 minutes  Past Psychiatric History: schizoaffective disorder  Past Medical History:  Past Medical History:  Diagnosis Date  . Bipolar 1 disorder (HCC)   . Diabetes mellitus without complication (HCC)   . Hyperlipidemia   . Hypertension   . 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 Schizophrenia, acute (HCC)   . Seizures (HCC)     Past Surgical History:  Procedure Laterality Date  . JOINT REPLACEMENT     Family History: History reviewed. No pertinent family history. Family Psychiatric  History: none Social History:  Social History   Substance and Sexual Activity  Alcohol Use No     Social History   Substance and Sexual Activity  Drug Use Yes  . Types: Marijuana   Comment: quit 5 years ago    Social History   Socioeconomic History  . Marital status: Single    Spouse name: Not on file  . Number of children: Not on file  . Years of education: Not on file  . Highest education level: Not on file   Occupational History  . Not on file  Social Needs  . Financial resource strain: Not on file  . Food insecurity:    Worry: Not on file    Inability: Not on file  . Transportation needs:    Medical: Not on file    Non-medical: Not on file  Tobacco Use  . Smoking status: Never Smoker  . Smokeless tobacco: Never Used  Substance and Sexual Activity  . Alcohol use: No  . Drug use: Yes    Types: Marijuana    Comment: quit 5 years ago  . Sexual activity: Not on file  Lifestyle  . Physical activity:    Days per week: Not on file    Minutes per session: Not on file  . Stress: Not on file  Relationships  . Social connections:    Talks on phone: Not on file    Gets together: Not on file    Attends religious service: Not on file    Active member of club or organization: Not on file    Attends meetings of clubs or organizations: Not on file    Relationship status: Not on file  Other Topics Concern  . Not on file  Social History Narrative  . Not on file   Additional Social History:                         Sleep: Fair  Appetite:  Fair  Current Medications: Current Facility-Administered Medications  Medication Dose Route Frequency Provider Last Rate Last Dose  . acetaminophen (TYLENOL) tablet 650 mg  650 mg Oral Q6H PRN Clapacs, Jackquline DenmarkJohn T, MD   650 mg at 05/23/18 1454  . alum & mag hydroxide-simeth (MAALOX/MYLANTA) 200-200-20 MG/5ML suspension 30 mL  30 mL Oral Q4H PRN Clapacs, John T, MD      . amLODipine (NORVASC) tablet 5 mg  5 mg Oral Daily Clapacs, Jackquline DenmarkJohn T, MD   5 mg at 05/24/18 0828  . benztropine (COGENTIN) tablet 1 mg  1 mg Oral BID Clapacs, Jackquline DenmarkJohn T, MD   1 mg at 05/24/18 1627  . clonazePAM (KLONOPIN) tablet 1 mg  1 mg Oral TID [name]'Neal, Sarita, MD   1 mg at 05/24/18 2147  . divalproex (DEPAKOTE) DR tablet 1,000 mg  1,000 mg Oral QHS [name]'Neal, Sarita, MD   1,000 mg at 05/24/18 2148  . divalproex (DEPAKOTE) DR tablet 500 mg  500 mg Oral BID [name]'Neal, Sarita, MD   500 mg at  05/25/18 0629  . guaiFENesin-dextromethorphan (ROBITUSSIN DM) 100-10 MG/5ML syrup 5-10 mL  5-10 mL Oral Q6H PRN [name]'Neal, Sarita, MD      . hydrOXYzine (ATARAX/VISTARIL) tablet 50 mg  50 mg Oral Q4H PRN Clapacs, Jackquline DenmarkJohn T, MD   50 mg at 05/24/18 1147  . ibuprofen (ADVIL,MOTRIN) tablet 400 mg  400 mg Oral Q6H PRN [name]'Neal, Sarita, MD   400 mg at 05/18/18 0855  . insulin aspart (novoLOG) injection 0-15 Units  0-15 Units Subcutaneous TID WC Clapacs, Jackquline DenmarkJohn T, MD   2 Units at 05/24/18 1626  . insulin detemir (LEVEMIR) injection 28 Units  28 Units Subcutaneous Daily Clapacs, Jackquline DenmarkJohn T, MD   28 Units at 05/24/18 [phone]  . lactulose (CHRONULAC) 10 GM/15ML solution 10 g  10 g Oral BID Clapacs, Jackquline DenmarkJohn T, MD   10 g at 05/24/18 1631  . levETIRAcetam (KEPPRA) tablet 1,000 mg  1,000 mg Oral QHS Clapacs, John T, MD   1,000 mg at 05/24/18 2148  . lisinopril (PRINIVIL,ZESTRIL) tablet 20 mg  20 mg Oral Daily Clapacs, Jackquline DenmarkJohn T, MD   20 mg at 05/24/18 0827  . LORazepam (ATIVAN) tablet 2 mg  2 mg Oral Q4H PRN Avis Mcmahill B, MD   2 mg at 05/24/18 1147   Or  . LORazepam (ATIVAN) injection 2 mg  2 mg Intramuscular Q4H PRN Gaelan Glennon B, MD      . magnesium hydroxide (MILK OF MAGNESIA) suspension 30 mL  30 mL Oral Daily PRN Clapacs, John T, MD      . neomycin-bacitracin-polymyxin (NEOSPORIN) ointment   Topical BID [name]'Neal, Sarita, MD      . OLANZapine (ZYPREXA) tablet 15 mg  15 mg Oral TID PRN Cletis Clack B, MD   15 mg at 05/16/18 2039   Or  . OLANZapine (ZYPREXA) injection 10 mg  10 mg Intramuscular TID PRN Lakie Mclouth B, MD      . pantoprazole (PROTONIX) EC tablet 40 mg  40 mg Oral Daily Clapacs, Jackquline DenmarkJohn T, MD   40 mg at 05/24/18 0828  . perphenazine (TRILAFON) tablet 8 mg  8 mg Oral TID Clapacs, Jackquline DenmarkJohn T, MD   8 mg at 05/24/18 1627  . traZODone (DESYREL) tablet 100 mg  100 mg Oral QHS [name]'Neal, Sarita, MD   100 mg at 05/24/18 2148    Lab Results:  Results for orders placed or performed during the hospital  encounter of 05/14/18 (from the  past 48 hour(s))  Glucose, capillary     Status: Abnormal   Collection Time: 05/23/18 11:18 AM  Result Value Ref Range   Glucose-Capillary 166 (H) 70 - 99 mg/dL  Glucose, capillary     Status: Abnormal   Collection Time: 05/23/18  4:04 PM  Result Value Ref Range   Glucose-Capillary 153 (H) 70 - 99 mg/dL  Glucose, capillary     Status: Abnormal   Collection Time: 05/24/18  7:09 AM  Result Value Ref Range   Glucose-Capillary 151 (H) 70 - 99 mg/dL   Comment 1 Notify RN   Glucose, capillary     Status: Abnormal   Collection Time: 05/24/18 11:24 AM  Result Value Ref Range   Glucose-Capillary 187 (H) 70 - 99 mg/dL   Comment 1 Notify RN   Glucose, capillary     Status: Abnormal   Collection Time: 05/24/18  4:26 PM  Result Value Ref Range   Glucose-Capillary 124 (H) 70 - 99 mg/dL   Comment 1 Notify RN   Glucose, capillary     Status: Abnormal   Collection Time: 05/24/18  8:37 PM  Result Value Ref Range   Glucose-Capillary 117 (H) 70 - 99 mg/dL   Comment 1 Notify RN   Glucose, capillary     Status: Abnormal   Collection Time: 05/25/18  7:14 AM  Result Value Ref Range   Glucose-Capillary 173 (H) 70 - 99 mg/dL   Comment 1 Notify RN     Blood Alcohol level:  Lab Results  Component Value Date   ETH <10 05/13/2018   ETH <5 04/13/2017    Metabolic Disorder Labs: Lab Results  Component Value Date   HGBA1C 5.6 05/15/2018   MPG 114.02 05/15/2018   No results found for: PROLACTIN Lab Results  Component Value Date   CHOL 93 05/15/2018   TRIG 61 05/15/2018   HDL 45 05/15/2018   CHOLHDL 2.1 05/15/2018   VLDL 12 05/15/2018   LDLCALC 36 05/15/2018    Physical Findings: AIMS: Facial and Oral Movements Muscles of Facial Expression: None, normal Lips and Perioral Area: None, normal Jaw: None, normal Tongue: None, normal,Extremity Movements Upper (arms, wrists, hands, fingers): None, normal Lower (legs, knees, ankles, toes): None, normal, Trunk  Movements Neck, shoulders, hips: None, normal, Overall Severity Severity of abnormal movements (highest score from questions above): None, normal Incapacitation due to abnormal movements: None, normal Patient's awareness of abnormal movements (rate only patient's report): No Awareness, Dental Status Current problems with teeth and/or dentures?: No Does patient usually wear dentures?: No  CIWA:    COWS:     Musculoskeletal: Strength & Muscle Tone: within normal limits Gait & Station: normal Patient leans: N/A  Psychiatric Specialty Exam: Physical Exam  Nursing note and vitals reviewed. Psychiatric: He has a normal mood and affect. His speech is normal and behavior is normal. Thought content normal. Cognition and memory are normal. He expresses impulsivity.    Review of Systems  Neurological: Negative.   Psychiatric/Behavioral: Negative.   All other systems reviewed and are negative.   Blood pressure 112/71, pulse 92, temperature 97.8 F (36.6 C), temperature source Oral, resp. rate 18, height 6\' 4"  (1.93 m), weight 110.7 kg (244 lb), SpO2 100 %.Body mass index is 29.7 kg/m.  General Appearance: Casual  Eye Contact:  Good  Speech:  Clear and Coherent  Volume:  Normal  Mood:  Euthymic  Affect:  Appropriate  Thought Process:  Goal Directed and Descriptions of Associations: Intact  Orientation:  Full (Time, Place, and Person)  Thought Content:  WDL  Suicidal Thoughts:  No  Homicidal Thoughts:  No  Memory:  Immediate;   Poor Recent;   Poor Remote;   Poor  Judgement:  Poor  Insight:  Lacking  Psychomotor Activity:  Normal  Concentration:  Concentration: Poor and Attention Span: Poor  Recall:  Poor  Fund of Knowledge:  Fair  Language:  Fair  Akathisia:  No  Handed:  Right  AIMS (if indicated):     Assets:  Communication Skills Desire for Improvement Financial Resources/Insurance Physical Health Resilience Social Support  ADL's:  Intact  Cognition:  WNL  Sleep:   Number of Hours: 6.3     Treatment Plan Summary: Daily contact with patient to assess and evaluate symptoms and progress in treatment and Medication management   Mr. Rio is a 47 year old male with a history of schizoaffective disorder admitted psychotic, agitated and disorganized who improved tremedously. Awaiting placement.   Plan: Schizoaffective disorder, bipolar type -perphenazine 8 mg TID -valproic acid level 112 on total daily dose of 2250 mg of depakote. Also, ammonia level elevated at 57.  LFTs wnl.  -depakote adjusted from 750 mg TID to 500 mg q 6am, 500 mg q 2pm and 1000 mg qhs. (Reduced total daily dose from 2250 mg to 2000 mg on 05/21/18) -klonopin 1 mg TID (0800, 1400, 2200) -cogentin 1 mg BID for EPS prophylaxis -trazodone reduced from 300 to 100 mg qhs, due to trazodone being too activating at higher dose.  Non productive cough-lungs clear -Robitussin DM 5-10 mL q 6 hrs prn cough  05/24/2018 -Group home did not come Friday -no medication changes 05/25/2018 -Zyprexa is available for agitation PO or IM PRN  -will check VPA level and ammonia in am again     Kristine Linea, MD 05/25/2018, 7:52 AM

## 2018-05-24 NOTE — Progress Notes (Signed)
[facility] MD Progress Note  05/24/2018 1:31 PM CRANFORD BLESSINGER  MRN:  [id number]  Subjective:    Mr. Delcastillo is cool and collected, denies any symptoms of mental illness, and is in control of his behavior. He tolerates medications well. He is anxiously awaiting placement. Yesterday, he was waiting all day long for a group home representative who did not show up. He is upset with peers who only want to watch news with killings and war. He missed his favorite show again here. He thinks that 3rd warld war is coming as predicted by his [employer] family members.   Principal Problem: Schizoaffective disorder, bipolar type (HCC) Diagnosis:   Patient Active Problem List   Diagnosis Date Noted  . Schizoaffective disorder, bipolar type (HCC) [F25.0] 05/14/2018    Priority: High  . Somnolence [R40.0] 10/05/2017  . Adverse drug interaction with prescription medication [T50.905A] 10/05/2017  . Hypertension [I10]   . Diabetes mellitus without complication (HCC) [E11.9]   . Bipolar 1 disorder (HCC) [F31.9]   . Seizures (HCC) [R56.9]   . Schizophrenia, acute (HCC) [F23] 05/14/2017   Total Time spent with patient: 20 minutes  Past Psychiatric History: schizophrenia  Past Medical History:  Past Medical History:  Diagnosis Date  . Bipolar 1 disorder (HCC)   . Diabetes mellitus without complication (HCC)   . Hyperlipidemia   . Hypertension   . 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 Schizophrenia, acute (HCC)   . Seizures (HCC)     Past Surgical History:  Procedure Laterality Date  . JOINT REPLACEMENT     Family History: History reviewed. No pertinent family history. Family Psychiatric  History: unknown Social History:  Social History   Substance and Sexual Activity  Alcohol Use No     Social History   Substance and Sexual Activity  Drug Use Yes  . Types: Marijuana   Comment: quit 5 years ago    Social History   Socioeconomic History  . Marital status: Single   Spouse name: Not on file  . Number of children: Not on file  . Years of education: Not on file  . Highest education level: Not on file  Occupational History  . Not on file  Social Needs  . Financial resource strain: Not on file  . Food insecurity:    Worry: Not on file    Inability: Not on file  . Transportation needs:    Medical: Not on file    Non-medical: Not on file  Tobacco Use  . Smoking status: Never Smoker  . Smokeless tobacco: Never Used  Substance and Sexual Activity  . Alcohol use: No  . Drug use: Yes    Types: Marijuana    Comment: quit 5 years ago  . Sexual activity: Not on file  Lifestyle  . Physical activity:    Days per week: Not on file    Minutes per session: Not on file  . Stress: Not on file  Relationships  . Social connections:    Talks on phone: Not on file    Gets together: Not on file    Attends religious service: Not on file    Active member of club or organization: Not on file    Attends meetings of clubs or organizations: Not on file    Relationship status: Not on file  Other Topics Concern  . Not on file  Social History Narrative  . Not on file   Additional Social History:  Sleep: Fair  Appetite:  Fair  Current Medications: Current Facility-Administered Medications  Medication Dose Route Frequency Provider Last Rate Last Dose  . acetaminophen (TYLENOL) tablet 650 mg  650 mg Oral Q6H PRN Clapacs, Jackquline DenmarkJohn T, MD   650 mg at 05/23/18 1454  . alum & mag hydroxide-simeth (MAALOX/MYLANTA) 200-200-20 MG/5ML suspension 30 mL  30 mL Oral Q4H PRN Clapacs, John T, MD      . amLODipine (NORVASC) tablet 5 mg  5 mg Oral Daily Clapacs, Jackquline DenmarkJohn T, MD   5 mg at 05/24/18 0828  . benztropine (COGENTIN) tablet 1 mg  1 mg Oral BID Clapacs, Jackquline DenmarkJohn T, MD   1 mg at 05/24/18 0828  . clonazePAM (KLONOPIN) tablet 1 mg  1 mg Oral TID [name]'Neal, Sarita, MD   1 mg at 05/24/18 0828  . divalproex (DEPAKOTE) DR tablet 1,000 mg  1,000 mg Oral QHS  [name]'Neal, Sarita, MD   1,000 mg at 05/23/18 2133  . divalproex (DEPAKOTE) DR tablet 500 mg  500 mg Oral BID [name]'Neal, Sarita, MD   500 mg at 05/24/18 0641  . guaiFENesin-dextromethorphan (ROBITUSSIN DM) 100-10 MG/5ML syrup 5-10 mL  5-10 mL Oral Q6H PRN [name]'Neal, Sarita, MD      . hydrOXYzine (ATARAX/VISTARIL) tablet 50 mg  50 mg Oral Q4H PRN Clapacs, Jackquline DenmarkJohn T, MD   50 mg at 05/24/18 1147  . ibuprofen (ADVIL,MOTRIN) tablet 400 mg  400 mg Oral Q6H PRN [name]'Neal, Sarita, MD   400 mg at 05/18/18 0855  . insulin aspart (novoLOG) injection 0-15 Units  0-15 Units Subcutaneous TID WC Clapacs, Jackquline DenmarkJohn T, MD   3 Units at 05/24/18 1142  . insulin detemir (LEVEMIR) injection 28 Units  28 Units Subcutaneous Daily Clapacs, Jackquline DenmarkJohn T, MD   28 Units at 05/24/18 [phone]  . lactulose (CHRONULAC) 10 GM/15ML solution 10 g  10 g Oral BID Clapacs, Jackquline DenmarkJohn T, MD   10 g at 05/24/18 [id number]  . levETIRAcetam (KEPPRA) tablet 1,000 mg  1,000 mg Oral QHS Clapacs, John T, MD   1,000 mg at 05/23/18 2133  . lisinopril (PRINIVIL,ZESTRIL) tablet 20 mg  20 mg Oral Daily Clapacs, Jackquline DenmarkJohn T, MD   20 mg at 05/24/18 0827  . LORazepam (ATIVAN) tablet 2 mg  2 mg Oral Q4H PRN Mariaguadalupe Fialkowski B, MD   2 mg at 05/24/18 1147   Or  . LORazepam (ATIVAN) injection 2 mg  2 mg Intramuscular Q4H PRN Sharol Croghan B, MD      . magnesium hydroxide (MILK OF MAGNESIA) suspension 30 mL  30 mL Oral Daily PRN Clapacs, John T, MD      . neomycin-bacitracin-polymyxin (NEOSPORIN) ointment   Topical BID [name]'Neal, Sarita, MD      . OLANZapine (ZYPREXA) tablet 15 mg  15 mg Oral TID PRN Keili Hasten B, MD   15 mg at 05/16/18 2039   Or  . OLANZapine (ZYPREXA) injection 10 mg  10 mg Intramuscular TID PRN Darnice Comrie B, MD      . pantoprazole (PROTONIX) EC tablet 40 mg  40 mg Oral Daily Clapacs, Jackquline DenmarkJohn T, MD   40 mg at 05/24/18 0828  . perphenazine (TRILAFON) tablet 8 mg  8 mg Oral TID Clapacs, Jackquline DenmarkJohn T, MD   8 mg at 05/24/18 1141  . traZODone (DESYREL) tablet 100 mg  100  mg Oral QHS [name]'Neal, Sarita, MD   100 mg at 05/23/18 2134    Lab Results:  Results for orders placed or performed during the hospital encounter of  05/14/18 (from the past 48 hour(s))  Glucose, capillary     Status: Abnormal   Collection Time: 05/22/18  5:10 PM  Result Value Ref Range   Glucose-Capillary 144 (H) 70 - 99 mg/dL  Glucose, capillary     Status: Abnormal   Collection Time: 05/22/18  8:25 PM  Result Value Ref Range   Glucose-Capillary 131 (H) 70 - 99 mg/dL   Comment 1 Notify RN   Glucose, capillary     Status: Abnormal   Collection Time: 05/23/18  7:07 AM  Result Value Ref Range   Glucose-Capillary 123 (H) 70 - 99 mg/dL   Comment 1 Notify RN   Glucose, capillary     Status: Abnormal   Collection Time: 05/23/18 11:18 AM  Result Value Ref Range   Glucose-Capillary 166 (H) 70 - 99 mg/dL  Glucose, capillary     Status: Abnormal   Collection Time: 05/23/18  4:04 PM  Result Value Ref Range   Glucose-Capillary 153 (H) 70 - 99 mg/dL  Glucose, capillary     Status: Abnormal   Collection Time: 05/24/18  7:09 AM  Result Value Ref Range   Glucose-Capillary 151 (H) 70 - 99 mg/dL   Comment 1 Notify RN   Glucose, capillary     Status: Abnormal   Collection Time: 05/24/18 11:24 AM  Result Value Ref Range   Glucose-Capillary 187 (H) 70 - 99 mg/dL   Comment 1 Notify RN     Blood Alcohol level:  Lab Results  Component Value Date   ETH <10 05/13/2018   ETH <5 04/13/2017    Metabolic Disorder Labs: Lab Results  Component Value Date   HGBA1C 5.6 05/15/2018   MPG 114.02 05/15/2018   No results found for: PROLACTIN Lab Results  Component Value Date   CHOL 93 05/15/2018   TRIG 61 05/15/2018   HDL 45 05/15/2018   CHOLHDL 2.1 05/15/2018   VLDL 12 05/15/2018   LDLCALC 36 05/15/2018    Physical Findings: AIMS:  , ,  ,  ,    CIWA:    COWS:     Musculoskeletal: Strength & Muscle Tone: within normal limits Gait & Station: normal Patient leans: N/A  Psychiatric  Specialty Exam: Physical Exam  Nursing note and vitals reviewed. Psychiatric: His speech is normal and behavior is normal. Thought content normal. His mood appears anxious. Cognition and memory are impaired. He expresses impulsivity.    Review of Systems  Neurological: Negative.   Psychiatric/Behavioral: Negative.   All other systems reviewed and are negative.   Blood pressure (!) 141/71, pulse 90, temperature 98.3 F (36.8 C), temperature source Oral, resp. rate 20, height 6\' 4"  (1.93 m), weight 110.7 kg (244 lb), SpO2 100 %.Body mass index is 29.7 kg/m.  General Appearance: Casual  Eye Contact:  Good  Speech:  Clear and Coherent  Volume:  Normal  Mood:  Anxious  Affect:  Constricted  Thought Process:  Goal Directed and Descriptions of Associations: Intact  Orientation:  Full (Time, Place, and Person)  Thought Content:  WDL  Suicidal Thoughts:  No  Homicidal Thoughts:  No  Memory:  Immediate;   Fair Recent;   Fair Remote;   Fair  Judgement:  Poor  Insight:  Lacking  Psychomotor Activity:  Normal  Concentration:  Concentration: Fair and Attention Span: Fair  Recall:  [employer] of Knowledge:  Fair  Language:  Fair  Akathisia:  No  Handed:  Right  AIMS (if indicated):  Assets:  Communication Skills Desire for Improvement Financial Resources/Insurance Physical Health Resilience Social Support  ADL's:  Intact  Cognition:  WNL  Sleep:  Number of Hours: 5.3     Treatment Plan Summary: Daily contact with patient to assess and evaluate symptoms and progress in treatment and Medication management   Mr. Honse is a 47 year old male with a history of schizoaffective disorder admitted psychotic, agitated and disorganized who improved tremedously. Awaiting placement.    Plan: Schizoaffective disorder, bipolar type -perphenazine 8 mg TID -valproic acid level 112 on total daily dose of 2250 mg of depakote. Also, ammonia level elevated at 57.   LFTs wnl.  -depakote  adjusted from 750 mg TID to 500 mg q 6am, 500 mg q 2pm and 1000 mg qhs.  (Reduced total daily dose from 2250 mg to 2000 mg on 05/21/18) -klonopin 1 mg TID (0800, 1400, 2200) -cogentin 1 mg BID for EPS prophylaxis -trazodone reduced from 300 to 100 mg qhs, due to trazodone being too activating at higher dose.    Non productive cough-lungs clear -Robitussin DM 5-10 mL q 6 hrs prn cough  05/24/2018 -Group home did not come Friday -no medication changes       Kristine Linea, MD 05/24/2018, 1:31 PM

## 2018-05-24 NOTE — [facility] Group Notes (Signed)
[facility]  Date/Time:  05/24/2018 1PM  Type of Therapy and Topic:  Group Therapy:  Healthy and Unhealthy Supports  Participation Level:  Active   Description of Group:  Patients in this group were introduced to the idea of adding a variety of healthy supports to address the various needs in their lives.Patients discussed what additional healthy supports could be helpful in their recovery and wellness after discharge in order to prevent future hospitalizations.   An emphasis was placed on using counselor, doctor, therapy groups, 12-step groups, and problem-specific support groups to expand supports.  They also worked as a group on developing a specific plan for several patients to deal with unhealthy supports through boundary-setting, psychoeducation with loved ones, and even termination of relationships.   Therapeutic Goals:   1)  discuss importance of adding supports to stay well once out of the hospital  2)  compare healthy versus unhealthy supports and identify some examples of each  3)  generate ideas and descriptions of healthy supports that can be added  4)  offer mutual support about how to address unhealthy supports  5)  encourage active participation in and adherence to discharge plan    Summary of Patient Progress:  The patient stated that current healthy supports in his life are his family members while the patient reports he has no unhealthy supports.   Therapeutic Modalities:   Motivational Interviewing Brief Solution-Focused Therapy  Johny ShearsCassandra  Erman Thum, LCSW 05/24/2018 2:05PM

## 2018-05-24 NOTE — Plan of Care (Signed)
Patient continue to improve in his ability to cope and maintaining base line, no aggression or bulling his peers, complying with his medications, attends groups and participate in peer activities, responding well to treatment, denies any SI/HI/AVH, hyper verbal still persist, 15 minute rounding is maintained, sleep long hours no distress  . Problem: Coping: Goal: Ability to identify and develop effective coping behavior will improve Outcome: Progressing Goal: Ability to interact with others will improve Outcome: Progressing Goal: Demonstration of participation in decision-making regarding own care will improve Outcome: Progressing Goal: Ability to use eye contact when communicating with others will improve Outcome: Progressing   Problem: Self-Concept: Goal: Will verbalize positive feelings about self Outcome: Progressing   Problem: Education: Goal: Knowledge of [place] [facility] Education information/materials will improve Outcome: Progressing Goal: Emotional status will improve Outcome: Progressing Goal: Mental status will improve Outcome: Progressing Goal: Verbalization of understanding the information provided will improve Outcome: Progressing   Problem: Safety: Goal: Periods of time without injury will increase Outcome: Progressing   Problem: Education: Goal: Knowledge of General Education information will improve Outcome: Progressing

## 2018-05-24 NOTE — Plan of Care (Signed)
Patient is alert to self, place, disoriented about time. Patient has disorganized thoughts with flight of ideas;  hyper-verbal. Patient is cooperative most of the time, but does have periods of restlessness and irritability. Patient will ask for medication to help with anxiety when needed. Patient continues to say," I think I am about ready to leave this place and go back to the other place." Nurse will continue to monitor. Problem: Education: Goal: Knowledge of [place] [facility] Education information/materials will improve Outcome: Progressing Goal: Emotional status will improve Outcome: Progressing Goal: Mental status will improve Outcome: Progressing Goal: Verbalization of understanding the information provided will improve Outcome: Progressing   Problem: Safety: Goal: Periods of time without injury will increase Outcome: Progressing   Problem: Education: Goal: Knowledge of General Education information will improve Outcome: Progressing

## 2018-05-24 NOTE — [facility] Group Notes (Signed)
[facility] Group Notes:  (Nursing/MHT/Case Management/Adjunct)  Date:  05/24/2018  Time:  10:27 PM  Type of Therapy:  Group Therapy  Participation Level:  Minimal  Participation Quality:  Appropriate  Affect:  Appropriate  Cognitive:  Appropriate  Insight:  Appropriate  Engagement in Group:  Limited  Modes of Intervention:  Support  Summary of Progress/Problems: [name] was in and out of group tonight. Hanish left several times, stating he had to use the bathroom. [name] shared with the group during introduction. MHT addressed the messes that were left behind and encouraged patients to clean up after themselves. MHT reviewed visiting hours and the number of people allowed on the unit at one time. MHT informed patients of the phone hours. MHT informed patients of the time the day room would be shut down. MHT informed patients of wake-up call at 6am for vitals. MHT answered questions and concerns about the rules and expectations. MHT had patients introduce self and tell group what type of animal they would choose to be and why. MHT processed with group about decision making. MHT reviewed list of things to do when making decisions. MHT encouraged group to utilize the social workers while in group to address concerns and develop plans to address issue when released. MHT informed group they could be linked to providers once discharged. MHT informed group that IPRS funds were available for mental health services. MHT informed group they need to recognize a need exists to participate in treatment and medication management. Jinger NeighborsKeith D Jeremyah Jelley 05/24/2018, 10:27 PM

## 2018-05-25 LAB — GLUCOSE, CAPILLARY
GLUCOSE-CAPILLARY: 120 mg/dL — AB (ref 70–99)
GLUCOSE-CAPILLARY: 179 mg/dL — AB (ref 70–99)
Glucose-Capillary: 173 mg/dL — ABNORMAL HIGH (ref 70–99)
Glucose-Capillary: 171 mg/dL — ABNORMAL HIGH (ref 70–99)

## 2018-05-25 NOTE — Progress Notes (Signed)
Patient was not given any meal coverage at dinner because his CBG was 120.

## 2018-05-25 NOTE — Progress Notes (Signed)
Nursing note 7p-7a  Pt observed interacting with peers on unit this shift. Displayed a bright affect and mood upon interaction with this Clinical research associatewriter. Pt denies pain ,denies SI/HI, and also denies any audio or visual hallucinations at this time. Pt c/o itching/bumps on left buttocks. Skin assessed by this RN and Aurther Lofterry RN reddened area wit small bumps noted on left buttocks. Encouraged pt not to scratch cold washcloth compress encouraged. Charge made aware. Pt denies further question or need. Pt is able to verbally contract for safety with this RN. Goal: " to get away from Mr [name] and live on my own" Pt is now resting in bed with eyes closed, with no signs or symptoms of pain or distress noted. Pt continues to remain safe on the unit and is observed by rounding every 15 min. RN will continue to monitor.

## 2018-05-25 NOTE — [facility] Group Notes (Signed)
LCSW Group Therapy Note   05/25/2018 1:15pm   Type of Therapy and Topic:  Group Therapy:  Positive Affirmations   Participation Level:  None  Description of Group: This group addressed positive affirmation toward self and others. Patients went around the room and identified two positive things about themselves and two positive things about a peer in the room. Patients reflected on how it felt to share something positive with others, to identify positive things about themselves, and to hear positive things from others. Patients were encouraged to have a daily reflection of positive characteristics or circumstances.  Therapeutic Goals 1. Patient will verbalize two of their positive qualities 2. Patient will demonstrate empathy for others by stating two positive qualities about a peer in the group 3. Patient will verbalize their feelings when voicing positive self affirmations and when voicing positive affirmations of others 4. Patients will discuss the potential positive impact on their wellness/recovery of focusing on positive traits of self and others. Summary of Patient Progress:  Came initally, stayed about 5 minutes, long enough to make several negative comments, and left.  Did not return.   Therapeutic Modalities Cognitive Behavioral Therapy Motivational Interviewing  Oscar Duke, [place]CS[place] 05/25/2018 2:52 PM

## 2018-05-25 NOTE — Plan of Care (Signed)
Pt continues to progress towards goals and d/c. RN will continue to monitor.  

## 2018-05-25 NOTE — Progress Notes (Signed)
D- Patient alert and oriented. Patient presents in a pleasant mood on assessment stating that he slept "good" last night, however, he woke up at "3am and couldn't go back to sleep". Patient continues to be extremely tangential and preoccupied with whatever comes to his mind. Patient will talk to whomever will listen. Patient continues to have a flight of ideas and preoccupied with "y'all just want to keep somebody drugged up". Patient denies SI, HI, AVH, and pain at this time. Patient also denies any depression/anxiety at this time. Patient's goal for today is to "keep working one day at a time".  A- Scheduled medications administered to patient, per MD orders. Support and encouragement provided.  Routine safety checks conducted every 15 minutes.  Patient informed to notify staff with problems or concerns.  R- No adverse drug reactions noted. Patient contracts for safety at this time. Patient compliant with medications and treatment plan. Patient receptive, calm, and cooperative. Patient interacts well with others on the unit.  Patient remains safe at this time.

## 2018-05-25 NOTE — Progress Notes (Signed)
[facility] Group Notes:  (Nursing/MHT/Case Management/Adjunct)  Date:  05/25/2018  Time:  9:22 PM  Type of Therapy:  Wrap up group  Participation Level:  Active  Participation Quality:  Appropriate  Affect:  Appropriate  Cognitive:  Alert, Appropriate and Oriented  Insight:  Appropriate and Good  Engagement in Group:  Engaged  Modes of Intervention:  Discussion  Summary of Progress/Problems: [name] shared with the group how his day was a 10 because he was able to rest all weekend.  He also shared how he wanted to talk with the manager at the group home prior to returning to discuss his concerns.  MHT provided positive feedback.   Annell GreeningMonroe, Daanish Copes Fortunaasina 05/25/2018, 9:22 PM

## 2018-05-26 DIAGNOSIS — F250 Schizoaffective disorder, bipolar type: Secondary | ICD-10-CM

## 2018-05-26 DIAGNOSIS — E119 Type 2 diabetes mellitus without complications: Secondary | ICD-10-CM

## 2018-05-26 DIAGNOSIS — I10 Essential (primary) hypertension: Secondary | ICD-10-CM

## 2018-05-26 DIAGNOSIS — K5900 Constipation, unspecified: Secondary | ICD-10-CM

## 2018-05-26 DIAGNOSIS — R569 Unspecified convulsions: Secondary | ICD-10-CM

## 2018-05-26 DIAGNOSIS — K219 Gastro-esophageal reflux disease without esophagitis: Secondary | ICD-10-CM

## 2018-05-26 LAB — GLUCOSE, CAPILLARY
GLUCOSE-CAPILLARY: 131 mg/dL — AB (ref 70–99)
Glucose-Capillary: 148 mg/dL — ABNORMAL HIGH (ref 70–99)
Glucose-Capillary: 179 mg/dL — ABNORMAL HIGH (ref 70–99)
Glucose-Capillary: 147 mg/dL — ABNORMAL HIGH (ref 70–99)

## 2018-05-26 LAB — AMMONIA: AMMONIA: 53 umol/L — AB (ref 9–35)

## 2018-05-26 LAB — VALPROIC ACID LEVEL: Valproic Acid Lvl: 101 ug/mL — ABNORMAL HIGH (ref 50.0–100.0)

## 2018-05-26 NOTE — Progress Notes (Signed)
Patient ID: Gari CrownMark A Duke, male   DOB: June 21, 1971, 47 y.o.   MRN: [id number] PER STATE REGULATIONS 482.30  THIS CHART WAS REVIEWED FOR MEDICAL NECESSITY WITH RESPECT TO THE PATIENT'S ADMISSION/ DURATION OF STAY.  NEXT REVIEW DATE: 05/30/2018  Willa RoughJENNIFER JONES Clarise Chacko, RN, BSN CASE MANAGER

## 2018-05-26 NOTE — Progress Notes (Signed)
Recreation Therapy Notes  Date: 05/26/2018  Time: 9:30 am  Location: Craft Room  Behavioral response: Appropriate  Intervention Topic: Stress  Discussion/Intervention:  Group content on today was focused on stress. The group defined stress and ways to cope with stress. Participants expressed how they know when they are stresses out. Individuals described the different ways they have to cope with stress. The group stated reasons why it is important to cope with stress. Patient explained what good stress is and some examples. The group participated in the intervention "Stress Management Jeopardy". Individuals were separated into two group and answered questions related to stress.   Clinical Observations/Feedback:  Patient came to group and defined stress as something you do not want in your life. He was social with peers and staff while participating in the intervention. Yarielys Beed LRT/CTRS         Clemencia Helzer 05/26/2018 12:44 PM

## 2018-05-26 NOTE — [facility] Group Notes (Signed)
[facility] Group Notes:  (Nursing/MHT/Case Management/Adjunct)  Date:  05/26/2018  Time:  9:26 PM  Type of Therapy:  Group Therapy  Participation Level:  Active  Participation Quality:  Appropriate  Affect:  Appropriate  Cognitive:  Appropriate  Insight:  Appropriate  Engagement in Group:  Engaged and Off Topic  Modes of Intervention:  Support  Summary of Progress/Problems:  Alexsis Branscom 05/26/2018, 9:26 PM

## 2018-05-26 NOTE — Tx Team (Signed)
Interdisciplinary Treatment and Diagnostic Plan Update  05/26/2018 Time of Session: 11:00 AM Oscar Duke MRN: [id number]  Principal Diagnosis: Schizoaffective disorder, bipolar type (HCC)  Secondary Diagnoses: Principal Problem:   Schizoaffective disorder, bipolar type (HCC)   Current Medications:  Current Facility-Administered Medications  Medication Dose Route Frequency Provider Last Rate Last Dose  . acetaminophen (TYLENOL) tablet 650 mg  650 mg Oral Q6H PRN Clapacs, Jackquline DenmarkJohn T, MD   650 mg at 05/23/18 1454  . alum & mag hydroxide-simeth (MAALOX/MYLANTA) 200-200-20 MG/5ML suspension 30 mL  30 mL Oral Q4H PRN Clapacs, John T, MD      . amLODipine (NORVASC) tablet 5 mg  5 mg Oral Daily Clapacs, Jackquline DenmarkJohn T, MD   5 mg at 05/26/18 0740  . benztropine (COGENTIN) tablet 1 mg  1 mg Oral BID Clapacs, Jackquline DenmarkJohn T, MD   1 mg at 05/26/18 1634  . clonazePAM (KLONOPIN) tablet 1 mg  1 mg Oral TID [name]'Neal, Sarita, MD   1 mg at 05/26/18 1411  . divalproex (DEPAKOTE) DR tablet 1,000 mg  1,000 mg Oral QHS [name]'Neal, Sarita, MD   1,000 mg at 05/25/18 2154  . divalproex (DEPAKOTE) DR tablet 500 mg  500 mg Oral BID [name]'Neal, Sarita, MD   500 mg at 05/26/18 1411  . guaiFENesin-dextromethorphan (ROBITUSSIN DM) 100-10 MG/5ML syrup 5-10 mL  5-10 mL Oral Q6H PRN [name]'Neal, Sarita, MD      . hydrOXYzine (ATARAX/VISTARIL) tablet 50 mg  50 mg Oral Q4H PRN Clapacs, Jackquline DenmarkJohn T, MD   50 mg at 05/24/18 1147  . ibuprofen (ADVIL,MOTRIN) tablet 400 mg  400 mg Oral Q6H PRN [name]'Neal, Sarita, MD   400 mg at 05/18/18 0855  . insulin aspart (novoLOG) injection 0-15 Units  0-15 Units Subcutaneous TID WC Clapacs, Jackquline DenmarkJohn T, MD   2 Units at 05/26/18 1633  . insulin detemir (LEVEMIR) injection 28 Units  28 Units Subcutaneous Daily Clapacs, Jackquline DenmarkJohn T, MD   28 Units at 05/26/18 [id number]  . lactulose (CHRONULAC) 10 GM/15ML solution 10 g  10 g Oral BID Clapacs, Jackquline DenmarkJohn T, MD   10 g at 05/26/18 1635  . levETIRAcetam (KEPPRA) tablet 1,000 mg  1,000 mg Oral QHS Clapacs, John  T, MD   1,000 mg at 05/25/18 2154  . lisinopril (PRINIVIL,ZESTRIL) tablet 20 mg  20 mg Oral Daily Clapacs, Jackquline DenmarkJohn T, MD   20 mg at 05/26/18 0740  . LORazepam (ATIVAN) tablet 2 mg  2 mg Oral Q4H PRN Pucilowska, Jolanta B, MD   2 mg at 05/24/18 1147   Or  . LORazepam (ATIVAN) injection 2 mg  2 mg Intramuscular Q4H PRN Pucilowska, Jolanta B, MD      . magnesium hydroxide (MILK OF MAGNESIA) suspension 30 mL  30 mL Oral Daily PRN Clapacs, John T, MD      . neomycin-bacitracin-polymyxin (NEOSPORIN) ointment   Topical BID [name]'Neal, Sarita, MD      . OLANZapine (ZYPREXA) tablet 15 mg  15 mg Oral TID PRN Pucilowska, Jolanta B, MD   15 mg at 05/16/18 2039   Or  . OLANZapine (ZYPREXA) injection 10 mg  10 mg Intramuscular TID PRN Pucilowska, Jolanta B, MD      . pantoprazole (PROTONIX) EC tablet 40 mg  40 mg Oral Daily Clapacs, Jackquline DenmarkJohn T, MD   40 mg at 05/26/18 0740  . perphenazine (TRILAFON) tablet 8 mg  8 mg Oral TID Clapacs, Jackquline DenmarkJohn T, MD   8 mg at 05/26/18 1634  . traZODone (DESYREL) tablet 100  mg  100 mg Oral QHS [name], MD   100 mg at 05/25/18 2154   PTA Medications: Medications Prior to Admission  Medication Sig Dispense Refill Last Dose  . amLODipine (NORVASC) 10 MG tablet Take 10 mg by mouth daily.   11/03/2017 at Unknown time  . atorvastatin (LIPITOR) 40 MG tablet Take 40 mg by mouth every evening.   11/02/2017 at Unknown time  . benztropine (COGENTIN) 1 MG tablet Take 1 mg by mouth 2 (two) times daily.    11/03/2017 at Unknown time  . Cholecalciferol (VITAMIN D3) 2000 units TABS Take 2,000 Units by mouth daily.    11/03/2017 at 800a  . clonazePAM (KLONOPIN) 1 MG tablet Take 1 tablet (1 mg total) by mouth 3 (three) times daily as needed for anxiety. (Patient taking differently: Take 1 mg by mouth 3 (three) times daily. )   11/03/2017 at Unknown time  . cyclobenzaprine (FLEXERIL) 10 MG tablet Take 1 tablet (10 mg total) by mouth 3 (three) times daily as needed. 21 tablet 0   . divalproex (DEPAKOTE)  500 MG DR tablet Take 2,000 mg by mouth at bedtime.   11/02/2017 at 2000  . hydrOXYzine (VISTARIL) 50 MG capsule Take 50 mg by mouth every 4 (four) hours as needed.   unknown  . ibuprofen (ADVIL,MOTRIN) 800 MG tablet Take 1 tablet (800 mg total) by mouth every 6 (six) hours as needed. Take with food 21 tablet 0   . insulin detemir (LEVEMIR) 100 UNIT/ML injection Inject 28 Units into the skin daily.   11/03/2017 at Unknown time  . Lactulose 20 GM/30ML SOLN Take 30 mLs by mouth daily.   11/03/2017 at Unknown time  . levETIRAcetam (KEPPRA) 1000 MG tablet Take 1,000 mg by mouth at bedtime.   11/02/2017 at 2000  . lisinopril (PRINIVIL,ZESTRIL) 20 MG tablet Take 20 mg by mouth daily.   11/03/2017 at Unknown time  . omeprazole (PRILOSEC) 20 MG capsule Take 20 mg by mouth daily.   11/03/2017 at Unknown time  . perphenazine (TRILAFON) 8 MG tablet Take 4-8 mg by mouth 3 (three) times daily. 4mg  twice daily and 8mg  tab at bedtime   11/03/2017 at 800a  . propranolol (INDERAL) 10 MG tablet Take 10 mg by mouth 2 (two) times daily.   11/03/2017 at 800a  . sodium chloride 1 g tablet Take 1 g by mouth daily.   11/03/2017 at Unknown time  . traZODone (DESYREL) 150 MG tablet Take 300 mg by mouth at bedtime.    11/02/2017 at Unknown time    Patient Stressors: Financial difficulties Health problems  Patient Strengths: Active sense of humor Motivation for treatment/growth  Treatment Modalities: Medication Management, Group therapy, Case management,  1 to 1 session with clinician, Psychoeducation, Recreational therapy.   Physician Treatment Plan for Primary Diagnosis: Schizoaffective disorder, bipolar type (HCC) Long Term Goal(s): Improvement in symptoms so as ready for discharge Improvement in symptoms so as ready for discharge   Short Term Goals: Ability to verbalize feelings will improve Ability to disclose and discuss suicidal ideas Compliance with prescribed medications will improve Ability to disclose  and discuss suicidal ideas Ability to identify and develop effective coping behaviors will improve Compliance with prescribed medications will improve Ability to identify triggers associated with substance abuse/mental health issues will improve  Medication Management: Evaluate patient's response, side effects, and tolerance of medication regimen.  Therapeutic Interventions: 1 to 1 sessions, Unit Group sessions and Medication administration.  Evaluation of Outcomes: Progressing  Physician Treatment  Plan for Secondary Diagnosis: Principal Problem:   Schizoaffective disorder, bipolar type (HCC)  Long Term Goal(s): Improvement in symptoms so as ready for discharge Improvement in symptoms so as ready for discharge   Short Term Goals: Ability to verbalize feelings will improve Ability to disclose and discuss suicidal ideas Compliance with prescribed medications will improve Ability to disclose and discuss suicidal ideas Ability to identify and develop effective coping behaviors will improve Compliance with prescribed medications will improve Ability to identify triggers associated with substance abuse/mental health issues will improve     Medication Management: Evaluate patient's response, side effects, and tolerance of medication regimen.  Therapeutic Interventions: 1 to 1 sessions, Unit Group sessions and Medication administration.  Evaluation of Outcomes: Progressing   RN Treatment Plan for Primary Diagnosis: Schizoaffective disorder, bipolar type (HCC) Long Term Goal(s): Knowledge of disease and therapeutic regimen to maintain health will improve  Short Term Goals: Ability to demonstrate self-control, Ability to participate in decision making will improve, Ability to identify and develop effective coping behaviors will improve and Compliance with prescribed medications will improve  Medication Management: RN will administer medications as ordered by provider, will assess and  evaluate patient's response and provide education to patient for prescribed medication. RN will report any adverse and/or side effects to prescribing provider.  Therapeutic Interventions: 1 on 1 counseling sessions, Psychoeducation, Medication administration, Evaluate responses to treatment, Monitor vital signs and CBGs as ordered, Perform/monitor CIWA, COWS, AIMS and Fall Risk screenings as ordered, Perform wound care treatments as ordered.  Evaluation of Outcomes: Progressing   LCSW Treatment Plan for Primary Diagnosis: Schizoaffective disorder, bipolar type (HCC) Long Term Goal(s): Safe transition to appropriate next level of care at discharge, Engage patient in therapeutic group addressing interpersonal concerns.  Short Term Goals: Engage patient in aftercare planning with referrals and resources and Increase skills for wellness and recovery  Therapeutic Interventions: Assess for all discharge needs, 1 to 1 time with Social worker, Explore available resources and support systems, Assess for adequacy in community support network, Educate family and significant other(s) on suicide prevention, Complete Psychosocial Assessment, Interpersonal group therapy.  Evaluation of Outcomes: Progressing   Progress in Treatment: Attending groups: Yes. Participating in groups: No. Taking medication as prescribed: Yes. Toleration medication: Yes. Family/Significant other contact made: Yes, individual(s) contacted:  Pt's HCPOA/cousin, [name], has been contacted. Patient understands diagnosis: No. Discussing patient identified problems/goals with staff: Yes. Medical problems stabilized or resolved: Yes. Denies suicidal/homicidal ideation: Yes. Issues/concerns per patient self-inventory: No. Other: n/a  New problem(s) identified: No, Describe:  No new problems identified  New Short Term/Long Term Goal(s):  Patient Goals:  Pt refused to come to treatment team  Discharge Plan or Barriers: Tentative  discharge plan is for pt to return to his group home and resume ACT services with [facility].  Reason for Continuation of Hospitalization: Mania Medication stabilization  Estimated Length of Stay: 3-5 days   Recreational Therapy: Patient Stressors: N/A  Patient Goal: Patient will engage in groups with a calm and appropriate mood at least 2x within 5 recreation therapy group sessions  Attendees: Patient: 05/26/2018 4:36 PM  Physician: Donita Brooks, MD 05/26/2018 4:36 PM  Nursing: Hulan Amato, RN 05/26/2018 4:36 PM  RN Care Manager: 05/26/2018 4:36 PM  Social Worker: Jake Shark, LCSW 05/26/2018 4:36 PM  Recreational Therapist: Garret Reddish, LRT 05/26/2018 4:36 PM  Other: Heidi Dach, LCSW 05/26/2018 4:36 PM  Other: 05/26/2018 4:36 PM  Other:  05/26/2018 4:36 PM    Scribe for Treatment  Team: Glennon Mac, LCSW 05/26/2018 4:36 PM

## 2018-05-26 NOTE — Progress Notes (Signed)
[facility] MD Progress Note  05/26/2018 1:49 PM Oscar Duke  MRN:  [id number]  Subjective:  Patient reports that he had a good weekend.  He reports that he is eating and sleeping well.  He is hopeful to get discharge soon.  Of note, his prior group home did not come see him this past Friday as scheduled.  Social worker is looking into this.  As soon as a safe disposition can be confirmed, the patient may be discharged.  Principal Problem: Schizoaffective disorder, bipolar type (HCC) Diagnosis:   Patient Active Problem List   Diagnosis Date Noted  . Schizoaffective disorder, bipolar type (HCC) [F25.0] 05/14/2018  . Somnolence [R40.0] 10/05/2017  . Adverse drug interaction with prescription medication [T50.905A] 10/05/2017  . Hypertension [I10]   . Diabetes mellitus without complication (HCC) [E11.9]   . Bipolar 1 disorder (HCC) [F31.9]   . Seizures (HCC) [R56.9]   . Schizophrenia, acute (HCC) [F23] 05/14/2017   Total Time spent with patient: 15 minutes  Past Psychiatric History: schizoaffective disorder  Past Medical History:  Past Medical History:  Diagnosis Date  . Bipolar 1 disorder (HCC)   . Diabetes mellitus without complication (HCC)   . Hyperlipidemia   . Hypertension   . 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 Schizophrenia, acute (HCC)   . Seizures (HCC)     Past Surgical History:  Procedure Laterality Date  . JOINT REPLACEMENT     Family History: History reviewed. No pertinent family history. Family Psychiatric  History: none Social History:  Social History   Substance and Sexual Activity  Alcohol Use No     Social History   Substance and Sexual Activity  Drug Use Yes  . Types: Marijuana   Comment: quit 5 years ago    Social History   Socioeconomic History  . Marital status: Single    Spouse name: Not on file  . Number of children: Not on file  . Years of education: Not on file  . Highest education level: Not on file  Occupational  History  . Not on file  Social Needs  . Financial resource strain: Not on file  . Food insecurity:    Worry: Not on file    Inability: Not on file  . Transportation needs:    Medical: Not on file    Non-medical: Not on file  Tobacco Use  . Smoking status: Never Smoker  . Smokeless tobacco: Never Used  Substance and Sexual Activity  . Alcohol use: No  . Drug use: Yes    Types: Marijuana    Comment: quit 5 years ago  . Sexual activity: Not on file  Lifestyle  . Physical activity:    Days per week: Not on file    Minutes per session: Not on file  . Stress: Not on file  Relationships  . Social connections:    Talks on phone: Not on file    Gets together: Not on file    Attends religious service: Not on file    Active member of club or organization: Not on file    Attends meetings of clubs or organizations: Not on file    Relationship status: Not on file  Other Topics Concern  . Not on file  Social History Narrative  . Not on file   Additional Social History:    Sleep: Fair  Appetite:  Fair  Current Medications: Current Facility-Administered Medications  Medication Dose Route Frequency Provider Last Rate Last Dose  .  acetaminophen (TYLENOL) tablet 650 mg  650 mg Oral Q6H PRN Clapacs, Jackquline Denmark, MD   650 mg at 05/23/18 1454  . alum & mag hydroxide-simeth (MAALOX/MYLANTA) 200-200-20 MG/5ML suspension 30 mL  30 mL Oral Q4H PRN Clapacs, John T, MD      . amLODipine (NORVASC) tablet 5 mg  5 mg Oral Daily Clapacs, Jackquline Denmark, MD   5 mg at 05/26/18 0740  . benztropine (COGENTIN) tablet 1 mg  1 mg Oral BID Clapacs, Jackquline Denmark, MD   1 mg at 05/26/18 0740  . clonazePAM (KLONOPIN) tablet 1 mg  1 mg Oral TID [name], MD   1 mg at 05/26/18 0740  . divalproex (DEPAKOTE) DR tablet 1,000 mg  1,000 mg Oral QHS [name], MD   1,000 mg at 05/25/18 2154  . divalproex (DEPAKOTE) DR tablet 500 mg  500 mg Oral BID [name], MD   500 mg at 05/26/18 0624  . guaiFENesin-dextromethorphan  (ROBITUSSIN DM) 100-10 MG/5ML syrup 5-10 mL  5-10 mL Oral Q6H PRN [name], MD      . hydrOXYzine (ATARAX/VISTARIL) tablet 50 mg  50 mg Oral Q4H PRN Clapacs, Jackquline Denmark, MD   50 mg at 05/24/18 1147  . ibuprofen (ADVIL,MOTRIN) tablet 400 mg  400 mg Oral Q6H PRN [name], MD   400 mg at 05/18/18 0855  . insulin aspart (novoLOG) injection 0-15 Units  0-15 Units Subcutaneous TID WC Clapacs, Jackquline Denmark, MD   3 Units at 05/26/18 1159  . insulin detemir (LEVEMIR) injection 28 Units  28 Units Subcutaneous Daily Clapacs, Jackquline Denmark, MD   28 Units at 05/26/18 [phone]  . lactulose (CHRONULAC) 10 GM/15ML solution 10 g  10 g Oral BID Clapacs, Jackquline Denmark, MD   10 g at 05/26/18 0739  . levETIRAcetam (KEPPRA) tablet 1,000 mg  1,000 mg Oral QHS Clapacs, John T, MD   1,000 mg at 05/25/18 2154  . lisinopril (PRINIVIL,ZESTRIL) tablet 20 mg  20 mg Oral Daily Clapacs, Jackquline Denmark, MD   20 mg at 05/26/18 0740  . LORazepam (ATIVAN) tablet 2 mg  2 mg Oral Q4H PRN Pucilowska, Jolanta B, MD   2 mg at 05/24/18 1147   Or  . LORazepam (ATIVAN) injection 2 mg  2 mg Intramuscular Q4H PRN Pucilowska, Jolanta B, MD      . magnesium hydroxide (MILK OF MAGNESIA) suspension 30 mL  30 mL Oral Daily PRN Clapacs, John T, MD      . neomycin-bacitracin-polymyxin (NEOSPORIN) ointment   Topical BID [name], MD      . OLANZapine (ZYPREXA) tablet 15 mg  15 mg Oral TID PRN Pucilowska, Jolanta B, MD   15 mg at 05/16/18 2039   Or  . OLANZapine (ZYPREXA) injection 10 mg  10 mg Intramuscular TID PRN Pucilowska, Jolanta B, MD      . pantoprazole (PROTONIX) EC tablet 40 mg  40 mg Oral Daily Clapacs, Jackquline Denmark, MD   40 mg at 05/26/18 0740  . perphenazine (TRILAFON) tablet 8 mg  8 mg Oral TID Clapacs, Jackquline Denmark, MD   8 mg at 05/26/18 1159  . traZODone (DESYREL) tablet 100 mg  100 mg Oral QHS [name], MD   100 mg at 05/25/18 2154    Lab Results:  Results for orders placed or performed during the hospital encounter of 05/14/18 (from the past 48 hour(s))   Glucose, capillary     Status: Abnormal   Collection Time: 05/24/18  4:26 PM  Result Value Ref Range   Glucose-Capillary 124 (H) 70 - 99 mg/dL   Comment 1 Notify RN   Glucose, capillary     Status: Abnormal   Collection Time: 05/24/18  8:37 PM  Result Value Ref Range   Glucose-Capillary 117 (H) 70 - 99 mg/dL   Comment 1 Notify RN   Glucose, capillary     Status: Abnormal   Collection Time: 05/25/18  7:14 AM  Result Value Ref Range   Glucose-Capillary 173 (H) 70 - 99 mg/dL   Comment 1 Notify RN   Glucose, capillary     Status: Abnormal   Collection Time: 05/25/18 11:28 AM  Result Value Ref Range   Glucose-Capillary 171 (H) 70 - 99 mg/dL   Comment 1 Notify RN   Glucose, capillary     Status: Abnormal   Collection Time: 05/25/18  4:24 PM  Result Value Ref Range   Glucose-Capillary 120 (H) 70 - 99 mg/dL   Comment 1 Notify RN   Glucose, capillary     Status: Abnormal   Collection Time: 05/25/18 10:04 PM  Result Value Ref Range   Glucose-Capillary 179 (H) 70 - 99 mg/dL  Valproic acid level     Status: Abnormal   Collection Time: 05/26/18  6:28 AM  Result Value Ref Range   Valproic Acid Lvl 101 (H) 50.0 - 100.0 ug/mL    Comment: Performed at [facility], [address] [place], [place] [address]  Ammonia     Status: Abnormal   Collection Time: 05/26/18  6:28 AM  Result Value Ref Range   Ammonia 53 (H) 9 - 35 umol/L    Comment: Performed at [facility], [address] [place], [place] [address]  Glucose, capillary     Status: Abnormal   Collection Time: 05/26/18  7:04 AM  Result Value Ref Range   Glucose-Capillary 148 (H) 70 - 99 mg/dL   Comment 1 Notify RN   Glucose, capillary     Status: Abnormal   Collection Time: 05/26/18 11:31 AM  Result Value Ref Range   Glucose-Capillary 179 (H) 70 - 99 mg/dL   Comment 1 Notify RN     Blood Alcohol level:  Lab Results  Component Value Date   ETH <10 05/13/2018   ETH <5 04/13/2017    Metabolic Disorder  Labs: Lab Results  Component Value Date   HGBA1C 5.6 05/15/2018   MPG 114.02 05/15/2018   No results found for: PROLACTIN Lab Results  Component Value Date   CHOL 93 05/15/2018   TRIG 61 05/15/2018   HDL 45 05/15/2018   CHOLHDL 2.1 05/15/2018   VLDL 12 05/15/2018   LDLCALC 36 05/15/2018    Physical Findings: AIMS: Facial and Oral Movements Muscles of Facial Expression: None, normal Lips and Perioral Area: None, normal Jaw: None, normal Tongue: None, normal,Extremity Movements Upper (arms, wrists, hands, fingers): None, normal Lower (legs, knees, ankles, toes): None, normal, Trunk Movements Neck, shoulders, hips: None, normal, Overall Severity Severity of abnormal movements (highest score from questions above): None, normal Incapacitation due to abnormal movements: None, normal Patient's awareness of abnormal movements (rate only patient's report): No Awareness, Dental Status Current problems with teeth and/or dentures?: No Does patient usually wear dentures?: No  CIWA:    COWS:     Musculoskeletal: Strength & Muscle Tone: within normal limits Gait & Station: normal Patient leans: N/A  Psychiatric Specialty Exam: Physical Exam  Nursing note and vitals reviewed. Psychiatric: He has a normal mood  and affect. His speech is normal and behavior is normal. Thought content normal. Cognition and memory are normal. He expresses impulsivity.    Review of Systems  Neurological: Negative.   Psychiatric/Behavioral: Negative.   All other systems reviewed and are negative.   Blood pressure 109/79, pulse 97, temperature 97.7 F (36.5 C), temperature source Oral, resp. rate 18, height 6\' 4"  (1.93 m), weight 110.7 kg (244 lb), SpO2 100 %.Body mass index is 29.7 kg/m.  General Appearance: Casual  Eye Contact:  Good  Speech:  Clear and Coherent  Volume:  Normal; however verbose  Mood:  Euthymic  Affect:  Appropriate  Thought Process:  tangential  Orientation:  Full (Time, Place,  and Person)  Thought Content:  WDL  Suicidal Thoughts:  pt denies  Homicidal Thoughts:  pt denies  Memory:  grossly intact  Judgement:  Poor  Insight:  Lacking  Psychomotor Activity:  Normal  Concentration:  Concentration: Poor and Attention Span: Poor  Recall:  Poor  Fund of Knowledge:  Fair  Language:  Fair  Akathisia:  No  Handed:  Right  AIMS (if indicated):     Assets:  Communication Skills Desire for Improvement Financial Resources/Insurance Physical Health Resilience Social Support  ADL's:  Intact  Cognition:  WNL  Sleep:  Number of Hours: 6.5     Treatment Plan Summary: Daily contact with patient to assess and evaluate symptoms and progress in treatment and Medication management   Mr. Mahnke is a 47 year old male with a history of schizoaffective disorder admitted for psychotic behaviors, agitation and disorganization who has improved and is awaiting placement.   Plan: Schizoaffective disorder, bipolar type -perphenazine 8 mg TID -valproic acid level 101 on total daily dose of 2000 mg of depakote. Also, ammonia level elevated at 53.  LFTs wnl.  -klonopin 1 mg TID (0800, 1400, 2200) -cogentin 1 mg BID for EPS prophylaxis -trazodone 100 mg qhs (do not increase trazodone above 100 mg nightly--it was too activating for the patient at higher doses).     Disposition:  -Group home did not come Friday 7/12 as scheduled.     [name], MD 05/26/2018, 1:49 PM

## 2018-05-26 NOTE — Progress Notes (Signed)
D: Patient stated slept good last night .Stated appetite is good and energy level  Is normal. Stated concentration is good . Stated on Depression 0 scale , hopeless 0 and anxiety 0 .( low 0-1 0 high) Denies suicidal  homicidal ideations  .  No auditory hallucinations  No pain concerns . Appropriate ADL'S. Interacting with peers and staff. Patient  Has periods lability , irritability through out shift . A: Encourage patient participation with unit programming . Instruction  Given on  Medication , verbalize understanding. R: Voice no other concerns. Staff continue to monitor

## 2018-05-26 NOTE — Progress Notes (Signed)
Nursing note 7p-7a  Pt observed interacting with peers on unit this shift. Displayed a bright affect and pleasant mood upon interaction with this Clinical research associatewriter. Pt denies pain ,denies SI/HI, and also denies any audio or visual hallucinations at this time. Pt is able to verbally contract for safety with this RN. Pt is now resting in bed with eyes closed, with no signs or symptoms of pain or distress noted. Pt continues to remain safe on the unit and is observed by rounding every 15 min. RN will continue to monitor.

## 2018-05-26 NOTE — Plan of Care (Signed)
Staff continue to educate patient in a way that he understands . Voice of no safety concerns  working on coping skills  Limited interactions with peers . Able to maintain eye contact.Emotional and mental status  improved  Problem: Coping: Goal: Ability to identify and develop effective coping behavior will improve Outcome: Progressing Goal: Ability to interact with others will improve Outcome: Progressing Goal: Demonstration of participation in decision-making regarding own care will improve Outcome: Progressing Goal: Ability to use eye contact when communicating with others will improve Outcome: Progressing   Problem: Self-Concept: Goal: Will verbalize positive feelings about self Outcome: Progressing   Problem: Education: Goal: Knowledge of [place] [facility] Education information/materials will improve Outcome: Progressing Goal: Emotional status will improve Outcome: Progressing Goal: Mental status will improve Outcome: Progressing Goal: Verbalization of understanding the information provided will improve Outcome: Progressing   Problem: Safety: Goal: Periods of time without injury will increase Outcome: Progressing   Problem: Education: Goal: Knowledge of General Education information will improve Outcome: Progressing

## 2018-05-27 LAB — GLUCOSE, CAPILLARY
GLUCOSE-CAPILLARY: 185 mg/dL — AB (ref 70–99)
GLUCOSE-CAPILLARY: 177 mg/dL — AB (ref 70–99)
Glucose-Capillary: 211 mg/dL — ABNORMAL HIGH (ref 70–99)
Glucose-Capillary: 153 mg/dL — ABNORMAL HIGH (ref 70–99)

## 2018-05-27 NOTE — [facility] Group Notes (Signed)
05/27/2018 1PM  Type of Therapy/Topic:  Group Therapy:  Feelings about Diagnosis  Participation Level:  Active   Description of Group:   This group will allow patients to explore their thoughts and feelings about diagnoses they have received. Patients will be guided to explore their level of understanding and acceptance of these diagnoses. Facilitator will encourage patients to process their thoughts and feelings about the reactions of others to their diagnosis and will guide patients in identifying ways to discuss their diagnosis with significant others in their lives. This group will be process-oriented, with patients participating in exploration of their own experiences, giving and receiving support, and processing challenge from other group members.   Therapeutic Goals: 1. Patient will demonstrate understanding of diagnosis as evidenced by identifying two or more symptoms of the disorder 2. Patient will be able to express two feelings regarding the diagnosis 3. Patient will demonstrate their ability to communicate their needs through discussion and/or role play  Summary of Patient Progress:  Patient was able to provide support and validation to other group members.Patient practiced active listening when interacting with the facilitator and other group members. At times the patient would get off topic but was able to redirect by the facilitator. He reports having symptoms of paranoia and reports "I don't accept the diagnosis." Patient is still in the process of obtaining treatment goals.        Therapeutic Modalities:   Cognitive Behavioral Therapy Brief Therapy Feelings Identification    Oscar ShearsCassandra  Dalayna Lauter, Alexander MtLCSW 05/27/2018 2:37 PM

## 2018-05-27 NOTE — Plan of Care (Addendum)
Patient found in common area upon my arrival. Patient is visible and social this evening. Patient is much more appropriate. Mood and affect continue to improve. Processing improving. Less intrusive and tangential. Denies SI/HI/AVH. Denies pain. Reports eating and voiding adequately. Given Vistaril and Trazodone for sleep with positive results. CBG 153. No coverage ordered @HS . Compliant with HS medications and staff direction. Q 15 minute checks maintained. Will continue to monitor throughout the shift. @0530 , patient complains of headache 5/10, given Motrin. Will monitor for efficacy. VSS. Compliant with am Depakote. Patient slept 7.5 hours. No apparent distress. CBG 137, given 2 units coverage and 28 units Levemir. Will endorse care to oncoming shift.  Problem: Coping: Goal: Ability to identify and develop effective coping behavior will improve Outcome: Progressing Goal: Ability to interact with others will improve Outcome: Progressing Goal: Ability to use eye contact when communicating with others will improve Outcome: Progressing   Problem: Self-Concept: Goal: Will verbalize positive feelings about self Outcome: Progressing   Problem: Education: Goal: Emotional status will improve Outcome: Progressing Goal: Mental status will improve Outcome: Progressing   Problem: Safety: Goal: Periods of time without injury will increase Outcome: Progressing

## 2018-05-27 NOTE — [facility] Group Notes (Signed)
CSW Group Therapy Note  05/27/2018  Time:  0900  Type of Therapy and Topic: Group Therapy: Goals Group: SMART Goals    Participation Level:  Active    Description of Group:   The purpose of a daily goals group is to assist and guide patients in setting recovery/wellness-related goals. The objective is to set goals as they relate to the crisis in which they were admitted. Patients will be using SMART goal modalities to set measurable goals. Characteristics of realistic goals will be discussed and patients will be assisted in setting and processing how one will reach their goal. Facilitator will also assist patients in applying interventions and coping skills learned in psycho-education groups to the SMART goal and process how one will achieve defined goal.    Therapeutic Goals:  -Patients will develop and document one goal related to or their crisis in which brought them into treatment.  -Patients will be guided by LCSW using SMART goal setting modality in how to set a measurable, attainable, realistic and time sensitive goal.  -Patients will process barriers in reaching goal.  -Patients will process interventions in how to overcome and successful in reaching goal.    Patient's Goal:   Pt continues to work towards their tx goals but has not yet reached them. Pt was able to appropriately participate in group discussion, and was able to offer support/validation to other group members. Pt reported his goal is, "to be discharged by speaking with the doctor at least once by the end of the day."   Therapeutic Modalities:  Motivational Interviewing  Cognitive Behavioral Therapy  Crisis Intervention Model  SMART goals setting  Heidi DachKelsey Rosellen Lichtenberger, MSW, LCSW Clinical Social Worker 05/27/2018 9:29 AM

## 2018-05-27 NOTE — Progress Notes (Signed)
D: Patient stated slept good last night .Stated appetite is good and energy level  Is normal. Stated concentration is good . Stated on Depression scale , hopeless and anxiety .( low 0-10 high) Denies suicidal  homicidal ideations  .Auditory hallucinations  No pain concerns . Appropriate ADL'S. Interacting with peers and staff.  Patient waiting on process for  Interview with staff from another  facility  Voice of no safety concerns  working on coping skills . Limited interactions with peers . Able to maintain eye contact. Emotional and mental status improves. Staff continue to educate patient in a way that he understands .  Periods of emotional outburst ,but able to controlled  A: Encourage patient participation with unit programming . Instruction  Given on  Medication , verbalize understanding.  R: Voice no other concerns. Staff continue to monitor

## 2018-05-27 NOTE — Progress Notes (Signed)
D:Patient able to visit with  Staff member from a group home  For interview .  A:Patient was accepted  To group home   Following which  Patient became very angry calling names to staff  And cursing  Staff. Patient  Received Ativan 2mg  po  R:Able to return to dayroom  Following  His taking medication

## 2018-05-27 NOTE — [facility] Suicide Risk Assessment (Signed)
[facility] INPATIENT:  Family/Significant Other Suicide Prevention Education  Suicide Prevention Education:  Education Completed; Nelle DonMichael Herring, HPOA ,  (name of family member/significant other) has been identified by the patient as the family member/significant other with whom the patient will be residing, and identified as the person(s) who will aid the patient in the event of a mental health crisis (suicidal ideations/suicide attempt).  With written consent from the patient, the family member/significant other has been provided the following suicide prevention education, prior to the and/or following the discharge of the patient.  The suicide prevention education provided includes the following:  Suicide risk factors  Suicide prevention and interventions  National Suicide Hotline telephone number  [facility] assessment telephone number  [facility] Emergency Assistance 911  [facility] and/or [facility]idential Mobile Crisis Unit telephone number  Request made of family/significant other to:  Remove weapons (e.g., guns, rifles, knives), all items previously/currently identified as safety concern.    Remove drugs/medications (over-the-counter, prescriptions, illicit drugs), all items previously/currently identified as a safety concern.  The family member/significant other verbalizes understanding of the suicide prevention education information provided.  The family member/significant other agrees to remove the items of safety concern listed above.  Cleda DaubSara P Eason Housman, LCSW 05/27/2018, 3:09 PM

## 2018-05-27 NOTE — Progress Notes (Addendum)
[facility]  05/27/2018 1:01 PM Oscar Duke  MRN:  [id number]  Subjective:  Patient continues to report that he is doing well.  He reports readiness for discharge.  We called his group home manager, Oscar Duke [phone], [facility].  Ms. Rosalia HammersRay will come see the patient after 3:30 PM today to see if she will accept him back into her facility.  Patient reports stable mood.  He is getting along well with staff and peers.  He is compliant with his medications.  As soon as a safe disposition can be confirmed, the patient may be discharged.  Principal Problem: Schizoaffective disorder, bipolar type (HCC) Diagnosis:   Patient Active Problem List   Diagnosis Date Noted  . Schizoaffective disorder, bipolar type (HCC) [F25.0] 05/14/2018  . Somnolence [R40.0] 10/05/2017  . Adverse drug interaction with prescription medication [T50.905A] 10/05/2017  . Hypertension [I10]   . Diabetes mellitus without complication (HCC) [E11.9]   . Bipolar 1 disorder (HCC) [F31.9]   . Seizures (HCC) [R56.9]   . Schizophrenia, acute (HCC) [F23] 05/14/2017   Total Time spent with patient, reviewing his chart, calling his healthcare power of attorney, talking with the manager at his group home, and discussing patient with his treatment team: 45 minutes  Past Psychiatric History: schizoaffective disorder  Past Medical History:  Past Medical History:  Diagnosis Date  . Bipolar 1 disorder (HCC)   . Diabetes mellitus without complication (HCC)   . Hyperlipidemia   . Hypertension   . 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 Schizophrenia, acute (HCC)   . Seizures (HCC)     Past Surgical History:  Procedure Laterality Date  . JOINT REPLACEMENT     Family History: History reviewed. No pertinent family history. Family Psychiatric  History: none Social History:  Social History   Substance and Sexual Activity  Alcohol Use No     Social History   Substance and  Sexual Activity  Drug Use Yes  . Types: Marijuana   Comment: quit 5 years ago    Social History   Socioeconomic History  . Marital status: Single    Spouse name: Not on file  . Number of children: Not on file  . Years of education: Not on file  . Highest education level: Not on file  Occupational History  . Not on file  Social Needs  . Financial resource strain: Not on file  . Food insecurity:    Worry: Not on file    Inability: Not on file  . Transportation needs:    Medical: Not on file    Non-medical: Not on file  Tobacco Use  . Smoking status: Never Smoker  . Smokeless tobacco: Never Used  Substance and Sexual Activity  . Alcohol use: No  . Drug use: Yes    Types: Marijuana    Comment: quit 5 years ago  . Sexual activity: Not on file  Lifestyle  . Physical activity:    Days per week: Not on file    Minutes per session: Not on file  . Stress: Not on file  Relationships  . Social connections:    Talks on phone: Not on file    Gets together: Not on file    Attends religious service: Not on file    Active member of club or organization: Not on file    Attends meetings of clubs or organizations: Not on file    Relationship status: Not on file  Other Topics Concern  . Not on file  Social History Narrative  . Not on file   Additional Social History:    Sleep: Good  Appetite:  Good  Current Medications: Current Facility-Administered Medications  Medication Dose Route Frequency Provider Last Rate Last Dose  . acetaminophen (TYLENOL) tablet 650 mg  650 mg Oral Q6H PRN Clapacs, Jackquline Denmark, MD   650 mg at 05/27/18 0511  . alum & mag hydroxide-simeth (MAALOX/MYLANTA) 200-200-20 MG/5ML suspension 30 mL  30 mL Oral Q4H PRN Clapacs, John T, MD      . amLODipine (NORVASC) tablet 5 mg  5 mg Oral Daily Clapacs, Jackquline Denmark, MD   5 mg at 05/27/18 0802  . benztropine (COGENTIN) tablet 1 mg  1 mg Oral BID Clapacs, Jackquline Denmark, MD   1 mg at 05/27/18 0802  . clonazePAM (KLONOPIN) tablet  1 mg  1 mg Oral TID [name], MD   1 mg at 05/27/18 0802  . divalproex (DEPAKOTE) DR tablet 1,000 mg  1,000 mg Oral QHS [name], MD   1,000 mg at 05/26/18 2200  . divalproex (DEPAKOTE) DR tablet 500 mg  500 mg Oral BID [name], MD   500 mg at 05/27/18 0512  . guaiFENesin-dextromethorphan (ROBITUSSIN DM) 100-10 MG/5ML syrup 5-10 mL  5-10 mL Oral Q6H PRN [name], MD      . hydrOXYzine (ATARAX/VISTARIL) tablet 50 mg  50 mg Oral Q4H PRN Clapacs, Jackquline Denmark, MD   50 mg at 05/24/18 1147  . ibuprofen (ADVIL,MOTRIN) tablet 400 mg  400 mg Oral Q6H PRN [name], MD   400 mg at 05/18/18 0855  . insulin aspart (novoLOG) injection 0-15 Units  0-15 Units Subcutaneous TID WC Clapacs, Jackquline Denmark, MD   5 Units at 05/27/18 1200  . insulin detemir (LEVEMIR) injection 28 Units  28 Units Subcutaneous Daily Clapacs, Jackquline Denmark, MD   28 Units at 05/27/18 0801  . lactulose (CHRONULAC) 10 GM/15ML solution 10 g  10 g Oral BID Clapacs, Jackquline Denmark, MD   10 g at 05/27/18 0801  . levETIRAcetam (KEPPRA) tablet 1,000 mg  1,000 mg Oral QHS Clapacs, John T, MD   1,000 mg at 05/26/18 2200  . lisinopril (PRINIVIL,ZESTRIL) tablet 20 mg  20 mg Oral Daily Clapacs, Jackquline Denmark, MD   20 mg at 05/27/18 0802  . LORazepam (ATIVAN) tablet 2 mg  2 mg Oral Q4H PRN Pucilowska, Jolanta B, MD   2 mg at 05/24/18 1147   Or  . LORazepam (ATIVAN) injection 2 mg  2 mg Intramuscular Q4H PRN Pucilowska, Jolanta B, MD      . magnesium hydroxide (MILK OF MAGNESIA) suspension 30 mL  30 mL Oral Daily PRN Clapacs, John T, MD      . neomycin-bacitracin-polymyxin (NEOSPORIN) ointment   Topical BID [name], MD      . OLANZapine (ZYPREXA) tablet 15 mg  15 mg Oral TID PRN Pucilowska, Jolanta B, MD   15 mg at 05/16/18 2039   Or  . OLANZapine (ZYPREXA) injection 10 mg  10 mg Intramuscular TID PRN Pucilowska, Jolanta B, MD      . pantoprazole (PROTONIX) EC tablet 40 mg  40 mg Oral Daily Clapacs, Jackquline Denmark, MD   40 mg at 05/27/18 0802  . perphenazine  (TRILAFON) tablet 8 mg  8 mg Oral TID Clapacs, Jackquline Denmark, MD   8 mg at 05/27/18 1159  . traZODone (DESYREL) tablet 100 mg  100 mg Oral QHS O'Neal, Cerise Lieber,  MD   100 mg at 05/26/18 2200    Lab Results:  Results for orders placed or performed during the hospital encounter of 05/14/18 (from the past 48 hour(s))  Glucose, capillary     Status: Abnormal   Collection Time: 05/25/18  4:24 PM  Result Value Ref Range   Glucose-Capillary 120 (H) 70 - 99 mg/dL   Comment 1 Notify RN   Glucose, capillary     Status: Abnormal   Collection Time: 05/25/18 10:04 PM  Result Value Ref Range   Glucose-Capillary 179 (H) 70 - 99 mg/dL  Valproic acid level     Status: Abnormal   Collection Time: 05/26/18  6:28 AM  Result Value Ref Range   Valproic Acid Lvl 101 (H) 50.0 - 100.0 ug/mL    Comment: Performed at [facility], [address] [place], [place] [address]  Ammonia     Status: Abnormal   Collection Time: 05/26/18  6:28 AM  Result Value Ref Range   Ammonia 53 (H) 9 - 35 umol/L    Comment: Performed at [facility], [address] [place], [place] [address]  Glucose, capillary     Status: Abnormal   Collection Time: 05/26/18  7:04 AM  Result Value Ref Range   Glucose-Capillary 148 (H) 70 - 99 mg/dL   Comment 1 Notify RN   Glucose, capillary     Status: Abnormal   Collection Time: 05/26/18 11:31 AM  Result Value Ref Range   Glucose-Capillary 179 (H) 70 - 99 mg/dL   Comment 1 Notify RN   Glucose, capillary     Status: Abnormal   Collection Time: 05/26/18  4:11 PM  Result Value Ref Range   Glucose-Capillary 147 (H) 70 - 99 mg/dL   Comment 1 Notify RN   Glucose, capillary     Status: Abnormal   Collection Time: 05/26/18  9:01 PM  Result Value Ref Range   Glucose-Capillary 131 (H) 70 - 99 mg/dL   Comment 1 Notify RN   Glucose, capillary     Status: Abnormal   Collection Time: 05/27/18  7:02 AM  Result Value Ref Range   Glucose-Capillary 185 (H) 70 - 99 mg/dL   Comment 1  Notify RN   Glucose, capillary     Status: Abnormal   Collection Time: 05/27/18 11:33 AM  Result Value Ref Range   Glucose-Capillary 211 (H) 70 - 99 mg/dL   Comment 1 Notify RN    Comment 2 Document in Chart     Blood Alcohol level:  Lab Results  Component Value Date   ETH <10 05/13/2018   ETH <5 04/13/2017    Metabolic Disorder Labs: Lab Results  Component Value Date   HGBA1C 5.6 05/15/2018   MPG 114.02 05/15/2018   No results found for: PROLACTIN Lab Results  Component Value Date   CHOL 93 05/15/2018   TRIG 61 05/15/2018   HDL 45 05/15/2018   CHOLHDL 2.1 05/15/2018   VLDL 12 05/15/2018   LDLCALC 36 05/15/2018    Physical Findings: AIMS: Facial and Oral Movements Muscles of Facial Expression: None, normal Lips and Perioral Area: None, normal Jaw: None, normal Tongue: None, normal,Extremity Movements Upper (arms, wrists, hands, fingers): None, normal Lower (legs, knees, ankles, toes): None, normal, Trunk Movements Neck, shoulders, hips: None, normal, Overall Severity Severity of abnormal movements (highest score from questions above): None, normal Incapacitation due to abnormal movements: None, normal Patient's awareness of abnormal movements (rate only patient's report): No Awareness, Dental  Status Current problems with teeth and/or dentures?: No Does patient usually wear dentures?: No  CIWA:    COWS:     Musculoskeletal: Strength & Muscle Tone: within normal limits Gait & Station: normal Patient leans: N/A  Psychiatric Specialty Exam: Physical Exam  Nursing note and vitals reviewed. Constitutional: He appears well-developed and well-nourished.  Psychiatric: He has a normal mood and affect. His speech is normal and behavior is normal. Thought content normal. Cognition and memory are normal.    Review of Systems  Neurological: Negative.   Psychiatric/Behavioral: Negative.   All other systems reviewed and are negative.   Blood pressure (!) 106/92,  pulse 82, temperature 97.7 F (36.5 C), temperature source Oral, resp. rate 20, height 6\' 4"  (1.93 m), weight 110.7 kg (244 lb), SpO2 100 %.Body mass index is 29.7 kg/m.  General Appearance: Casual, neat, groomed well  Eye Contact:  Good  Speech:  Clear and Coherent  Volume:  Normal  Mood:  Euthymic  Affect:  Appropriate  Thought Process:  linear  Orientation:  Full (Time, Place, and Person)  Thought Content:  WDL  Suicidal Thoughts:  pt denies  Homicidal Thoughts:  pt denies  Memory:  grossly intact  Judgement:  Other:  improved; fair  Insight:  improved, fair  Psychomotor Activity:  Normal  Concentration:  Concentration: Fair and Attention Span: Fair  Recall:  [employer] of Knowledge:  Fair  Language:  Fair  Akathisia:  No  Handed:  Right  AIMS (if indicated):   0  Assets:  Communication Skills Desire for Improvement Financial Resources/Insurance Physical Health Resilience Social Support  ADL's:  Intact  Cognition:  WNL  Sleep:  Number of Hours: 7.3     Treatment Plan Summary: Daily contact with patient to assess and evaluate symptoms and progress in treatment and Medication management   Mr. Altschuler is a 47 year old male with a history of schizoaffective disorder admitted for psychotic behaviors, agitation and disorganization who has improved.  Mood is stable and patient is awaiting placement.    Plan: Schizoaffective disorder, bipolar type -perphenazine 8 mg TID -valproic acid level 101 on total daily dose of 2000 mg of depakote. Also, ammonia level elevated at 53.  LFTs wnl.  -klonopin 1 mg TID (0800, 1400, 2200) -cogentin 1 mg BID for EPS prophylaxis -trazodone 100 mg qhs (do not increase trazodone above 100 mg nightly--it was too activating for the patient at higher doses).     Disposition:  Group home manager, Oscar Sax, will be coming today after 3:30 PM to assess patient.  We are hopeful that patient will be able to discharge this afternoon after their  meeting.     [name], MD 05/27/2018, 1:01 PM

## 2018-05-27 NOTE — Plan of Care (Signed)
Patient is progress in all areas and doing well with his treatment regimen. Patient is anticipating discharge, contract for safety and denies any SI/HI/AVH no noticeable side effects with his medicines, appetite is good and well hydrated no distress noted, 15 minute rounding is maintained,  Problem: Coping: Goal: Ability to identify and develop effective coping behavior will improve Outcome: Progressing Goal: Ability to interact with others will improve Outcome: Progressing Goal: Demonstration of participation in decision-making regarding own care will improve Outcome: Progressing Goal: Ability to use eye contact when communicating with others will improve Outcome: Progressing   Problem: Self-Concept: Goal: Will verbalize positive feelings about self Outcome: Progressing   Problem: Education: Goal: Knowledge of [place] [facility] Education information/materials will improve Outcome: Progressing Goal: Emotional status will improve Outcome: Progressing Goal: Mental status will improve Outcome: Progressing Goal: Verbalization of understanding the information provided will improve Outcome: Progressing   Problem: Safety: Goal: Periods of time without injury will increase Outcome: Progressing   Problem: Education: Goal: Knowledge of General Education information will improve Outcome: Progressing

## 2018-05-27 NOTE — Plan of Care (Signed)
Voice of no safety concerns  working on coping skills . Limited interactions with peers . Able to maintain eye contact. Emotional and mental status improves. Staff continue to educate patient in a way that he understands .  Periods of emotional outburst ,but able to controlled.   Problem: Coping: Goal: Ability to identify and develop effective coping behavior will improve Outcome: Progressing Goal: Ability to interact with others will improve Outcome: Progressing Goal: Demonstration of participation in decision-making regarding own care will improve Outcome: Progressing Goal: Ability to use eye contact when communicating with others will improve Outcome: Progressing   Problem: Self-Concept: Goal: Will verbalize positive feelings about self Outcome: Progressing   Problem: Education: Goal: Knowledge of [place] [facility] Education information/materials will improve Outcome: Progressing Goal: Emotional status will improve Outcome: Progressing Goal: Mental status will improve Outcome: Progressing Goal: Verbalization of understanding the information provided will improve Outcome: Progressing   Problem: Safety: Goal: Periods of time without injury will increase Outcome: Progressing   Problem: Education: Goal: Knowledge of General Education information will improve Outcome: Progressing

## 2018-05-27 NOTE — Progress Notes (Signed)
Recreation Therapy Notes  Date: 05/27/2018  Time: 9:30 am  Location: Craft Room  Behavioral response: Appropriate    Intervention Topic: Relaxation  Discussion/Intervention:  Group content today was focused on relaxation. The group defined relaxation and identified healthy ways to relax. Individuals expressed how much time they spend relaxing. Patients expressed how much their life would be if they did not make time for themselves to relax. The group stated ways they could improve their relaxation techniques in the future.  Individuals participated in the intervention "Time to Relax" where they had a chance to experience different relaxation techniques.   Clinical Observations/Feedback:  Patient came to group and was focused on what peers and staff had to say about relaxation. He participated in the intervention and was social with staff and peers during group. Miamarie Moll LRT/CTRS         Philomina Leon 05/27/2018 10:51 AM

## 2018-05-27 NOTE — Progress Notes (Signed)
Patient ID: Oscar Duke, male   DOB: 11-20-1970, 47 y.o.   MRN: [id number] Dr. Flora Lipps'Neal reports talking with [name] with Pt's group home, who says she will come to re-asses Pt today to determine if he may return to group home.  CSW left voicemail with Pt's ACTT team provider asking when they will follow up with him and notifying of potential discharge tomorrow if accepted today.  Jake SharkSara Laticha Ferrucci, LCSW
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
## 2018-05-28 DIAGNOSIS — E871 Hypo-osmolality and hyponatremia: Secondary | ICD-10-CM

## 2018-05-28 DIAGNOSIS — F250 Schizoaffective disorder, bipolar type: Secondary | ICD-10-CM

## 2018-05-28 LAB — BASIC METABOLIC PANEL
Anion gap: 7 (ref 5–15)
BUN: 12 mg/dL (ref 6–20)
CALCIUM: 8.6 mg/dL — AB (ref 8.9–10.3)
CO2: 26 mmol/L (ref 22–32)
Chloride: 94 mmol/L — ABNORMAL LOW (ref 98–111)
Creatinine, Ser: 0.78 mg/dL (ref 0.61–1.24)
GFR calc Af Amer: >60 mL/min (ref >60)
Glucose, Bld: 141 mg/dL — ABNORMAL HIGH (ref 70–99)
Potassium: 4.3 mmol/L (ref 3.5–5.1)
Sodium: 127 mmol/L — ABNORMAL LOW (ref 135–145)

## 2018-05-28 LAB — GLUCOSE, CAPILLARY
Glucose-Capillary: 137 mg/dL — ABNORMAL HIGH (ref 70–99)
Glucose-Capillary: 182 mg/dL — ABNORMAL HIGH (ref 70–99)

## 2018-05-28 MED ORDER — ACETAMINOPHEN 500 MG PO TABS: 500.0000 mg | ORAL_TABLET | Freq: Four times a day (QID) | ORAL | 0 refills | Status: DC | PRN

## 2018-05-28 MED ORDER — LACTULOSE 10 GM/15ML PO SOLN: 10.0000 g | Freq: Two times a day (BID) | ORAL | 0 refills | Status: DC

## 2018-05-28 MED ORDER — PANTOPRAZOLE SODIUM 40 MG PO TBEC: 40.0000 mg | DELAYED_RELEASE_TABLET | Freq: Every day | ORAL | 0 refills | Status: DC

## 2018-05-28 MED ORDER — BENZTROPINE MESYLATE 1 MG PO TABS: 1.0000 mg | ORAL_TABLET | Freq: Two times a day (BID) | ORAL | 0 refills | Status: DC

## 2018-05-28 MED ORDER — CLONAZEPAM 1 MG PO TABS: 1.0000 mg | ORAL_TABLET | Freq: Three times a day (TID) | ORAL | 0 refills | Status: DC

## 2018-05-28 MED ORDER — TRAZODONE HCL 100 MG PO TABS: 100.0000 mg | ORAL_TABLET | Freq: Every day | ORAL | 0 refills | Status: DC

## 2018-05-28 MED ORDER — LEVETIRACETAM 1000 MG PO TABS: 1000.0000 mg | ORAL_TABLET | Freq: Every day | ORAL | 0 refills | Status: DC

## 2018-05-28 MED ORDER — AMLODIPINE BESYLATE 5 MG PO TABS: 5.0000 mg | ORAL_TABLET | Freq: Every day | ORAL | 0 refills | Status: DC

## 2018-05-28 MED ORDER — IBUPROFEN 400 MG PO TABS: 400.0000 mg | ORAL_TABLET | Freq: Four times a day (QID) | ORAL | 0 refills | Status: DC | PRN

## 2018-05-28 MED ORDER — LISINOPRIL 20 MG PO TABS: 20.0000 mg | ORAL_TABLET | Freq: Every day | ORAL | 0 refills | Status: DC

## 2018-05-28 MED ORDER — VITAMIN D3 50 MCG (2000 UT) PO TABS: 2000.0000 [IU] | ORAL_TABLET | Freq: Every day | ORAL | Status: DC

## 2018-05-28 MED ORDER — INSULIN ASPART 100 UNIT/ML ~~LOC~~ SOLN: 0.0000 [IU] | Freq: Three times a day (TID) | SUBCUTANEOUS | 0 refills | Status: DC

## 2018-05-28 MED ORDER — SODIUM CHLORIDE 1 G PO TABS
1.0000 g | ORAL_TABLET | Freq: Every day | ORAL | Status: DC
  Administered 2018-05-28: 1 g via ORAL
  Filled 2018-05-28 (×2): qty 1

## 2018-05-28 MED ORDER — DIVALPROEX SODIUM 500 MG PO DR TAB: 1000.0000 mg | DELAYED_RELEASE_TABLET | Freq: Every day | ORAL | 0 refills | Status: DC

## 2018-05-28 MED ORDER — PERPHENAZINE 8 MG PO TABS: 8.0000 mg | ORAL_TABLET | Freq: Three times a day (TID) | ORAL | 0 refills | Status: DC

## 2018-05-28 MED ORDER — DIVALPROEX SODIUM 500 MG PO DR TAB: 500.0000 mg | DELAYED_RELEASE_TABLET | Freq: Two times a day (BID) | ORAL | 0 refills | Status: DC

## 2018-05-28 MED ORDER — SODIUM CHLORIDE 1 G PO TABS: 1.0000 g | ORAL_TABLET | Freq: Every day | ORAL | 0 refills | Status: DC
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
## 2018-05-28 NOTE — Progress Notes (Signed)
  [facility]  Will you be returning to the same living situation after discharge:  Yes,    At discharge, do you have transportation home?: Yes,    Do you have the ability to pay for your medications: Yes,     Release of information consent forms completed and in the chart;  Patient's signature needed at discharge.  Patient to Follow up at: Follow-up Information    [facility] Follow up on 05/28/2018.   Why:  ACTT team will follow up with you tomorrow at Home. Contact information: [address] [place] [place] [address] [phone]        [facility] Go on 06/05/2018.   Why:  3:00pm for New Patient appointment for medical follow up Contact information: [address] [place], [address] [phone] Fax          Next level of care provider [facility]:No  Safety Planning and Suicide Prevention discussed: Yes,        Has patient been referred to the Quitline?: Patient refused referral  Patient has been referred for addiction treatment: Yes  Glennon MacSara P Fiorela Pelzer, LCSW 05/28/2018, 12:43 PM

## 2018-05-28 NOTE — Progress Notes (Signed)
I spoke with Oscar Duke [phone] and she reports she will come between 1:30pm-2:30pm to pick patient up. She's decided to try him at her smaller group home.   She will call social worker if she's able to come earlier.    Hessie KnowsSarita O'Neal, M.D.  Attending Psychiatrist

## 2018-05-28 NOTE — Progress Notes (Signed)
Recreation Therapy Notes  INPATIENT RECREATION TR PLAN  Patient Details Name: ESEQUIEL KLEINFELTER MRN: [id number] DOB: 08-04-1971 Today's Date: 05/28/2018  Rec Therapy Plan Is patient appropriate for Therapeutic Recreation?: Yes Treatment times per week: at least 3 Estimated Length of Stay: 5-7 days TR Treatment/Interventions: Group participation (Comment)  Discharge Criteria Pt will be discharged from therapy if:: Discharged Treatment plan/goals/alternatives discussed and agreed upon by:: Patient/family  Discharge Summary Short term goals set: Patient will engage in groups with a calm and appropriate mood at least 2x within 5 recreation therapy group sessions Short term goals met: Adequate for discharge Progress toward goals comments: Groups attended Which groups?: Stress management, Self-esteem, Anger management, Coping skills, Other (Comment)(Team work, relaation) Reason goals not met: N/A Therapeutic equipment acquired: N/A Reason patient discharged from therapy: Discharge from hospital Pt/family agrees with progress & goals achieved: Yes Date patient discharged from therapy: 05/28/18   Aleisha Paone 05/28/2018, 1:29 PM

## 2018-05-28 NOTE — Progress Notes (Signed)
Recreation Therapy Notes  Date: 05/28/2018  Time: 9:30 am  Location: Craft Room  Behavioral response: Appropriate    Intervention Topic: Goals  Discussion/Intervention:  Group content on today was focused on goals. Patients described what goals are and how they define goals. Individuals expressed how they go about setting goals and reaching them. The group identified how important goals are and if they make short term goals to reach long term goals. Patients described how many goals they work on at a time and what affects them not reaching their goal. Individuals described how much time they put into planning and obtaining their goals. The group participated in the intervention "My Goal Board" and made personal goal boards to help them achieve their goal.  Clinical Observations/Feedback:  Patient came to group and had to be redirected by group facilitator due to not having positivie contributions toward the group discussion. Individual became agitated with redirection from group facilitator and began to talk over staff and peers. Patient was asked to leave group. Individual left group. Chozen Latulippe LRT/CTRS         Greene Diodato 05/28/2018 11:33 AM

## 2018-05-28 NOTE — Progress Notes (Signed)
D:Patient denies SI/HI at this time. Pt appears calm and cooperative, and no distress noted. Pt. Given extensive discharge education.  A: All Personal items in locker returned to pt. Pt escorted of the unit with by staff. Pt. Escorted to his ride.  R:  Pt States he will comply with treatment plan provided, and take MEDS as prescribed.

## 2018-05-28 NOTE — Discharge Summary (Addendum)
Physician Discharge Summary Note  Patient:  Oscar Duke is an 47 y.o., male MRN:  [id number] DOB:  1971-02-06 Patient phone:  [phone] (home)  Patient address:   [address] [place] [address],  Total Time spent with patient, reviewing his chart, calling his healthcare power of attorney, calling internal medicine consultant and discussing patient with his treatment team:45 minutes     Date of Admission:  05/14/2018 Date of Discharge: 05/28/2018  Reason for Admission/Chief Complaint: "I'm here to get help for my anger."     Principal Problem: Schizoaffective disorder, bipolar type [facility]) Discharge Diagnoses: Patient Active Problem List   Diagnosis Date Noted  . Hyponatremia [E87.1] 05/28/2018  . Schizoaffective disorder, bipolar type (HCC) [F25.0] 05/14/2018  . Somnolence [R40.0] 10/05/2017  . Adverse drug interaction with prescription medication [T50.905A] 10/05/2017  . Hypertension [I10]   . Diabetes mellitus without complication (HCC) [E11.9]   . Bipolar 1 disorder (HCC) [F31.9]   . Seizures (HCC) [R56.9]   . Schizophrenia, acute (HCC) [F23] 05/14/2017   Past Psychiatric History: Multiple previous psych hospitalizations-[facility], [facility].  Pt with longstanding hx of mental illness (schizophrenia vs schizoaffective, bipolar type).    Past Medical History:  Past Medical History:  Diagnosis Date  . Bipolar 1 disorder (HCC)   . Diabetes mellitus without complication (HCC)   . Hyperlipidemia   . Hypertension   . 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 Schizophrenia, acute (HCC)   . Seizures (HCC)     Past Surgical History:  Procedure Laterality Date  . JOINT REPLACEMENT     Family History: History reviewed. No pertinent family history. Family Psychiatric  History: unknown Social History:  Social History   Substance and Sexual Activity  Alcohol Use No     Social History   Substance and Sexual Activity  Drug Use Yes   . Types: Marijuana   Comment: quit 5 years ago    Social History   Socioeconomic History  . Marital status: Single    Spouse name: Not on file  . Number of children: Not on file  . Years of education: Not on file  . Highest education level: Not on file  Occupational History  . Not on file  Social Needs  . Financial resource strain: Not on file  . Food insecurity:    Worry: Not on file    Inability: Not on file  . Transportation needs:    Medical: Not on file    Non-medical: Not on file  Tobacco Use  . Smoking status: Never Smoker  . Smokeless tobacco: Never Used  Substance and Sexual Activity  . Alcohol use: No  . Drug use: Yes    Types: Marijuana    Comment: quit 5 years ago  . Sexual activity: Not on file  Lifestyle  . Physical activity:    Days per week: Not on file    Minutes per session: Not on file  . Stress: Not on file  Relationships  . Social connections:    Talks on phone: Not on file    Gets together: Not on file    Attends religious service: Not on file    Active member of club or organization: Not on file    Attends meetings of clubs or organizations: Not on file    Relationship status: Not on file  Other Topics Concern  . Not on file  Social History Narrative  . Not on file    Hospital Course:  Patient is a 47 year old male with a long history of schizoaffective disorder who was presented from his group home under petition for involuntary commitment.  Patient reportedly was displaying disruptive and agitated behaviors.  Patient reported that he was here to get help for his "anger."  Patient reportedly would make threats towards others, but then later said he did not mean it.  On initial presentation, the patient was disorganized, tangential, and had flight of ideas.  It was unclear if patient had been compliant with his medications at the prior group home.  Patient was re-started on his medications, and responded well.  Patient's Depakote dose was  adjusted (see medication list below).  Patient's mood stabilized with his current combination of Depakote, perphenazine, and Klonopin.  Patient is noted to have a seizure disorder and remains on Depakote and Keppra.  Patient slept well with trazodone nightly.  Patient was also noted to be hyponatremic (low sodium of 127) on day of discharge.  I discussed patient's hyponatremia with the internal medicine consultant who recommended to restart the patient on sodium chloride tablets 1 g daily and follow-up with his primary care provider in 5-7 days for repeat sodium level.  Patient is currently asymptomatic.  He denies blurry vision, headache, nausea, vomiting, diarrhea, chest pain, shortness of breath, and dizziness at time of assessment.    Patient was discharged in improved condition.  On day of discharge patient denied suicidal ideation, homicidal ideation, auditory hallucinations, visual hallucinations, and paranoia.  Crisis plan was discussed with patient.   Results for Oscar, Duke (MRN [id number]) as of 05/28/2018 18:28  Ref. Range 05/28/2018 06:27  BASIC METABOLIC PANEL Unknown Rpt (A)  Sodium Latest Ref Range: 135 - 145 mmol/L 127 (L)  Potassium Latest Ref Range: 3.5 - 5.1 mmol/L 4.3  Chloride Latest Ref Range: 98 - 111 mmol/L 94 (L)  CO2 Latest Ref Range: 22 - 32 mmol/L 26  Glucose Latest Ref Range: 70 - 99 mg/dL 782 (H)  BUN Latest Ref Range: 6 - 20 mg/dL 12  Creatinine Latest Ref Range: 0.61 - 1.24 mg/dL 9.56  Calcium Latest Ref Range: 8.9 - 10.3 mg/dL 8.6 (L)  Anion gap Latest Ref Range: 5 - 15  7  GFR, Est Non African American Latest Ref Range: >60 mL/min >60  GFR, Est African American Latest Ref Range: >60 mL/min >60   Results for Oscar, Duke (MRN [id number]) as of 05/28/2018 18:28  Ref. Range 05/15/2018 06:25  Total CHOL/HDL Ratio Latest Units: RATIO 2.1  Cholesterol Latest Ref Range: 0 - 200 mg/dL 93  HDL Cholesterol Latest Ref Range: >40 mg/dL 45  LDL (calc) Latest Ref Range: 0  - 99 mg/dL 36  Triglycerides Latest Ref Range: <150 mg/dL 61  VLDL Latest Ref Range: 0 - 40 mg/dL 12   Results for Oscar, Duke (MRN [id number]) as of 05/28/2018 18:28  Ref. Range 05/15/2018 06:25  Hemoglobin A1C Latest Ref Range: 4.8 - 5.6 % 5.6  TSH Latest Ref Range: 0.350 - 4.500 uIU/mL 2.117   Results for Oscar, Duke (MRN [id number]) as of 05/28/2018 18:28  Ref. Range 05/21/2018 07:12 05/26/2018 06:28  Alkaline Phosphatase Latest Ref Range: 38 - 126 U/L 60   Albumin Latest Ref Range: 3.5 - 5.0 g/dL 3.8   AST Latest Ref Range: 15 - 41 U/L 33   ALT Latest Ref Range: 0 - 44 U/L 36   Total Protein Latest Ref Range: 6.5 - 8.1 g/dL 6.9   Ammonia Latest Ref Range:  9 - 35 umol/L 57 (H) 53 (H)  Bilirubin, Direct Latest Ref Range: 0.0 - 0.2 mg/dL <7.8   Indirect Bilirubin Latest Ref Range: 0.3 - 0.9 mg/dL NOT CALCULATED   Total Bilirubin Latest Ref Range: 0.3 - 1.2 mg/dL 0.5    Results for Oscar, Duke (MRN [id number]) as of 05/28/2018 18:28  Ref. Range 05/26/2018 06:28  Valproic Acid,S Latest Ref Range: 50.0 - 100.0 ug/mL 101 (H)    Physical Findings: AIMS: Facial and Oral Movements Muscles of Facial Expression: None, normal Lips and Perioral Area: None, normal Jaw: None, normal Tongue: None, normal,Extremity Movements Upper (arms, wrists, hands, fingers): None, normal Lower (legs, knees, ankles, toes): None, normal, Trunk Movements Neck, shoulders, hips: None, normal, Overall Severity Severity of abnormal movements (highest score from questions above): None, normal Incapacitation due to abnormal movements: None, normal Patient's awareness of abnormal movements (rate only patient's report): No Awareness, Dental Status Current problems with teeth and/or dentures?: No Does patient usually wear dentures?: No   Musculoskeletal: Strength & Muscle Tone: within normal limits Gait & Station: normal Patient leans: normal stance  Psychiatric Specialty Exam: Physical Exam  Nursing note  and vitals reviewed. Constitutional: He appears well-developed and well-nourished.  HENT:  Head: Normocephalic and atraumatic.  Cardiovascular: Normal rate and regular rhythm.  GI: Soft. Bowel sounds are normal.    ROS  Blood pressure 114/70, pulse 89, temperature 97.9 F (36.6 C), temperature source Oral, resp. rate 17, height 6\' 4"  (1.93 m), weight 110.7 kg (244 lb), SpO2 100 %.Body mass index is 29.7 kg/m.  Review of Systems  Constitutional: Negative for chills, diaphoresis, fever and malaise/fatigue.  HENT: Negative for congestion, ear pain, sinus pain and sore throat.   Eyes: Negative for blurred vision and pain.  Respiratory: Negative for cough, shortness of breath and wheezing.   Cardiovascular: Negative for chest pain and leg swelling.  Gastrointestinal: Negative for abdominal pain, constipation, diarrhea, heartburn, nausea and vomiting.  Genitourinary: Negative for dysuria.  Musculoskeletal: Negative for joint pain and myalgias.  Skin: Negative for rash.       Skin discoloration on lower left leg due to "[place]"  Neurological: Negative for dizziness, tingling, tremors, loss of consciousness, weakness and headaches.  Psychiatric/Behavioral: Negative for depression, hallucinations, memory loss, substance abuse and suicidal ideas. The patient is not nervous/anxious and does not have insomnia.   General Appearance:Casual, neat, groomed well  Eye Contact: Good  Speech: Clear and Coherent  Volume: Normal  Mood: Euthymic-"I feel good."  Affect: Appropriate  Thought Process:linear  Orientation: Full (Time, Place, and Person)  Thought Content: WDL  Suicidal Thoughts: pt denies  Homicidal Thoughts: pt denies  Memory: grossly intact  Judgement:Other:improved; fair  Insight:improved, fair  Psychomotor Activity: Normal  Concentration: Concentration: Fair  Recall: fair  Fund of Knowledge: Fair  Language: Fair  Akathisia: No  Handed: Right  AIMS (if  indicated):0  Assets: Communication Skills Desire for Improvement Financial Resources/Insurance Physical Health Resilience Social Support  ADL's: Intact  Cognition: WNL  Sleep:Number of Hours: 7.5          Has this patient used any form of tobacco in the last 30 days? (Cigarettes, Smokeless Tobacco, Cigars, and/or Pipes)  No  Blood Alcohol level:  Lab Results  Component Value Date   ETH <10 05/13/2018   ETH <5 04/13/2017    Metabolic Disorder Labs:  Lab Results  Component Value Date   HGBA1C 5.6 05/15/2018   MPG 114.02 05/15/2018   No results found for: PROLACTIN  Lab Results  Component Value Date   CHOL 93 05/15/2018   TRIG 61 05/15/2018   HDL 45 05/15/2018   CHOLHDL 2.1 05/15/2018   VLDL 12 05/15/2018   LDLCALC 36 05/15/2018    See Psychiatric Specialty Exam and Suicide Risk Assessment completed by Attending Physician prior to discharge.  Discharge destination:  Other:  group home  Is patient on multiple antipsychotic therapies at discharge:  No    Discharge Instructions    Diet Carb Modified   Complete by:  As directed    Increase activity slowly   Complete by:  As directed      Allergies as of 05/28/2018      Reactions   Haldol [haloperidol Lactate]    Inderal [propranolol]    Januvia [sitagliptin] Other (See Comments)   Skin discoloration on lower legs   Lithium Other (See Comments)   Seizures      Medication List    STOP taking these medications   atorvastatin 40 MG tablet Commonly known as:  LIPITOR   cyclobenzaprine 10 MG tablet Commonly known as:  FLEXERIL   hydrOXYzine 50 MG capsule Commonly known as:  VISTARIL   omeprazole 20 MG capsule Commonly known as:  PRILOSEC   propranolol 10 MG tablet Commonly known as:  INDERAL     TAKE these medications     Indication  acetaminophen 500 MG tablet Commonly known as:  TYLENOL Take 1 tablet (500 mg total) by mouth every 6 (six) hours as needed for mild pain, moderate pain,  fever or headache.  Indication:  Fever, Pain   amLODipine 5 MG tablet Commonly known as:  NORVASC Take 1 tablet (5 mg total) by mouth daily. What changed:    medication strength  how much to take  Indication:  High Blood Pressure Disorder   benztropine 1 MG tablet Commonly known as:  COGENTIN Take 1 tablet (1 mg total) by mouth 2 (two) times daily.  Indication:  EPS prophylaxis   clonazePAM 1 MG tablet Commonly known as:  KLONOPIN Take 1 tablet (1 mg total) by mouth 3 (three) times daily.  Indication:  seizure disorder, anxiety, agitation   divalproex 500 MG DR tablet Commonly known as:  DEPAKOTE Take 1 tablet (500 mg total) by mouth 2 (two) times daily. Take at 1 tab (500 mg) at 6am and 1 tab (500 mg) at 2pm. What changed:    how much to take  when to take this  additional instructions  Indication:  mood disorder, seizure disorder   divalproex 500 MG DR tablet Commonly known as:  DEPAKOTE Take 2 tablets (1,000 mg total) by mouth at bedtime. What changed:  You were already taking a medication with the same name, and this prescription was added. Make sure you understand how and when to take each.  Indication:  mood disorder, seizure disorder   ibuprofen 400 MG tablet Commonly known as:  ADVIL,MOTRIN Take 1 tablet (400 mg total) by mouth every 6 (six) hours as needed for fever, headache, mild pain, moderate pain or cramping. What changed:    medication strength  how much to take  reasons to take this  additional instructions  Indication:  Fever, Migraine Headache   insulin aspart 100 UNIT/ML injection Commonly known as:  novoLOG Inject 0-15 Units into the skin 3 (three) times daily with meals. CBG < 70: implement hypoglycemia protocol CBG 70 - 120: 0 units CBG 121 - 150: 2 units CBG 151 - 200: 3 units CBG 201 -  250: 5 units CBG 251 - 300: 8 units CBG 301 - 350: 11 units CBG 351 - 400: 15 units CBG > 400 call MD and obtain STAT lab verification   Indication:  Type 2 Diabetes   insulin detemir 100 UNIT/ML injection Commonly known as:  LEVEMIR Inject 28 Units into the skin daily.  Indication:  take at 0800 daily   lactulose 10 GM/15ML solution Commonly known as:  CHRONULAC Take 15 mLs (10 g total) by mouth 2 (two) times daily. What changed:    medication strength  how much to take  when to take this  Indication:  Elevated ammonia level   levETIRAcetam 1000 MG tablet Commonly known as:  KEPPRA Take 1 tablet (1,000 mg total) by mouth at bedtime.  Indication:  seizure disorder   lisinopril 20 MG tablet Commonly known as:  PRINIVIL,ZESTRIL Take 1 tablet (20 mg total) by mouth daily.  Indication:  High Blood Pressure Disorder   pantoprazole 40 MG tablet Commonly known as:  PROTONIX Take 1 tablet (40 mg total) by mouth daily.  Indication:  Gastroesophageal Reflux Disease   perphenazine 8 MG tablet Commonly known as:  TRILAFON Take 1 tablet (8 mg total) by mouth 3 (three) times daily. What changed:    how much to take  additional instructions  Indication:  Schizophrenia   sodium chloride 1 g tablet Take 1 tablet (1 g total) by mouth daily.  Indication:  Electrolyte Disorder, hyponatremia   traZODone 100 MG tablet Commonly known as:  DESYREL Take 1 tablet (100 mg total) by mouth at bedtime. What changed:    medication strength  how much to take  Indication:  Trouble Sleeping   Vitamin D3 2000 units Tabs Take 2,000 Units by mouth daily.  Indication:  Vit D supplement      Follow-up Information    [facility] Follow up on 05/28/2018.   Why:  ACTT team will follow up with you tomorrow at Home. Contact information: [address] [place] [address] [phone]        [facility] Go on 06/05/2018.   Why:  3:00pm for New Patient appointment for medical follow up Contact information: [address] [place], [address] [phone] Fax            Plan Of Care/Follow-up recommendations:  Activity:  as tolerated Diet:  heart healthy Tests:  Follow up with Primary Care Physician in 5-7 days to recheck Sodium Level.  Sodium was 127 mmol/L on 05/28/18.  Sodium Chloride 1 g tablets were restarted on 05/28/18.  Must follow up with primary care provider.  Other:  Follow up with ACT Team and Primary Care Physician.   Also can recheck ammonia level and depakote (valproic acid) level in 5-7 days.       Signed: Hessie KnowsSarita O'Neal, MD 05/28/2018, 6:35 PM

## 2018-05-28 NOTE — [place]2 (Signed)
[place] MEDICAID FL2 LEVEL OF CARE SCREENING TOOL     IDENTIFICATION  Patient Name: Oscar Duke Birthdate: 1970-11-15 Sex: male Admission Date (Current Location): 05/14/2018  [place] [employer]   Facility and Address:  [facility], [address], [place], [place] [address]      Provider Number: [id number]  Attending Physician Name and Address:  [name]'Neal, Sarita, MD  Relative Name and Phone Number:  Beckie SaltsHerring,David Michael Relative  [phone]     Current Level of Care: Hospital [facility] Prior Approval Number:    Date Approved/Denied:   PASRR Number:    Discharge Plan: Domiciliary (Rest home)    Current Diagnoses: Patient Active Problem List   Diagnosis Date Noted  . Schizoaffective disorder, bipolar type (HCC) 05/14/2018  . Somnolence 10/05/2017  . Adverse drug interaction with prescription medication 10/05/2017  . Hypertension   . Diabetes mellitus without complication (HCC)   . Bipolar 1 disorder (HCC)   . Seizures (HCC)   . Schizophrenia, acute (HCC) 05/14/2017    Orientation RESPIRATION BLADDER Height & Weight     Self, Time, Situation, Place  Normal Continent Weight: 244 lb (110.7 kg) Height:  6\' 4"  (193 cm)  BEHAVIORAL SYMPTOMS/MOOD NEUROLOGICAL BOWEL NUTRITION STATUS  Verbally abusive at times   Continent    AMBULATORY STATUS COMMUNICATION OF NEEDS Skin   Independent Verbally Normal                       Personal Care Assistance Level of Assistance  Bathing, Feeding, Dressing Bathing Assistance: Independent Feeding assistance: Independent Dressing Assistance: Independent     Functional Limitations Info  Sight, Hearing, Speech Sight Info: Adequate Hearing Info: Adequate Speech Info: Adequate    SPECIAL CARE FACTORS FREQUENCY                       Contractures Contractures Info: Not present    Additional Factors Info  Insulin Sliding Scale  See  medication list below             Current Medications (05/28/2018):  This is the current hospital active medication list Current Facility-Administered Medications  Medication Dose Route Frequency Provider Last Rate Last Dose  . acetaminophen (TYLENOL) tablet 650 mg  650 mg Oral Q6H PRN Clapacs, Jackquline DenmarkJohn T, MD   650 mg at 05/27/18 0511  . alum & mag hydroxide-simeth (MAALOX/MYLANTA) 200-200-20 MG/5ML suspension 30 mL  30 mL Oral Q4H PRN Clapacs, John T, MD      . amLODipine (NORVASC) tablet 5 mg  5 mg Oral Daily Clapacs, Jackquline DenmarkJohn T, MD   5 mg at 05/28/18 0851  . benztropine (COGENTIN) tablet 1 mg  1 mg Oral BID Clapacs, Jackquline DenmarkJohn T, MD   1 mg at 05/28/18 0850  . clonazePAM (KLONOPIN) tablet 1 mg  1 mg Oral TID [name]'Neal, Sarita, MD   1 mg at 05/28/18 0850  . divalproex (DEPAKOTE) DR tablet 1,000 mg  1,000 mg Oral QHS [name]'Neal, Sarita, MD   1,000 mg at 05/27/18 2109  . divalproex (DEPAKOTE) DR tablet 500 mg  500 mg Oral BID [name]'Neal, Sarita, MD   500 mg at 05/28/18 0527  . guaiFENesin-dextromethorphan (ROBITUSSIN DM) 100-10 MG/5ML syrup 5-10 mL  5-10 mL Oral Q6H PRN [name]'Neal, Sarita, MD      . hydrOXYzine (ATARAX/VISTARIL) tablet 50 mg  50 mg Oral Q4H PRN Clapacs, Jackquline DenmarkJohn T, MD   50 mg  at 05/27/18 2109  . ibuprofen (ADVIL,MOTRIN) tablet 400 mg  400 mg Oral Q6H PRN [name], MD   400 mg at 05/28/18 0528  . insulin aspart (novoLOG) injection 0-15 Units  0-15 Units Subcutaneous TID WC Clapacs, Jackquline Denmark, MD   2 Units at 05/28/18 [phone]  . insulin detemir (LEVEMIR) injection 28 Units  28 Units Subcutaneous Daily Clapacs, Jackquline Denmark, MD   28 Units at 05/28/18 [phone]  . lactulose (CHRONULAC) 10 GM/15ML solution 10 g  10 g Oral BID Clapacs, Jackquline Denmark, MD   10 g at 05/28/18 0849  . levETIRAcetam (KEPPRA) tablet 1,000 mg  1,000 mg Oral QHS Clapacs, John T, MD   1,000 mg at 05/27/18 2108  . lisinopril (PRINIVIL,ZESTRIL) tablet 20 mg  20 mg Oral Daily Clapacs, Jackquline Denmark, MD   20 mg at 05/28/18 0850  . LORazepam (ATIVAN) tablet 2 mg  2 mg  Oral Q4H PRN Pucilowska, Jolanta B, MD   2 mg at 05/27/18 1751   Or  . LORazepam (ATIVAN) injection 2 mg  2 mg Intramuscular Q4H PRN Pucilowska, Jolanta B, MD      . magnesium hydroxide (MILK OF MAGNESIA) suspension 30 mL  30 mL Oral Daily PRN Clapacs, Jackquline Denmark, MD      . neomycin-bacitracin-polymyxin (NEOSPORIN) ointment   Topical BID [name], MD   1 application at 05/28/18 0850  . OLANZapine (ZYPREXA) tablet 15 mg  15 mg Oral TID PRN Pucilowska, Jolanta B, MD   15 mg at 05/16/18 2039   Or  . OLANZapine (ZYPREXA) injection 10 mg  10 mg Intramuscular TID PRN Pucilowska, Jolanta B, MD      . pantoprazole (PROTONIX) EC tablet 40 mg  40 mg Oral Daily Clapacs, Jackquline Denmark, MD   40 mg at 05/28/18 0850  . perphenazine (TRILAFON) tablet 8 mg  8 mg Oral TID Clapacs, Jackquline Denmark, MD   8 mg at 05/28/18 0851  . sodium chloride tablet 1 g  1 g Oral Daily [name], MD   1 g at 05/28/18 1051  . traZODone (DESYREL) tablet 100 mg  100 mg Oral QHS [name], MD   100 mg at 05/27/18 2109     Discharge Medications: Please see discharge summary for a list of discharge medications.  Relevant Imaging Results:  Relevant Lab Results:   Additional Information SSN: [id number], Allergies-Haldol, Inderal, Januvia, Lithium  Glennon Mac, LCSW

## 2018-05-30 ENCOUNTER — Telehealth: Payer: Self-pay | Admitting: Psychiatry

## 2018-05-30 MED ORDER — INSULIN DETEMIR 100 UNIT/ML ~~LOC~~ SOLN: 28.0000 [IU] | Freq: Every day | SUBCUTANEOUS | 0 refills | Status: DC

## 2018-05-30 MED ORDER — VITAMIN D3 50 MCG (2000 UT) PO TABS: 2000.0000 [IU] | ORAL_TABLET | Freq: Every day | ORAL | 0 refills | Status: DC

## 2018-05-30 NOTE — Telephone Encounter (Signed)
Received call from [name] from pt's [facility] to "see discharge summary" for list of patient's current medications.  However, Ms. Ray never received the discharge summary.  The discharge summary was done on day of discharge, but was reportedly never faxed to the group home.  I spoke with the hospital social worker who informed me that she was unable to send the discharge summary after the patient leaves the hospital due to the consent no longer being valid once the patients leaves, and advised for the group home to go through medical records to get the summary.   Ms. Rosalia HammersRay states she either needs the discharge summary or for the psychiatrist to sign the after visit summary.  Additionally, patient will not see his new primary care provider until next week, and needs a refill on his levemir and vit D medications.  Ms. Rosalia HammersRay plans to come to the hospital so I can sign the after visit summary with the list of discharge medications, and I will also prescribe the levemir and vit D prescriptions.    Hessie KnowsSarita O'Neal, M.D.  Attending Psychiatrist

## 2018-07-09 ENCOUNTER — Encounter: Payer: Self-pay | Admitting: Emergency Medicine

## 2018-07-09 ENCOUNTER — Inpatient Hospital Stay
Admission: EM | Admit: 2018-07-09 | Discharge: 2018-07-11 | DRG: 885 | Disposition: A | Discharge status: Home / Self Care | Payer: Medicare Other | Attending: Internal Medicine | Admitting: Internal Medicine

## 2018-07-09 ENCOUNTER — Emergency Department: Payer: Medicare Other

## 2018-07-09 ENCOUNTER — Inpatient Hospital Stay: Payer: Medicare Other

## 2018-07-09 DIAGNOSIS — R42 Dizziness and giddiness: Secondary | ICD-10-CM | POA: Diagnosis present

## 2018-07-09 DIAGNOSIS — D696 Thrombocytopenia, unspecified: Secondary | ICD-10-CM | POA: Diagnosis present

## 2018-07-09 DIAGNOSIS — Z87820 Personal history of traumatic brain injury: Secondary | ICD-10-CM

## 2018-07-09 DIAGNOSIS — M542 Cervicalgia: Secondary | ICD-10-CM | POA: Diagnosis present

## 2018-07-09 DIAGNOSIS — W010XXA Fall on same level from slipping, tripping and stumbling without subsequent striking against object, initial encounter: Secondary | ICD-10-CM | POA: Diagnosis present

## 2018-07-09 DIAGNOSIS — Z888 Allergy status to other drugs, medicaments and biological substances status: Secondary | ICD-10-CM

## 2018-07-09 DIAGNOSIS — G40909 Epilepsy, unspecified, not intractable, without status epilepticus: Secondary | ICD-10-CM | POA: Diagnosis present

## 2018-07-09 DIAGNOSIS — Z9660 Presence of unspecified orthopedic joint implant: Secondary | ICD-10-CM | POA: Diagnosis present

## 2018-07-09 DIAGNOSIS — Z79899 Other long term (current) drug therapy: Secondary | ICD-10-CM | POA: Diagnosis not present

## 2018-07-09 DIAGNOSIS — E8779 Other fluid overload: Secondary | ICD-10-CM | POA: Diagnosis present

## 2018-07-09 DIAGNOSIS — E7220 Disorder of urea cycle metabolism, unspecified: Secondary | ICD-10-CM | POA: Diagnosis present

## 2018-07-09 DIAGNOSIS — E871 Hypo-osmolality and hyponatremia: Secondary | ICD-10-CM | POA: Diagnosis present

## 2018-07-09 DIAGNOSIS — R292 Abnormal reflex: Secondary | ICD-10-CM | POA: Diagnosis present

## 2018-07-09 DIAGNOSIS — E119 Type 2 diabetes mellitus without complications: Secondary | ICD-10-CM | POA: Diagnosis present

## 2018-07-09 DIAGNOSIS — F250 Schizoaffective disorder, bipolar type: Secondary | ICD-10-CM | POA: Diagnosis present

## 2018-07-09 DIAGNOSIS — M545 Low back pain: Secondary | ICD-10-CM | POA: Diagnosis present

## 2018-07-09 DIAGNOSIS — Z794 Long term (current) use of insulin: Secondary | ICD-10-CM | POA: Diagnosis not present

## 2018-07-09 DIAGNOSIS — Z8659 Personal history of other mental and behavioral disorders: Secondary | ICD-10-CM | POA: Diagnosis not present

## 2018-07-09 DIAGNOSIS — R296 Repeated falls: Secondary | ICD-10-CM | POA: Diagnosis present

## 2018-07-09 DIAGNOSIS — R52 Pain, unspecified: Secondary | ICD-10-CM

## 2018-07-09 DIAGNOSIS — I10 Essential (primary) hypertension: Secondary | ICD-10-CM | POA: Diagnosis present

## 2018-07-09 DIAGNOSIS — Y92009 Unspecified place in unspecified non-institutional (private) residence as the place of occurrence of the external cause: Secondary | ICD-10-CM

## 2018-07-09 DIAGNOSIS — T426X1A Poisoning by other antiepileptic and sedative-hypnotic drugs, accidental (unintentional), initial encounter: Secondary | ICD-10-CM

## 2018-07-09 DIAGNOSIS — R7989 Other specified abnormal findings of blood chemistry: Secondary | ICD-10-CM

## 2018-07-09 DIAGNOSIS — T426X5A Adverse effect of other antiepileptic and sedative-hypnotic drugs, initial encounter: Secondary | ICD-10-CM | POA: Diagnosis present

## 2018-07-09 DIAGNOSIS — R631 Polydipsia: Secondary | ICD-10-CM | POA: Diagnosis present

## 2018-07-09 LAB — COMPREHENSIVE METABOLIC PANEL
ALK PHOS: 35 U/L — AB (ref 38–126)
ALT: 7 U/L (ref 0–44)
AST: 17 U/L (ref 15–41)
Albumin: 3.9 g/dL (ref 3.5–5.0)
Anion gap: 8 (ref 5–15)
BUN: 7 mg/dL (ref 6–20)
CALCIUM: 8.9 mg/dL (ref 8.9–10.3)
CO2: 24 mmol/L (ref 22–32)
CREATININE: 0.82 mg/dL (ref 0.61–1.24)
Chloride: 91 mmol/L — ABNORMAL LOW (ref 98–111)
GFR calc non Af Amer: >60 mL/min (ref >60)
GLUCOSE: 92 mg/dL (ref 70–99)
Potassium: 4.3 mmol/L (ref 3.5–5.1)
SODIUM: 123 mmol/L — AB (ref 135–145)
Total Bilirubin: 0.6 mg/dL (ref 0.3–1.2)
Total Protein: 6.7 g/dL (ref 6.5–8.1)

## 2018-07-09 LAB — AMMONIA: AMMONIA: 114 umol/L — AB (ref 9–35)

## 2018-07-09 LAB — URINALYSIS, COMPLETE (UACMP) WITH MICROSCOPIC
BACTERIA UA: NONE SEEN
Bilirubin Urine: NEGATIVE
Glucose, UA: NEGATIVE mg/dL
Ketones, ur: NEGATIVE mg/dL
Leukocytes, UA: NEGATIVE
Nitrite: NEGATIVE
Protein, ur: NEGATIVE mg/dL
SPECIFIC GRAVITY, URINE: 1.008 (ref 1.005–1.030)
pH: 7 (ref 5.0–8.0)

## 2018-07-09 LAB — TSH: TSH: 1.688 u[IU]/mL (ref 0.350–4.500)

## 2018-07-09 LAB — CBC WITH DIFFERENTIAL/PLATELET
Basophils Absolute: 0 10*3/uL (ref 0–0.1)
Basophils Relative: 1 %
EOS ABS: 0.1 10*3/uL (ref 0–0.7)
Eosinophils Relative: 2 %
HEMATOCRIT: 35.4 % — AB (ref 40.0–52.0)
HEMOGLOBIN: 12.6 g/dL — AB (ref 13.0–18.0)
LYMPHS ABS: 1.1 10*3/uL (ref 1.0–3.6)
Lymphocytes Relative: 19 %
MCH: 31.5 pg (ref 26.0–34.0)
MCHC: 35.6 g/dL (ref 32.0–36.0)
MCV: 88.4 fL (ref 80.0–100.0)
Monocytes Absolute: 0.6 10*3/uL (ref 0.2–1.0)
Monocytes Relative: 10 %
NEUTROS ABS: 4 10*3/uL (ref 1.4–6.5)
NEUTROS PCT: 68 %
Platelets: 87 10*3/uL — ABNORMAL LOW (ref 150–440)
RBC: 4.01 MIL/uL — ABNORMAL LOW (ref 4.40–5.90)
RDW: 14.8 % — ABNORMAL HIGH (ref 11.5–14.5)
WBC: 5.7 10*3/uL (ref 3.8–10.6)

## 2018-07-09 LAB — TROPONIN I: Troponin I: <0.03 ng/mL (ref <0.03)

## 2018-07-09 LAB — VALPROIC ACID LEVEL: VALPROIC ACID LVL: 129 ug/mL — AB (ref 50.0–100.0)

## 2018-07-09 LAB — GLUCOSE, CAPILLARY
GLUCOSE-CAPILLARY: 87 mg/dL (ref 70–99)
GLUCOSE-CAPILLARY: 101 mg/dL — AB (ref 70–99)

## 2018-07-09 MED ORDER — BENZTROPINE MESYLATE 1 MG PO TABS
1.0000 mg | ORAL_TABLET | Freq: Two times a day (BID) | ORAL | Status: DC
  Administered 2018-07-09 – 2018-07-11 (×4): 1 mg via ORAL
  Filled 2018-07-09 (×5): qty 1

## 2018-07-09 MED ORDER — INSULIN ASPART 100 UNIT/ML ~~LOC~~ SOLN
0.0000 [IU] | Freq: Every day | SUBCUTANEOUS | Status: DC
  Filled 2018-07-09: qty 1

## 2018-07-09 MED ORDER — LACTULOSE 10 GM/15ML PO SOLN
10.0000 g | Freq: Three times a day (TID) | ORAL | Status: DC
  Administered 2018-07-09 – 2018-07-11 (×7): 10 g via ORAL
  Filled 2018-07-09 (×7): qty 30

## 2018-07-09 MED ORDER — LEVETIRACETAM 500 MG PO TABS
1000.0000 mg | ORAL_TABLET | Freq: Every day | ORAL | Status: DC
  Administered 2018-07-09 – 2018-07-10 (×2): 1000 mg via ORAL
  Filled 2018-07-09 (×3): qty 2

## 2018-07-09 MED ORDER — LISINOPRIL 20 MG PO TABS
20.0000 mg | ORAL_TABLET | Freq: Every day | ORAL | Status: DC
  Filled 2018-07-09 (×2): qty 1

## 2018-07-09 MED ORDER — ONDANSETRON HCL 4 MG/2ML IJ SOLN: 4.0000 mg | Freq: Four times a day (QID) | INTRAMUSCULAR | Status: DC | PRN

## 2018-07-09 MED ORDER — LORAZEPAM 2 MG/ML IJ SOLN
0.5000 mg | Freq: Once | INTRAMUSCULAR | Status: AC
  Administered 2018-07-09: 0.5 mg via INTRAVENOUS
  Filled 2018-07-09: qty 1

## 2018-07-09 MED ORDER — OXYCODONE HCL 5 MG PO TABS
5.0000 mg | ORAL_TABLET | ORAL | Status: DC | PRN
  Administered 2018-07-09 – 2018-07-10 (×4): 5 mg via ORAL
  Filled 2018-07-09 (×4): qty 1

## 2018-07-09 MED ORDER — MORPHINE SULFATE (PF) 4 MG/ML IV SOLN
4.0000 mg | Freq: Once | INTRAVENOUS | Status: AC
  Administered 2018-07-09: 4 mg via INTRAVENOUS
  Filled 2018-07-09: qty 1

## 2018-07-09 MED ORDER — INSULIN DETEMIR 100 UNIT/ML ~~LOC~~ SOLN
28.0000 [IU] | Freq: Every day | SUBCUTANEOUS | Status: DC
  Administered 2018-07-10 – 2018-07-11 (×2): 28 [IU] via SUBCUTANEOUS
  Filled 2018-07-09 (×3): qty 0.28

## 2018-07-09 MED ORDER — INSULIN ASPART 100 UNIT/ML ~~LOC~~ SOLN
0.0000 [IU] | Freq: Three times a day (TID) | SUBCUTANEOUS | Status: DC
  Administered 2018-07-10 (×2): 1 [IU] via SUBCUTANEOUS
  Filled 2018-07-09: qty 1

## 2018-07-09 MED ORDER — CLONAZEPAM 0.5 MG PO TABS
1.0000 mg | ORAL_TABLET | Freq: Three times a day (TID) | ORAL | Status: DC
  Administered 2018-07-09 – 2018-07-11 (×7): 1 mg via ORAL
  Filled 2018-07-09 (×7): qty 2

## 2018-07-09 MED ORDER — HEPARIN SODIUM (PORCINE) 5000 UNIT/ML IJ SOLN
5000.0000 [IU] | Freq: Three times a day (TID) | INTRAMUSCULAR | Status: DC
  Administered 2018-07-09 – 2018-07-11 (×5): 5000 [IU] via SUBCUTANEOUS
  Filled 2018-07-09 (×5): qty 1

## 2018-07-09 MED ORDER — POLYETHYLENE GLYCOL 3350 17 G PO PACK: 17.0000 g | PACK | Freq: Every day | ORAL | Status: DC | PRN

## 2018-07-09 MED ORDER — SODIUM CHLORIDE 0.9 % IV SOLN
Freq: Once | INTRAVENOUS | Status: AC
  Administered 2018-07-09: 1000 mL/h via INTRAVENOUS

## 2018-07-09 MED ORDER — PANTOPRAZOLE SODIUM 40 MG PO TBEC
40.0000 mg | DELAYED_RELEASE_TABLET | Freq: Every day | ORAL | Status: DC
  Administered 2018-07-10 – 2018-07-11 (×2): 40 mg via ORAL
  Filled 2018-07-09 (×2): qty 1

## 2018-07-09 MED ORDER — AMLODIPINE BESYLATE 5 MG PO TABS
5.0000 mg | ORAL_TABLET | Freq: Every day | ORAL | Status: DC
  Filled 2018-07-09 (×2): qty 1

## 2018-07-09 MED ORDER — ACETAMINOPHEN 325 MG PO TABS
650.0000 mg | ORAL_TABLET | Freq: Four times a day (QID) | ORAL | Status: DC | PRN
  Administered 2018-07-11: 650 mg via ORAL
  Filled 2018-07-09: qty 2

## 2018-07-09 MED ORDER — SODIUM CHLORIDE 0.9 % IV SOLN
INTRAVENOUS | Status: DC
  Administered 2018-07-09: 18:00:00 via INTRAVENOUS
  Administered 2018-07-10: 22:00:00 100 mL/h via INTRAVENOUS
  Administered 2018-07-10: 08:00:00 via INTRAVENOUS
  Administered 2018-07-11: 05:00:00 100 mL/h via INTRAVENOUS

## 2018-07-09 MED ORDER — ONDANSETRON HCL 4 MG PO TABS
4.0000 mg | ORAL_TABLET | Freq: Four times a day (QID) | ORAL | Status: DC | PRN
  Administered 2018-07-09: 22:00:00 4 mg via ORAL
  Filled 2018-07-09: qty 1

## 2018-07-09 MED ORDER — PERPHENAZINE 4 MG PO TABS
8.0000 mg | ORAL_TABLET | Freq: Three times a day (TID) | ORAL | Status: DC
  Administered 2018-07-09 – 2018-07-11 (×6): 8 mg via ORAL
  Filled 2018-07-09 (×8): qty 2

## 2018-07-09 MED ORDER — ONDANSETRON HCL 4 MG/2ML IJ SOLN
4.0000 mg | Freq: Once | INTRAMUSCULAR | Status: AC
  Administered 2018-07-09: 23:00:00 4 mg via INTRAVENOUS
  Filled 2018-07-09: qty 2

## 2018-07-09 MED ORDER — VITAMIN D3 25 MCG (1000 UNIT) PO TABS
2000.0000 [IU] | ORAL_TABLET | Freq: Every day | ORAL | Status: DC
  Administered 2018-07-10 – 2018-07-11 (×2): 2000 [IU] via ORAL
  Filled 2018-07-09 (×3): qty 2

## 2018-07-09 MED ORDER — ACETAMINOPHEN 650 MG RE SUPP: 650.0000 mg | Freq: Four times a day (QID) | RECTAL | Status: DC | PRN

## 2018-07-09 MED ORDER — TRAZODONE HCL 50 MG PO TABS
100.0000 mg | ORAL_TABLET | Freq: Every day | ORAL | Status: DC
  Administered 2018-07-09 – 2018-07-10 (×2): 100 mg via ORAL
  Filled 2018-07-09 (×2): qty 2

## 2018-07-09 NOTE — ED Notes (Signed)
Bladder scan complete. Bladder scan hightest reading 273, average around 

## 2018-07-09 NOTE — H&P (Signed)
[facility] [place] [facility]   PATIENT NAME: Oscar ParadiseMark Duke    MR#:  [id number]  DATE OF BIRTH:  October 06, 1971  DATE OF ADMISSION:  07/09/2018  PRIMARY CARE PHYSICIAN: Duffy RhodyPartridge, Tanillya, FNP   REQUESTING/REFERRING PHYSICIAN:  Dr Mayford Knifewilliams  CHIEF COMPLAINT:   Dizziness and falls HISTORY OF PRESENT ILLNESS:  Oscar Duke  is a 47 y.o. male with a known history of diabetes and bipolar who presents to the ER due to dizziness and falls. Patient reports over the past 2 days he has had dizziness and lightheadedness and took a fall today.  He was having back pain was brought to the ER for further evaluation.  Patient is able to ambulate while in the emergency room.  He denies loss of consciousness or hitting his head. Patient takes Depakote for bipolar/schizophrenia. Patient denies nausea, vomiting or diarrhea.  Patient denies fever chills.  Patient has had decreased urine output over the past 24 hours.  Patient denies dysuria or frequency. He denies ataxia In the ER he was noted to have a sodium level of 123 and Depakote level is elevated. He does not drink EtOH  PAST MEDICAL HISTORY:   Past Medical History:  Diagnosis Date  . Bipolar 1 disorder (HCC)   . Diabetes mellitus without complication (HCC)   . Hyperlipidemia   . Hypertension   . 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 Schizophrenia, acute (HCC)   . Seizures (HCC)     PAST SURGICAL HISTORY:   Past Surgical History:  Procedure Laterality Date  . JOINT REPLACEMENT      SOCIAL HISTORY:   Social History   Tobacco Use  . Smoking status: Never Smoker  . Smokeless tobacco: Never Used  Substance Use Topics  . Alcohol use: No    FAMILY HISTORY:  No family history on file.  DRUG ALLERGIES:   Allergies  Allergen Reactions  . Haldol [Haloperidol Lactate]   . Inderal [Propranolol]   . Januvia [Sitagliptin] Other (See Comments)    Skin discoloration on lower legs  . Lithium  Other (See Comments)    Seizures    REVIEW OF SYSTEMS:   Review of Systems  Constitutional: Positive for malaise/fatigue. Negative for chills and fever.  HENT: Negative.  Negative for ear discharge, ear pain, hearing loss, nosebleeds and sore throat.   Eyes: Negative.  Negative for blurred vision and pain.  Respiratory: Negative.  Negative for cough, hemoptysis, shortness of breath and wheezing.   Cardiovascular: Negative.  Negative for chest pain, palpitations and leg swelling.  Gastrointestinal: Negative.  Negative for abdominal pain, blood in stool, diarrhea, nausea and vomiting.  Genitourinary: Negative.  Negative for dysuria.  Musculoskeletal: Positive for falls. Negative for back pain.  Skin: Negative.   Neurological: Positive for dizziness and weakness. Negative for tremors, speech change, focal weakness, seizures and headaches.  Endo/Heme/Allergies: Negative.  Does not bruise/bleed easily.  Psychiatric/Behavioral: Negative.  Negative for depression, hallucinations and suicidal ideas.    MEDICATIONS AT HOME:   Prior to Admission medications   Medication Sig Start Date End Date Taking? Authorizing Provider  acetaminophen (TYLENOL) 500 MG tablet Take 1 tablet (500 mg total) by mouth every 6 (six) hours as needed for mild pain, moderate pain, fever or headache. 05/28/18   [name]'Neal, Sarita, MD  amLODipine (NORVASC) 5 MG tablet Take 1 tablet (5 mg total) by mouth daily. 05/28/18   [name]'Neal, Sarita, MD  benztropine (COGENTIN) 1 MG tablet Take 1 tablet (1 mg  total) by mouth 2 (two) times daily. 05/28/18   [name], MD  Cholecalciferol (VITAMIN D3) 2000 units TABS Take 2,000 Units by mouth daily. 05/30/18   [name], MD  clonazePAM (KLONOPIN) 1 MG tablet Take 1 tablet (1 mg total) by mouth 3 (three) times daily. 05/28/18   [name], MD  divalproex (DEPAKOTE) 500 MG DR tablet Take 1 tablet (500 mg total) by mouth 2 (two) times daily. Take at 1 tab (500 mg) at 6am and 1 tab (500  mg) at 2pm. 05/28/18   [name], MD  divalproex (DEPAKOTE) 500 MG DR tablet Take 2 tablets (1,000 mg total) by mouth at bedtime. 05/28/18   [name], MD  ibuprofen (ADVIL,MOTRIN) 400 MG tablet Take 1 tablet (400 mg total) by mouth every 6 (six) hours as needed for fever, headache, mild pain, moderate pain or cramping. 05/28/18   [name], MD  insulin aspart (NOVOLOG) 100 UNIT/ML injection Inject 0-15 Units into the skin 3 (three) times daily with meals. CBG < 70: implement hypoglycemia protocol CBG 70 - 120: 0 units CBG 121 - 150: 2 units CBG 151 - 200: 3 units CBG 201 - 250: 5 units CBG 251 - 300: 8 units CBG 301 - 350: 11 units CBG 351 - 400: 15 units CBG > 400 call MD and obtain STAT lab verification 05/28/18   [name], MD  insulin detemir (LEVEMIR) 100 UNIT/ML injection Inject 0.28 mLs (28 Units total) into the skin daily. 05/30/18   [name], MD  lactulose (CHRONULAC) 10 GM/15ML solution Take 15 mLs (10 g total) by mouth 2 (two) times daily. 05/28/18   [name], MD  levETIRAcetam (KEPPRA) 1000 MG tablet Take 1 tablet (1,000 mg total) by mouth at bedtime. 05/28/18   [name], MD  lisinopril (PRINIVIL,ZESTRIL) 20 MG tablet Take 1 tablet (20 mg total) by mouth daily. 05/28/18   [name], MD  pantoprazole (PROTONIX) 40 MG tablet Take 1 tablet (40 mg total) by mouth daily. 05/28/18   [name], MD  perphenazine (TRILAFON) 8 MG tablet Take 1 tablet (8 mg total) by mouth 3 (three) times daily. 05/28/18   [name], MD  sodium chloride 1 g tablet Take 1 tablet (1 g total) by mouth daily. 05/28/18   [name], MD  traZODone (DESYREL) 100 MG tablet Take 1 tablet (100 mg total) by mouth at bedtime. 05/28/18   [name], MD      VITAL SIGNS:  Blood pressure 107/79, pulse 74, temperature 98.7 F (37.1 C), temperature source Oral, resp. rate 20, height 6\' 4"  (1.93 m), weight 110.7 kg, SpO2 97 %.  PHYSICAL EXAMINATION:   Physical Exam   Constitutional: He is oriented to person, place, and time. No distress.  HENT:  Head: Normocephalic.  Eyes: No scleral icterus.  Neck: Normal range of motion. Neck supple. No JVD present. No tracheal deviation present.  Cardiovascular: Normal rate, regular rhythm and normal heart sounds. Exam reveals no gallop and no friction rub.  No murmur heard. Pulmonary/Chest: Effort normal and breath sounds normal. No respiratory distress. He has no wheezes. He has no rales. He exhibits no tenderness.  Abdominal: Soft. Bowel sounds are normal. He exhibits no distension and no mass. There is no tenderness. There is no rebound and no guarding.  Musculoskeletal: Normal range of motion. He exhibits no edema.  Neurological: He is alert and oriented to person, place, and time. He displays abnormal reflex. No cranial nerve deficit or sensory deficit. He exhibits  normal muscle tone. Coordination normal.  Tremors noted  Skin: Skin is warm. No rash noted. No erythema.  Psychiatric: He has a normal mood and affect. Judgment normal.      LABORATORY PANEL:   CBC Recent Labs  Lab 07/09/18 1321  WBC 5.7  HGB 12.6*  HCT 35.4*  PLT 87*   ------------------------------------------------------------------------------------------------------------------  Chemistries  Recent Labs  Lab 07/09/18 1321  NA 123*  K 4.3  CL 91*  CO2 24  GLUCOSE 92  BUN 7  CREATININE 0.82  CALCIUM 8.9  AST 17  ALT 7  ALKPHOS 35*  BILITOT 0.6   ------------------------------------------------------------------------------------------------------------------  Cardiac Enzymes Recent Labs  Lab 07/09/18 1321  TROPONINI <0.03   ------------------------------------------------------------------------------------------------------------------  RADIOLOGY:  No results found.  EKG:  none  IMPRESSION AND PLAN:   47 year old male with a history of bipolar and diabetes who presents to the ER via EMS due to falls and  back pain.  1.  Hyponatremia: Start IV fluids Follow sodium level Nephrology consultation for further evaluation management Check TSH  2.  Falls with dizziness: Start IV fluids PT consultation  3.  Elevated ammonia level and thrombocytopenia with remote hx of occasional EtOh abuse: Increase lactulose Check abdominal ultrasound to evaluate for underlying liver cirrhosis  4.  Bipolar affective disorder/schizophrenia: Psychiatry consultation requested for evaluation of medications. Depakote level is elevated and therefore Depakote will be held for now Continue all other medications including Cogentin, Klonopin, trazodone 5.  Diabetes: Start sliding scale and ADA diet 6.  History of seizures: Continue Keppra  7.  Essential hypertension: Continue lisinopril and Norvasc   All the records are reviewed and case discussed with ED provider. Management plans discussed with the patient and he is in agreement  CODE STATUS: full  TOTAL TIME TAKING CARE OF THIS PATIENT: 48 minutes.    Brittnay Pigman M.D on 07/09/2018 at 2:57 PM  Between 7am to 6pm - Pager - [phone]  After 6pm go to [email or web address] - Social research officer, government  [facility]  Office  [phone]  CC: Primary care physician; Duffy Rhody, FNP

## 2018-07-09 NOTE — Consult Note (Signed)
[facility] Face-to-Face Psychiatry Consult   Reason for Consult: Consult for this 47 year old man with chronic mental illness who comes to the hospital with falls, dizziness and hyponatremia Referring Physician: Mody Patient Identification: Oscar Duke MRN:  [id number] Principal Diagnosis: Schizoaffective disorder, bipolar type [facility]) Diagnosis:   Patient Active Problem List   Diagnosis Date Noted  . Increased ammonia level [R79.89] 07/09/2018  . Hyponatremia [E87.1] 05/28/2018  . Schizoaffective disorder, bipolar type ([place]) [F25.0] 05/14/2018  . Somnolence [R40.0] 10/05/2017  . Adverse drug interaction with prescription medication [T50.905A] 10/05/2017  . Hypertension [I10]   . Diabetes mellitus without complication ([place]) [B71.6]   . Bipolar 1 disorder ([place]) [F31.9]   . Seizures ([place]) [R56.9]   . Schizophrenia, acute ([place]) [F23] 05/14/2017    Total Time spent with patient: 1 hour  Subjective:   Oscar Duke is a 47 y.o. male patient admitted with "I got hurt falling down".  HPI: Patient seen chart reviewed.  Patient came to the hospital complaining of an injury to his back sustained while falling.  The group home is reporting that he has had multiple falls recently and has seemed to be unsteady.  The patient confirms this saying that he thinks he is fallen about 6 times recently.  He seems to describe getting dizzy although he also says on one occasion he tripped.  Patient is uncertain whether any of his medicines have changed recently.  Psychiatrically he says he feels about the same as usual.  He will describe himself as feeling "depressed" about his mother who has been dead for several years but does not seem to be in a different emotional state than usual.  Denies auditory or visual hallucinations.  Denies suicidal thoughts does not make any homicidal or threatening statements.  Has complaints about his group home which are chronic but have not risen to the point of being  disruptive.  Medical history: Patient has a reported history of a seizure disorder and is on Keppra chronically.  History of hypertension.  He is hyponatremic currently which she was when he was discharged from the hospital last month as well.  Social history: Patient lives in a group home.  Does not sound like he has much social contact outside of it.  Substance abuse history: None  Past Psychiatric History: Long history of chronic mental illness.  Past history of some anger outbursts and aggression although that seems to have calm down recently.  No history of suicide attempts.  In the time we have seen this patient he has always been on Depakote as part of his regimen for managing schizoaffective disorder.  He used to be on Geodon and more recently is on Trilafon.  Risk to Self:   Risk to Others:   Prior Inpatient Therapy:   Prior Outpatient Therapy:    Past Medical History:  Past Medical History:  Diagnosis Date  . Bipolar 1 disorder ([place])   . Diabetes mellitus without complication ([place])   . Hyperlipidemia   . Hypertension   . Manic affective disorder with recurrent episode ([place])   . Schizophrenia, acute ([place])   . Seizures ([place])     Past Surgical History:  Procedure Laterality Date  . JOINT REPLACEMENT     Family History: History reviewed. No pertinent family history. Family Psychiatric  History: Does not know of any Social History:  Social History   Substance and Sexual Activity  Alcohol Use No     Social History   Substance and Sexual Activity  Drug Use Yes  . Types: Marijuana   Comment: quit 5 years ago    Social History   Socioeconomic History  . Marital status: Single    Spouse name: Not on file  . Number of children: Not on file  . Years of education: Not on file  . Highest education level: Not on file  Occupational History  . Not on file  Social Needs  . Financial resource strain: Not on file  . Food insecurity:    Worry: Not on file    Inability:  Not on file  . Transportation needs:    Medical: Not on file    Non-medical: Not on file  Tobacco Use  . Smoking status: Never Smoker  . Smokeless tobacco: Never Used  Substance and Sexual Activity  . Alcohol use: No  . Drug use: Yes    Types: Marijuana    Comment: quit 5 years ago  . Sexual activity: Not on file  Lifestyle  . Physical activity:    Days per week: Not on file    Minutes per session: Not on file  . Stress: Not on file  Relationships  . Social connections:    Talks on phone: Not on file    Gets together: Not on file    Attends religious service: Not on file    Active member of club or organization: Not on file    Attends meetings of clubs or organizations: Not on file    Relationship status: Not on file  Other Topics Concern  . Not on file  Social History Narrative  . Not on file   Additional Social History:    Allergies:   Allergies  Allergen Reactions  . Haldol [Haloperidol Lactate]   . Inderal [Propranolol]   . Januvia [Sitagliptin] Other (See Comments)    Skin discoloration on lower legs  . Lithium Other (See Comments)    Seizures    Labs:  Results for orders placed or performed during the hospital encounter of 07/09/18 (from the past 48 hour(s))  CBC with Differential     Status: Abnormal   Collection Time: 07/09/18  1:21 PM  Result Value Ref Range   WBC 5.7 3.8 - 10.6 K/uL   RBC 4.01 (L) 4.40 - 5.90 MIL/uL   Hemoglobin 12.6 (L) 13.0 - 18.0 g/dL   HCT 35.4 (L) 40.0 - 52.0 %   MCV 88.4 80.0 - 100.0 fL   MCH 31.5 26.0 - 34.0 pg   MCHC 35.6 32.0 - 36.0 g/dL   RDW 14.8 (H) 11.5 - 14.5 %   Platelets 87 (L) 150 - 440 K/uL   Neutrophils Relative % 68 %   Neutro Abs 4.0 1.4 - 6.5 K/uL   Lymphocytes Relative 19 %   Lymphs Abs 1.1 1.0 - 3.6 K/uL   Monocytes Relative 10 %   Monocytes Absolute 0.6 0.2 - 1.0 K/uL   Eosinophils Relative 2 %   Eosinophils Absolute 0.1 0 - 0.7 K/uL   Basophils Relative 1 %   Basophils Absolute 0.0 0 - 0.1 K/uL     Comment: Performed at [facility], [address] [place], [place] [address]  Comprehensive metabolic panel     Status: Abnormal   Collection Time: 07/09/18  1:21 PM  Result Value Ref Range   Sodium 123 (L) 135 - 145 mmol/L   Potassium 4.3 3.5 - 5.1 mmol/L   Chloride 91 (L) 98 - 111 mmol/L   CO2 24 22 - 32  mmol/L   Glucose, Bld 92 70 - 99 mg/dL   BUN 7 6 - 20 mg/dL   Creatinine, Ser 0.82 0.61 - 1.24 mg/dL   Calcium 8.9 8.9 - 10.3 mg/dL   Total Protein 6.7 6.5 - 8.1 g/dL   Albumin 3.9 3.5 - 5.0 g/dL   AST 17 15 - 41 U/L   ALT 7 0 - 44 U/L   Alkaline Phosphatase 35 (L) 38 - 126 U/L   Total Bilirubin 0.6 0.3 - 1.2 mg/dL   GFR calc non Af Amer >60 >60 mL/min   GFR calc Af Amer >60 >60 mL/min    Comment: (NOTE) The eGFR has been calculated using the CKD EPI equation. This calculation has not been validated in all clinical situations. eGFR's persistently <60 mL/min signify possible Chronic Kidney Disease.    Anion gap 8 5 - 15    Comment: Performed at [facility], [address] [place], [place] [address]  Troponin I     Status: None   Collection Time: 07/09/18  1:21 PM  Result Value Ref Range   Troponin I <0.03 <0.03 ng/mL    Comment: Performed at [facility], [address] [place], [place] [address]  Valproic acid level     Status: Abnormal   Collection Time: 07/09/18  1:21 PM  Result Value Ref Range   Valproic Acid Lvl 129 (H) 50.0 - 100.0 ug/mL    Comment: Performed at [facility], [address] [place], [place] [address]  TSH     Status: None   Collection Time: 07/09/18  1:21 PM  Result Value Ref Range   TSH 1.688 0.350 - 4.500 uIU/mL    Comment: Performed by a 3rd Generation assay with a functional sensitivity of <=0.01 uIU/mL. Performed at [facility], [address] [place], [place] [address]   Urinalysis, Complete w Microscopic     Status: Abnormal   Collection Time: 07/09/18  1:22 PM  Result Value Ref  Range   Color, Urine YELLOW (A) YELLOW   APPearance CLEAR (A) CLEAR   Specific Gravity, Urine 1.008 1.005 - 1.030   pH 7.0 5.0 - 8.0   Glucose, UA NEGATIVE NEGATIVE mg/dL   Hgb urine dipstick SMALL (A) NEGATIVE   Bilirubin Urine NEGATIVE NEGATIVE   Ketones, ur NEGATIVE NEGATIVE mg/dL   Protein, ur NEGATIVE NEGATIVE mg/dL   Nitrite NEGATIVE NEGATIVE   Leukocytes, UA NEGATIVE NEGATIVE   RBC / HPF 0-5 0 - 5 RBC/hpf   WBC, UA 0-5 0 - 5 WBC/hpf   Bacteria, UA NONE SEEN NONE SEEN   Squamous Epithelial / LPF 0-5 0 - 5    Comment: Performed at [facility], [address] [place], [place] [address]  Ammonia     Status: Abnormal   Collection Time: 07/09/18  1:22 PM  Result Value Ref Range   Ammonia 114 (H) 9 - 35 umol/L    Comment: Performed at [facility], [address] [place], [place] [address]    Current Facility-Administered Medications  Medication Dose Route Frequency Provider Last Rate Last Dose  . 0.9 %  sodium chloride infusion   Intravenous Continuous Mody, Sital, MD      . acetaminophen (TYLENOL) tablet 650 mg  650 mg Oral Q6H PRN Mody, Sital, MD       Or  . acetaminophen (TYLENOL) suppository 650 mg  650 mg Rectal Q6H PRN Bettey Costa, MD      . Derrill Memo ON  07/10/2018] amLODipine (NORVASC) tablet 5 mg  5 mg Oral Daily Mody, Sital, MD      . benztropine (COGENTIN) tablet 1 mg  1 mg Oral BID Bettey Costa, MD      . Derrill Memo ON 07/10/2018] cholecalciferol (VITAMIN D) tablet 2,000 Units  2,000 Units Oral Daily Mody, Sital, MD      . clonazePAM (KLONOPIN) tablet 1 mg  1 mg Oral TID Benjie Karvonen, Sital, MD      . heparin injection 5,000 Units  5,000 Units Subcutaneous Q8H Mody, Sital, MD      . insulin aspart (novoLOG) injection 0-5 Units  0-5 Units Subcutaneous QHS Mody, Sital, MD      . insulin aspart (novoLOG) injection 0-9 Units  0-9 Units Subcutaneous TID WC Bettey Costa, MD      . Derrill Memo ON 07/10/2018] insulin detemir (LEVEMIR) injection 28 Units  28 Units Subcutaneous  Daily Mody, Sital, MD      . lactulose (CHRONULAC) 10 GM/15ML solution 10 g  10 g Oral TID Bettey Costa, MD      . levETIRAcetam (KEPPRA) tablet 1,000 mg  1,000 mg Oral QHS Bettey Costa, MD      . Derrill Memo ON 07/10/2018] lisinopril (PRINIVIL,ZESTRIL) tablet 20 mg  20 mg Oral Daily Mody, Sital, MD      . ondansetron (ZOFRAN) tablet 4 mg  4 mg Oral Q6H PRN Mody, Sital, MD       Or  . ondansetron (ZOFRAN) injection 4 mg  4 mg Intravenous Q6H PRN Mody, Sital, MD      . oxyCODONE (Oxy IR/ROXICODONE) immediate release tablet 5 mg  5 mg Oral Q4H PRN Bettey Costa, MD      . Derrill Memo ON 07/10/2018] pantoprazole (PROTONIX) EC tablet 40 mg  40 mg Oral Daily Mody, Sital, MD      . perphenazine (TRILAFON) tablet 8 mg  8 mg Oral TID Mody, Sital, MD      . polyethylene glycol (MIRALAX / GLYCOLAX) packet 17 g  17 g Oral Daily PRN Mody, Sital, MD      . traZODone (DESYREL) tablet 100 mg  100 mg Oral QHS Bettey Costa, MD        Musculoskeletal: Strength & Muscle Tone: within normal limits Gait & Station: normal Patient leans: N/A  Psychiatric Specialty Exam: Physical Exam  Nursing note and vitals reviewed. Constitutional: He appears well-developed and well-nourished.  HENT:  Head: Normocephalic and atraumatic.  Eyes: Pupils are equal, round, and reactive to light. Conjunctivae are normal.  Neck: Normal range of motion.  Cardiovascular: Regular rhythm and normal heart sounds.  Respiratory: Effort normal. No respiratory distress.  GI: Soft.  Musculoskeletal: Normal range of motion.  Neurological: He is alert.  Skin: Skin is warm and dry.  Psychiatric: His affect is blunt. His speech is delayed. He is slowed. Thought content is not delusional. Cognition and memory are impaired. He expresses impulsivity. He expresses no homicidal and no suicidal ideation.    Review of Systems  Constitutional: Positive for malaise/fatigue.  HENT: Negative.   Eyes: Negative.   Respiratory: Negative.   Cardiovascular: Negative.    Gastrointestinal: Negative.   Musculoskeletal: Negative.   Skin: Negative.   Neurological: Positive for dizziness.  Psychiatric/Behavioral: Negative for depression, hallucinations, memory loss, substance abuse and suicidal ideas. The patient is not nervous/anxious and does not have insomnia.     Blood pressure 117/82, pulse 87, temperature 98.6 F (37 C), temperature source Oral, resp. rate 20, height '6\' 4"'$  (1.93 m), weight  110.7 kg, SpO2 100 %.Body mass index is 29.71 kg/m.  General Appearance: Casual  Eye Contact:  Fair  Speech:  Slow  Volume:  Decreased  Mood:  Euthymic  Affect:  Constricted  Thought Process:  Goal Directed  Orientation:  Full (Time, Place, and Person)  Thought Content:  Logical  Suicidal Thoughts:  No  Homicidal Thoughts:  No  Memory:  Immediate;   Fair Recent;   Fair Remote;   Fair  Judgement:  Fair  Insight:  Fair  Psychomotor Activity:  Decreased  Concentration:  Concentration: Fair  Recall:  [employer] of Knowledge:  Fair  Language:  Fair  Akathisia:  No  Handed:  Right  AIMS (if indicated):     Assets:  Desire for Improvement Housing Physical Health Resilience  ADL's:  Intact  Cognition:  Impaired,  Mild  Sleep:        Treatment Plan Summary: Daily contact with patient to assess and evaluate symptoms and progress in treatment, Medication management and Plan Patient not only is hyponatremic but also his ammonia level is very elevated, much higher than it was when he was here last time.  He also has an elevated Depakote level at 129.  All of this could contribute to dizziness, altered mental status and increased risk of falls.  Additionally he has been on chronic clonazepam at a pretty high dose which we might be able to cut back a little bit which also could decrease the risk of falls.  I am suggesting that we not restart the Depakote at this point.  Continue the Trilafon.  Cut down a little bit on the Klonopin.  I will follow-up as he is in the  hospital and see if this makes a difference in helping him improve and then reassess what we do with the Depakote or whether we try a different medication.  Disposition: No evidence of imminent risk to self or others at present.   Patient does not meet criteria for psychiatric inpatient admission. Supportive therapy provided about ongoing stressors. Discussed crisis plan, support from social network, calling 911, coming to the Emergency Department, and calling Suicide Hotline.  Alethia Berthold, MD 07/09/2018 4:29 PM

## 2018-07-09 NOTE — ED Triage Notes (Addendum)
[facility] with concerns over recently falls causing lower back pain. No falls today but reports multiple falls within the last few weeks resulting in lower back pain. Pt ambulates independently for ems, somewhat unsteady with maneuvering to wheelchair. Blood sugar 190 for ems.

## 2018-07-09 NOTE — ED Provider Notes (Signed)
[facility] Emergency Department Provider Note       Time seen: ----------------------------------------- 1:00 PM on 07/09/2018 -----------------------------------------   I have reviewed the triage vital signs and the nursing notes.  HISTORY   Chief Complaint Back Pain    HPI Oscar Duke is a 47 y.o. male with a history of bipolar disorder, diabetes, hyperlipidemia, hypertension, schizophrenia, seizures who presents to the ED for low back pain from recent falls.  Patient states he has been dizzy and falling in the past several weeks.  Caregivers at his family care home have concerns over recent falls abdomen causing his low back pain.  Patient walks independently for EMS before arrival.  He is complaining of diffuse upper and lower back pain.  Past Medical History:  Diagnosis Date  . Bipolar 1 disorder (HCC)   . Diabetes mellitus without complication (HCC)   . Hyperlipidemia   . Hypertension   . 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 Schizophrenia, acute (HCC)   . Seizures [facility](HCC)     Patient Active Problem List   Diagnosis Date Noted  . Hyponatremia 05/28/2018  . Schizoaffective disorder, bipolar type (HCC) 05/14/2018  . Somnolence 10/05/2017  . Adverse drug interaction with prescription medication 10/05/2017  . Hypertension   . Diabetes mellitus without complication (HCC)   . Bipolar 1 disorder (HCC)   . Seizures (HCC)   . Schizophrenia, acute (HCC) 05/14/2017    Past Surgical History:  Procedure Laterality Date  . JOINT REPLACEMENT      Allergies Haldol [haloperidol lactate]; Inderal [propranolol]; Januvia [sitagliptin]; and Lithium  Social History Social History   Tobacco Use  . Smoking status: Never Smoker  . Smokeless tobacco: Never Used  Substance Use Topics  . Alcohol use: No  . Drug use: Yes    Types: Marijuana    Comment: quit 5 years ago   Review of Systems Constitutional: Negative for  fever. Cardiovascular: Negative for chest pain. Respiratory: Negative for shortness of breath. Gastrointestinal: Negative for abdominal pain, vomiting and diarrhea. Musculoskeletal: Positive for back pain Skin: Negative for rash. Neurological: Positive for weakness and dizziness  All systems negative/normal/unremarkable except as stated in the HPI  ____________________________________________   PHYSICAL EXAM:  VITAL SIGNS: ED Triage Vitals [07/09/18 1251]  Enc Vitals Group     BP 107/79     Pulse Rate 74     Resp 20     Temp 98.7 F (37.1 C)     Temp Source Oral     SpO2 97 %     Weight 244 lb 0.8 oz (110.7 kg)     Height 6\' 4"  (1.93 m)     Head Circumference      Peak Flow      Pain Score      Pain Loc      Pain Edu?      Excl. in GC?    Constitutional: Alert and oriented, mild distress Eyes: Conjunctivae are normal. Normal extraocular movements. ENT   Head: Normocephalic and atraumatic.   Nose: No congestion/rhinnorhea.   Mouth/Throat: Mucous membranes are moist.   Neck: No stridor. Cardiovascular: Normal rate, regular rhythm. No murmurs, rubs, or gallops. Respiratory: Normal respiratory effort without tachypnea nor retractions. Breath sounds are clear and equal bilaterally. No wheezes/rales/rhonchi. Gastrointestinal: Soft and nontender. Normal bowel sounds Musculoskeletal: Nontender with normal range of motion in extremities. No lower extremity tenderness nor edema.  No obvious crossed or straight leg raise examination Neurologic:  Normal speech and language. No gross focal neurologic deficits are appreciated.  Tremulous Skin:  Skin is warm, dry and intact.  Jaundice is noted Psychiatric: Mood and affect are normal. Speech and behavior are normal.  ____________________________________________  ED COURSE:  As part of my medical decision making, I reviewed the following data within the electronic MEDICAL RECORD NUMBER History obtained from family if  available, nursing notes, old chart and ekg, as well as notes from prior ED visits. Patient presented for weakness, dizziness and with back pain after falls, we will assess with labs and imaging as indicated at this time. Clinical Course as of Jul 09 1429  Wed Jul 09, 2018  1417 Sodium(!): 123 [JW]  1417 Ammonia(!): 114 [JW]  1428 Valproic Acid,S(!): 129 [JW]    Clinical Course User Index [JW] Emily Filbert, MD   Procedures ____________________________________________   LABS (pertinent positives/negatives)  Labs Reviewed  CBC WITH DIFFERENTIAL/PLATELET - Abnormal; Notable for the following components:      Result Value   RBC 4.01 (*)    Hemoglobin 12.6 (*)    HCT 35.4 (*)    RDW 14.8 (*)    Platelets 87 (*)    All other components within normal limits  COMPREHENSIVE METABOLIC PANEL - Abnormal; Notable for the following components:   Sodium 123 (*)    Chloride 91 (*)    Alkaline Phosphatase 35 (*)    All other components within normal limits  AMMONIA - Abnormal; Notable for the following components:   Ammonia 114 (*)    All other components within normal limits  VALPROIC ACID LEVEL - Abnormal; Notable for the following components:   Valproic Acid Lvl 129 (*)    All other components within normal limits  TROPONIN I  URINALYSIS, COMPLETE (UACMP) WITH MICROSCOPIC  CBG MONITORING, ED    RADIOLOGY Images were viewed by me  Thoracic and lumbar spine x-rays Do not reveal any acute process ____________________________________________  DIFFERENTIAL DIAGNOSIS   Medication side effect, dehydration, electrolyte abnormality, hyperammonemia, occult infection, fracture  FINAL ASSESSMENT AND PLAN  Dizziness, likely secondary to hyponatremia, hyperammonemia and/or mild Depakote toxicity   Plan: The patient had presented for dizziness with frequent falls and resulting back pain and dizziness. Patient's labs did reveal multiple abnormalities including acute on chronic  hyponatremia, elevated Depakote level and elevated ammonia level.  Some combination of these abnormalities are likely why he has been dizzy and falling.  Patient's imaging not reveal any acute process.  We have started him on saline infusion.  He will likely require observation.   Ulice Dash, MD   Note: This note was generated in part or whole with voice recognition software. Voice recognition is usually quite accurate but there are transcription errors that can and very often do occur. I apologize for any typographical errors that were not detected and corrected.     Emily Filbert, MD 07/09/18 [phone]

## 2018-07-09 NOTE — ED Notes (Addendum)
Attempted to contact Oscar Duke  Who is listed under active FYI with no success.

## 2018-07-10 ENCOUNTER — Inpatient Hospital Stay: Payer: Medicare Other

## 2018-07-10 LAB — COMPREHENSIVE METABOLIC PANEL
ALBUMIN: 3.7 g/dL (ref 3.5–5.0)
ALT: 8 U/L (ref 0–44)
AST: 15 U/L (ref 15–41)
Alkaline Phosphatase: 33 U/L — ABNORMAL LOW (ref 38–126)
Anion gap: 9 (ref 5–15)
BUN: 6 mg/dL (ref 6–20)
CHLORIDE: 89 mmol/L — AB (ref 98–111)
CO2: 25 mmol/L (ref 22–32)
Calcium: 8.6 mg/dL — ABNORMAL LOW (ref 8.9–10.3)
Creatinine, Ser: 0.67 mg/dL (ref 0.61–1.24)
GFR calc Af Amer: >60 mL/min (ref >60)
GFR calc non Af Amer: >60 mL/min (ref >60)
GLUCOSE: 108 mg/dL — AB (ref 70–99)
POTASSIUM: 4.2 mmol/L (ref 3.5–5.1)
Sodium: 123 mmol/L — ABNORMAL LOW (ref 135–145)
Total Bilirubin: 0.7 mg/dL (ref 0.3–1.2)
Total Protein: 6.4 g/dL — ABNORMAL LOW (ref 6.5–8.1)

## 2018-07-10 LAB — MRSA PCR SCREENING: MRSA by PCR: NEGATIVE

## 2018-07-10 LAB — GLUCOSE, CAPILLARY
GLUCOSE-CAPILLARY: 135 mg/dL — AB (ref 70–99)
Glucose-Capillary: 90 mg/dL (ref 70–99)
Glucose-Capillary: 94 mg/dL (ref 70–99)
Glucose-Capillary: 95 mg/dL (ref 70–99)

## 2018-07-10 LAB — SODIUM
SODIUM: 123 mmol/L — AB (ref 135–145)
SODIUM: 122 mmol/L — AB (ref 135–145)
SODIUM: 126 mmol/L — AB (ref 135–145)

## 2018-07-10 LAB — OSMOLALITY: OSMOLALITY: 255 mosm/kg — AB (ref 275–295)

## 2018-07-10 LAB — VALPROIC ACID LEVEL: VALPROIC ACID LVL: 88 ug/mL (ref 50.0–100.0)

## 2018-07-10 LAB — MAGNESIUM: Magnesium: 1.4 mg/dL — ABNORMAL LOW (ref 1.7–2.4)

## 2018-07-10 MED ORDER — MAGNESIUM SULFATE 4 GM/100ML IV SOLN
4.0000 g | Freq: Once | INTRAVENOUS | Status: AC
  Administered 2018-07-10: 4 g via INTRAVENOUS
  Filled 2018-07-10: qty 100

## 2018-07-10 NOTE — Clinical Social Work Note (Signed)
Clinical Social Work Assessment  Patient Details  Name: Oscar Duke MRN: [id number] Date of Birth: [birth year]  Date of referral:  07/10/18               Reason for consult:  Facility Placement                Permission sought to share information with:  Case Manager, Customer service manager, Family Supports Permission granted to share information::  Yes, Verbal Permission Granted  Name::        Agency::     Relationship::     Contact Information:     Housing/Transportation Living arrangements for the past 2 months:  Group Home Source of Information:  Patient Patient Interpreter Needed:  None Criminal Activity/Legal Involvement Pertinent to Current Situation/Hospitalization:  No - Comment as needed Significant Relationships:  Other Family Members, Friend Lives with:  Facility Resident Do you feel safe going back to the place where you live?  Yes Need for family participation in patient care:  Yes (Comment)  Care giving concerns:  Patient is from [facility].   Social Worker assessment / plan:  CSW consulted for facility placement. CSW met with patient to discuss discharge plan. CSW introduced self and explained role. Patient states that he lives in a group home but is unable to tell CSW the name of the home. Patient unable to tell how long he has been at the group home. Patient reports that he would like to return to group home when ready for discharge. CSW left voicemail for patient's Oscar Duke [phone] and currently awaiting a return phone call. CSW spoke with staff member at [facility] who confirmed that patient is a resident there. CSW will continue to follow for discharge planning.   Employment status:  Disabled (Comment on whether or not currently receiving Disability) Insurance information:  Medicare PT Recommendations:  Not assessed at this time Information / Referral to community resources:     Patient/Family's Response to care:   Patient thanked CSW for assistance   Patient/Family's Understanding of and Emotional Response to Diagnosis, Current Treatment, and Prognosis:  Patient does not fully understand diagnoses. He is in agreement with discharge plan.  Emotional Assessment Appearance:  Appears stated age Attitude/Demeanor/Rapport:  Engaged Affect (typically observed):  Pleasant, Accepting Orientation:  Oriented to Self, Oriented to Place, Oriented to  Time Alcohol / Substance use:  Not Applicable Psych involvement (Current and /or in the community):  Yes (Comment)  Discharge Needs  Concerns to be addressed:  Discharge Planning Concerns Readmission within the last 30 days:  No Current discharge risk:  None Barriers to Discharge:  Continued Medical Work up   [employer], [place] 07/10/2018, 9:58 AM

## 2018-07-10 NOTE — Clinical Social Work Note (Signed)
Patient's HCPOA Randell LoopDavid Herring called CSW back to inquire about patient. CSW explained patient is admitted currently. HCPOA states [facility]. CSW will continue to follow for discharge planning.   Ruthe Mannanandace Jereme Loren MSW, [address]CSWA [phone]

## 2018-07-10 NOTE — Evaluation (Signed)
Physical Therapy Evaluation Patient Details Name: Oscar Duke MRN: [id number] DOB: [birth year] Today's Date: 07/10/2018   History of Present Illness  presented to ER secondary to dizziness, falls and back pain; admitted with acute hyponatremia.  T-spine and L-spine imaging negative for acute injury.  Clinical Impression  Upon evaluation, patient alert and oriented to basic information; follows commands, but requires increased time for processing.  Mildly impulsive with limited insight/safety awareness.  Bilat UE/LE strength and ROM grossly symmetrical and WFL for basic transfers and mobility; mod pain/soreness reported in back (imaging negative for acute injury).  Able to complete bed mobility with sup; sit/stand, basic transfers and very short-distance gait (bed/chair) without assist device, cga/close sup.  Increased sway in A/P plane, cga for balance and safety.  Decreased eccentric control with stand to sit, requiring UE support to control descent.  Refused further gait distances/trial due to fatigue and waiting on breakfast.  Will continue to progress in subsequent sessions as patient medically appropriate and agreeable. Generally hypotensive throughout session, with mild increase in reports of dizziness with transition to upright (supine BP 108/73, HR 55; sitting BP 96/70, HR 66; standing BP 91/66, HR 67).  RN informed/aware. Would benefit from skilled PT to address above deficits and promote optimal return to PLOF; Recommend transition to HHPT upon discharge from acute hospitalization.     Follow Up Recommendations Home health PT    Equipment Recommendations  Rolling walker with 5" wheels    Recommendations for Other Services       Precautions / Restrictions Precautions Precautions: Fall Restrictions Weight Bearing Restrictions: No      Mobility  Bed Mobility Overal bed mobility: Needs Assistance Bed Mobility: Supine to Sit     Supine to sit: Supervision         Transfers Overall transfer level: Needs assistance Equipment used: Rolling walker (2 wheeled) Transfers: Sit to/from Stand Sit to Stand: Min guard         General transfer comment: requires UE support to assist with lift off  Ambulation/Gait Ambulation/Gait assistance: Min guard Gait Distance (Feet): 5 Feet Assistive device: None       General Gait Details: fair step height/length, decreased cadence; mild sway in A/P plane, but no overt buckling or LOB.  Decreased eccentric control with stand to sit (require UE support to safely complete).  Patient refused further gait distance/trials due to fatigue, general malaise  Stairs            Wheelchair Mobility    Modified Rankin (Stroke Patients Only)       Balance Overall balance assessment: Needs assistance Sitting-balance support: No upper extremity supported;Feet supported Sitting balance-Leahy Scale: Good     Standing balance support: Bilateral upper extremity supported Standing balance-Leahy Scale: Fair                               Pertinent Vitals/Pain Pain Assessment: Faces Faces Pain Scale: Hurts little more Pain Location: back Pain Descriptors / Indicators: Aching;Grimacing;Guarding Pain Intervention(s): Limited activity within patient's tolerance;Monitored during session;Repositioned    Home Living Family/patient expects to be discharged to:: Assisted living               Home Equipment: None      Prior Function Level of Independence: Independent         Comments: Indep without assist device for mobility; does endorse multiple fall history, increased in frequency over recent weeks  Hand Dominance        Extremity/Trunk Assessment   Upper Extremity Assessment Upper Extremity Assessment: Overall WFL for tasks assessed    Lower Extremity Assessment Lower Extremity Assessment: Overall WFL for tasks assessed(grossly 4-/5 throughout bilat LEs)        Communication   Communication: No difficulties  Cognition Arousal/Alertness: Awake/alert Behavior During Therapy: WFL for tasks assessed/performed Overall Cognitive Status: Within Functional Limits for tasks assessed                                 General Comments: delayed processing      General Comments      Exercises     Assessment/Plan    PT Assessment Patient needs continued PT services  PT Problem List Decreased strength;Decreased activity tolerance;Decreased balance;Decreased mobility;Decreased coordination;Decreased cognition;Decreased knowledge of use of DME;Decreased safety awareness;Decreased knowledge of precautions       PT Treatment Interventions DME instruction;Gait training;Stair training;Functional mobility training;Therapeutic activities;Therapeutic exercise;Balance training;Patient/family education    PT Goals (Current goals can be found in the Care Plan section)  Acute Rehab PT Goals Patient Stated Goal: to get my breakfast PT Goal Formulation: With patient Time For Goal Achievement: 07/24/18 Potential to Achieve Goals: Good    Frequency Min 2X/week   Barriers to discharge Decreased caregiver support      Co-evaluation               AM-PAC PT "6 Clicks" Daily Activity  Outcome Measure Difficulty turning over in bed (including adjusting bedclothes, sheets and blankets)?: A Little Difficulty moving from lying on back to sitting on the side of the bed? : A Little Difficulty sitting down on and standing up from a chair with arms (e.g., wheelchair, bedside commode, etc,.)?: Unable Help needed moving to and from a bed to chair (including a wheelchair)?: A Little Help needed walking in hospital room?: A Little Help needed climbing 3-5 steps with a railing? : A Little 6 Click Score: 16    End of Session Equipment Utilized During Treatment: Gait belt Activity Tolerance: Patient tolerated treatment well Patient left: in chair;with  call bell/phone within reach;with chair alarm set;with nursing/sitter in room Nurse Communication: Mobility status PT Visit Diagnosis: Muscle weakness (generalized) (M62.81);Difficulty in walking, not elsewhere classified (R26.2);Repeated falls (R29.6)    Time: 0950-1007 PT Time Calculation (min) (ACUTE ONLY): 17 min   Charges:   PT Evaluation $PT Eval Low Complexity: 1 Low         Roger Fasnacht H. Manson PasseyBrown, PT, DPT, NCS 07/10/18, 10:54 AM [phone]

## 2018-07-10 NOTE — [place] FL2 (Signed)
[place] MEDICAID FL2 LEVEL OF CARE SCREENING TOOL     IDENTIFICATION  Patient Name: Oscar Duke Birthdate: 1971/05/22 Sex: male Admission Date (Current Location): 07/09/2018  [place] and [place] Number:  Chiropodist and Address:  [facility], [address], [place], [place] [address]      Provider Number: [id number]  Attending Physician Name and Address:  Shaune Pollack, MD  Relative Name and Phone Number:  Randell Loop- [facility] [phone]    Current Level of Care: Hospital Recommended Level of Care: Family Care Home Prior Approval Number:    Date Approved/Denied:   PASRR Number:    Discharge Plan: Other (Comment)(Family Care Home )    Current Diagnoses: Patient Active Problem List   Diagnosis Date Noted  . Increased ammonia level 07/09/2018  . Hyponatremia 05/28/2018  . Schizoaffective disorder, bipolar type (HCC) 05/14/2018  . Somnolence 10/05/2017  . Adverse drug interaction with prescription medication 10/05/2017  . Hypertension   . Diabetes mellitus without complication (HCC)   . Bipolar 1 disorder (HCC)   . Seizures (HCC)   . Schizophrenia, acute (HCC) 05/14/2017    Orientation RESPIRATION BLADDER Height & Weight     Self, Time, Place  Normal Continent Weight: 244 lb 0.8 oz (110.7 kg) Height:  6\' 4"  (193 cm)  BEHAVIORAL SYMPTOMS/MOOD NEUROLOGICAL BOWEL NUTRITION STATUS  (None) (None ) Continent Diet(NPO to be advanced )  AMBULATORY STATUS COMMUNICATION OF NEEDS Skin   Limited Assist Verbally Normal                       Personal Care Assistance Level of Assistance  Bathing, Feeding, Dressing Bathing Assistance: Limited assistance Feeding assistance: Independent Dressing Assistance: Independent     Functional Limitations Info  Sight, Hearing, Speech Sight Info: Adequate Hearing Info: Adequate Speech Info: Adequate    SPECIAL CARE FACTORS FREQUENCY                       Contractures  Contractures Info: Not present    Additional Factors Info  Code Status, Allergies Code Status Info: Full Code  Allergies Info: Haldol Haloperidol Lactate, Inderal Propranolol, Januvia Sitagliptin, Lithium           Current Medications (07/10/2018):  This is the current hospital active medication list Current Facility-Administered Medications  Medication Dose Route Frequency Provider Last Rate Last Dose  . 0.9 %  sodium chloride infusion   Intravenous Continuous Adrian Saran, MD 75 mL/hr at 07/10/18 0734    . acetaminophen (TYLENOL) tablet 650 mg  650 mg Oral Q6H PRN Adrian Saran, MD       Or  . acetaminophen (TYLENOL) suppository 650 mg  650 mg Rectal Q6H PRN Mody, Sital, MD      . amLODipine (NORVASC) tablet 5 mg  5 mg Oral Daily Mody, Sital, MD      . benztropine (COGENTIN) tablet 1 mg  1 mg Oral BID Adrian Saran, MD   1 mg at 07/09/18 2135  . cholecalciferol (VITAMIN D) tablet 2,000 Units  2,000 Units Oral Daily Mody, Sital, MD      . clonazePAM (KLONOPIN) tablet 1 mg  1 mg Oral TID Adrian Saran, MD   1 mg at 07/09/18 2133  . heparin injection 5,000 Units  5,000 Units Subcutaneous Q8H Adrian Saran, MD   5,000 Units at 07/10/18 0441  . insulin aspart (novoLOG) injection 0-5 Units  0-5 Units Subcutaneous QHS Adrian Saran, MD      .  insulin aspart (novoLOG) injection 0-9 Units  0-9 Units Subcutaneous TID WC Mody, Sital, MD      . insulin detemir (LEVEMIR) injection 28 Units  28 Units Subcutaneous Daily Mody, Sital, MD      . lactulose (CHRONULAC) 10 GM/15ML solution 10 g  10 g Oral TID Adrian SaranMody, Sital, MD   10 g at 07/09/18 2140  . levETIRAcetam (KEPPRA) tablet 1,000 mg  1,000 mg Oral QHS Adrian SaranMody, Sital, MD   1,000 mg at 07/09/18 2134  . lisinopril (PRINIVIL,ZESTRIL) tablet 20 mg  20 mg Oral Daily Mody, Sital, MD      . ondansetron (ZOFRAN) tablet 4 mg  4 mg Oral Q6H PRN Adrian SaranMody, Sital, MD   4 mg at 07/09/18 2132   Or  . ondansetron (ZOFRAN) injection 4 mg  4 mg Intravenous Q6H PRN Mody, Sital, MD       . oxyCODONE (Oxy IR/ROXICODONE) immediate release tablet 5 mg  5 mg Oral Q4H PRN Adrian SaranMody, Sital, MD   5 mg at 07/10/18 0750  . pantoprazole (PROTONIX) EC tablet 40 mg  40 mg Oral Daily Mody, Sital, MD      . perphenazine (TRILAFON) tablet 8 mg  8 mg Oral TID Adrian SaranMody, Sital, MD   8 mg at 07/09/18 2135  . polyethylene glycol (MIRALAX / GLYCOLAX) packet 17 g  17 g Oral Daily PRN Adrian SaranMody, Sital, MD      . traZODone (DESYREL) tablet 100 mg  100 mg Oral QHS Adrian SaranMody, Sital, MD   100 mg at 07/09/18 2134     Discharge Medications: Please see discharge summary for a list of discharge medications.  Relevant Imaging Results:  Relevant Lab Results:   Additional Information    Alamin Mccuiston  Rinaldo RatelGarrison, [address]CSWA

## 2018-07-10 NOTE — Plan of Care (Signed)

## 2018-07-10 NOTE — Consult Note (Signed)
[facility]  CONSULT NOTE    Date: 07/10/2018                  Patient Name:  Oscar Duke  MRN: [id number]  DOB: [birth year]  Age / Sex: 47 y.o., male         PCP: Duffy RhodyPartridge, Tanillya, FNP                 Service Requesting Consult: Dr. Juliene PinaMody                 Reason for Consult: Hyponatremia            History of Present Illness: Mr. Oscar CrownMark A Wierzbicki is a 47 y.o. Hispanic male with schizophrenia/bipolar disorder, hypertension, diabetes mellitus type II, seizure disorder, hyperlipidemia, who was [facility]2018 for Dizziness [R42] Hyponatremia [E87.1] Hyperammonemia (HCC) [E72.20] Pain [R52] Valproic acid toxicity, accidental or unintentional, initial encounter [T42.6X1A]  Patient states he fell and brought to ED where serum sodium was 123. He was started on NS and this morning, sodium is still 123. Nephrology consulted. Last serum sodium was 127 from 7/17. Records show sodium being 128 in 04/2017  Patient endorses drinking a great deal. [facility].   Patient was started on salt tablets at that time.   Medications: Outpatient medications: Medications Prior to Admission  Medication Sig Dispense Refill Last Dose  . amLODipine (NORVASC) 5 MG tablet Take 1 tablet (5 mg total) by mouth daily. 30 tablet 0 07/09/2018 at Unknown time  . benztropine (COGENTIN) 1 MG tablet Take 1 tablet (1 mg total) by mouth 2 (two) times daily. 60 tablet 0 07/09/2018 at Unknown time  . Cholecalciferol (VITAMIN D3) 2000 units TABS Take 2,000 Units by mouth daily. 30 tablet 0 07/09/2018 at Unknown time  . clonazePAM (KLONOPIN) 1 MG tablet Take 1 tablet (1 mg total) by mouth 3 (three) times daily. 90 tablet 0 07/09/2018 at Unknown time  . divalproex (DEPAKOTE) 500 MG DR tablet Take 2 tablets (1,000 mg total) by mouth at bedtime. (Patient taking differently: Take 500 mg by mouth at bedtime. ) 60 tablet 0  07/08/2018 at Unknown time  . insulin detemir (LEVEMIR) 100 UNIT/ML injection Inject 0.28 mLs (28 Units total) into the skin daily. 10 mL 0 07/09/2018 at Unknown time  . lactulose (CHRONULAC) 10 GM/15ML solution Take 15 mLs (10 g total) by mouth 2 (two) times daily. 900 mL 0 07/09/2018 at Unknown time  . levETIRAcetam (KEPPRA) 1000 MG tablet Take 1 tablet (1,000 mg total) by mouth at bedtime. 30 tablet 0 07/08/2018 at Unknown time  . lisinopril (PRINIVIL,ZESTRIL) 20 MG tablet Take 1 tablet (20 mg total) by mouth daily. 30 tablet 0 07/09/2018 at Unknown time  . pantoprazole (PROTONIX) 40 MG tablet Take 1 tablet (40 mg total) by mouth daily. 30 tablet 0 07/09/2018 at Unknown time  . perphenazine (TRILAFON) 8 MG tablet Take 1 tablet (8 mg total) by mouth 3 (three) times daily. 90 tablet 0 07/09/2018 at Unknown time  . sodium chloride 1 g tablet Take 1 tablet (1 g total) by mouth daily. 30 tablet 0 07/09/2018 at Unknown time  . traZODone (DESYREL) 100 MG tablet Take 1 tablet (100 mg total) by mouth at bedtime. 30 tablet 0 07/08/2018 at Unknown time  . acetaminophen (TYLENOL) 500 MG tablet Take 1 tablet (500 mg total) by mouth every 6 (six) hours as needed for  mild pain, moderate pain, fever or headache. 30 tablet 0 PRN at PRN  . divalproex (DEPAKOTE) 500 MG DR tablet Take 1 tablet (500 mg total) by mouth 2 (two) times daily. Take at 1 tab (500 mg) at 6am and 1 tab (500 mg) at 2pm. (Patient not taking: Reported on 07/09/2018) 60 tablet 0 Not Taking at Unknown time  . ibuprofen (ADVIL,MOTRIN) 400 MG tablet Take 1 tablet (400 mg total) by mouth every 6 (six) hours as needed for fever, headache, mild pain, moderate pain or cramping. 30 tablet 0 PRN at PRN  . insulin aspart (NOVOLOG) 100 UNIT/ML injection Inject 0-15 Units into the skin 3 (three) times daily with meals. CBG < 70: implement hypoglycemia protocol CBG 70 - 120: 0 units CBG 121 - 150: 2 units CBG 151 - 200: 3 units CBG 201 - 250: 5 units CBG 251 - 300: 8  units CBG 301 - 350: 11 units CBG 351 - 400: 15 units CBG > 400 call MD and obtain STAT lab verification (Patient not taking: Reported on 07/09/2018) 13.5 mL 0 Not Taking at Unknown time    Current medications: Current Facility-Administered Medications  Medication Dose Route Frequency Provider Last Rate Last Dose  . 0.9 %  sodium chloride infusion   Intravenous Continuous Shaune Pollack, MD 100 mL/hr at 07/10/18 1011    . acetaminophen (TYLENOL) tablet 650 mg  650 mg Oral Q6H PRN Adrian Saran, MD       Or  . acetaminophen (TYLENOL) suppository 650 mg  650 mg Rectal Q6H PRN Mody, Sital, MD      . amLODipine (NORVASC) tablet 5 mg  5 mg Oral Daily Mody, Sital, MD      . benztropine (COGENTIN) tablet 1 mg  1 mg Oral BID Adrian Saran, MD   1 mg at 07/10/18 1012  . cholecalciferol (VITAMIN D) tablet 2,000 Units  2,000 Units Oral Daily Adrian Saran, MD   2,000 Units at 07/10/18 1012  . clonazePAM (KLONOPIN) tablet 1 mg  1 mg Oral TID Adrian Saran, MD   1 mg at 07/10/18 1012  . heparin injection 5,000 Units  5,000 Units Subcutaneous Q8H Adrian Saran, MD   5,000 Units at 07/10/18 0441  . insulin aspart (novoLOG) injection 0-5 Units  0-5 Units Subcutaneous QHS Mody, Sital, MD      . insulin aspart (novoLOG) injection 0-9 Units  0-9 Units Subcutaneous TID WC Mody, Sital, MD      . insulin detemir (LEVEMIR) injection 28 Units  28 Units Subcutaneous Daily Adrian Saran, MD   28 Units at 07/10/18 1014  . lactulose (CHRONULAC) 10 GM/15ML solution 10 g  10 g Oral TID Adrian Saran, MD   10 g at 07/10/18 1013  . levETIRAcetam (KEPPRA) tablet 1,000 mg  1,000 mg Oral QHS Adrian Saran, MD   1,000 mg at 07/09/18 2134  . lisinopril (PRINIVIL,ZESTRIL) tablet 20 mg  20 mg Oral Daily Mody, Sital, MD      . magnesium sulfate IVPB 4 g 100 mL  4 g Intravenous Once Shaune Pollack, MD 50 mL/hr at 07/10/18 1020 4 g at 07/10/18 1020  . ondansetron (ZOFRAN) tablet 4 mg  4 mg Oral Q6H PRN Adrian Saran, MD   4 mg at 07/09/18 2132   Or  .  ondansetron (ZOFRAN) injection 4 mg  4 mg Intravenous Q6H PRN Mody, Sital, MD      . oxyCODONE (Oxy IR/ROXICODONE) immediate release tablet 5 mg  5 mg Oral Q4H PRN  Adrian Saran, MD   5 mg at 07/10/18 0750  . pantoprazole (PROTONIX) EC tablet 40 mg  40 mg Oral Daily Mody, Sital, MD   40 mg at 07/10/18 1013  . perphenazine (TRILAFON) tablet 8 mg  8 mg Oral TID Adrian Saran, MD   8 mg at 07/10/18 1021  . polyethylene glycol (MIRALAX / GLYCOLAX) packet 17 g  17 g Oral Daily PRN Adrian Saran, MD      . traZODone (DESYREL) tablet 100 mg  100 mg Oral QHS Adrian Saran, MD   100 mg at 07/09/18 2134      Allergies: Allergies  Allergen Reactions  . Haldol [Haloperidol Lactate]   . Inderal [Propranolol]   . Januvia [Sitagliptin] Other (See Comments)    Skin discoloration on lower legs  . Lithium Other (See Comments)    Seizures      Past Medical History: Past Medical History:  Diagnosis Date  . Bipolar 1 disorder (HCC)   . Diabetes mellitus without complication (HCC)   . Hyperlipidemia   . Hypertension   . 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 Schizophrenia, acute (HCC)   . Seizures (HCC)      Past Surgical History: Past Surgical History:  Procedure Laterality Date  . JOINT REPLACEMENT       Family History: History reviewed. No pertinent family history.   Social History: Social History   Socioeconomic History  . Marital status: Single    Spouse name: Not on file  . Number of children: Not on file  . Years of education: Not on file  . Highest education level: Not on file  Occupational History  . Not on file  Social Needs  . Financial resource strain: Not on file  . Food insecurity:    Worry: Not on file    Inability: Not on file  . Transportation needs:    Medical: Not on file    Non-medical: Not on file  Tobacco Use  . Smoking status: Never Smoker  . Smokeless tobacco: Never Used  Substance and Sexual Activity  . Alcohol use: No  . Drug use:  Yes    Types: Marijuana    Comment: quit 5 years ago  . Sexual activity: Not on file  Lifestyle  . Physical activity:    Days per week: Not on file    Minutes per session: Not on file  . Stress: Not on file  Relationships  . Social connections:    Talks on phone: Not on file    Gets together: Not on file    Attends religious service: Not on file    Active member of club or organization: Not on file    Attends meetings of clubs or organizations: Not on file    Relationship status: Not on file  . Intimate partner violence:    Fear of current or ex partner: Not on file    Emotionally abused: Not on file    Physically abused: Not on file    Forced sexual activity: Not on file  Other Topics Concern  . Not on file  Social History Narrative  . Not on file     Review of Systems: Review of Systems  Constitutional: Negative.  Negative for chills, diaphoresis, fever, malaise/fatigue and weight loss.  HENT: Negative.  Negative for congestion, ear discharge, ear pain, hearing loss, nosebleeds, sinus pain, sore throat and tinnitus.   Eyes: Negative.  Negative for blurred vision, double vision, photophobia, pain, discharge  and redness.  Respiratory: Negative.  Negative for cough, hemoptysis, sputum production, shortness of breath, wheezing and stridor.   Cardiovascular: Negative.  Negative for chest pain, palpitations, orthopnea, claudication, leg swelling and PND.  Gastrointestinal: Negative.  Negative for abdominal pain, blood in stool, constipation, diarrhea, heartburn, melena, nausea and vomiting.  Genitourinary: Negative.  Negative for dysuria, flank pain, frequency, hematuria and urgency.  Musculoskeletal: Negative.  Negative for back pain, falls, joint pain, myalgias and neck pain.  Skin: Negative.  Negative for itching and rash.  Neurological: Negative.  Negative for dizziness, tingling, tremors, sensory change, speech change, focal weakness, seizures, loss of consciousness, weakness  and headaches.  Endo/Heme/Allergies: Negative.  Negative for environmental allergies and polydipsia. Does not bruise/bleed easily.  Psychiatric/Behavioral: Positive for depression. Negative for hallucinations, memory loss, substance abuse and suicidal ideas. The patient is not nervous/anxious and does not have insomnia.     Vital Signs: Blood pressure 104/68, pulse (!) 56, temperature 97.7 F (36.5 C), temperature source Oral, resp. rate 16, height 6\' 4"  (1.93 m), weight 110.7 kg, SpO2 100 %.  Weight trends: Filed Weights   07/09/18 1251  Weight: 110.7 kg    Physical Exam: General: NAD  Head: Normocephalic, atraumatic. Moist oral mucosal membranes  Eyes: Anicteric, PERRL  Neck: Supple, trachea midline  Lungs:  Clear to auscultation  Heart: Regular rate and rhythm  Abdomen:  Soft, nontender,   Extremities:  no peripheral edema.  Neurologic: Nonfocal, moving all four extremities  Skin: No lesions     Lab results: Basic Metabolic Panel: Recent Labs  Lab 07/09/18 1321 07/10/18 0318 07/10/18 1127  NA 123* 123* 123*  K 4.3 4.2  --   CL 91* 89*  --   CO2 24 25  --   GLUCOSE 92 108*  --   BUN 7 6  --   CREATININE 0.82 0.67  --   CALCIUM 8.9 8.6*  --   MG  --  1.4*  --     Liver Function Tests: Recent Labs  Lab 07/09/18 1321 07/10/18 0318  AST 17 15  ALT 7 8  ALKPHOS 35* 33*  BILITOT 0.6 0.7  PROT 6.7 6.4*  ALBUMIN 3.9 3.7   No results for input(s): LIPASE, AMYLASE in the last 168 hours. Recent Labs  Lab 07/09/18 1322  AMMONIA 114*    CBC: Recent Labs  Lab 07/09/18 1321  WBC 5.7  NEUTROABS 4.0  HGB 12.6*  HCT 35.4*  MCV 88.4  PLT 87*    Cardiac Enzymes: Recent Labs  Lab 07/09/18 1321  TROPONINI <0.03    BNP: Invalid input(s): POCBNP  CBG: Recent Labs  Lab 07/09/18 1651 07/09/18 2131 07/10/18 0759  GLUCAP 87 101* 90    Microbiology: Results for orders placed or performed during the hospital encounter of 07/09/18  MRSA PCR  Screening     Status: None   Collection Time: 07/10/18  4:39 AM  Result Value Ref Range Status   MRSA by PCR NEGATIVE NEGATIVE Final    Comment:        The GeneXpert MRSA Assay (FDA approved for NASAL specimens only), is one component of a comprehensive MRSA colonization surveillance program. It is not intended to diagnose MRSA infection nor to guide or monitor treatment for MRSA infections. Performed at [facility], [address] [place], [place] [address]     Coagulation Studies: No results for input(s): LABPROT, INR in the last 72 hours.  Urinalysis: Recent Labs    07/09/18 1322  COLORURINE YELLOW*  LABSPEC 1.008  PHURINE 7.0  GLUCOSEU NEGATIVE  HGBUR SMALL*  BILIRUBINUR NEGATIVE  KETONESUR NEGATIVE  PROTEINUR NEGATIVE  NITRITE NEGATIVE  LEUKOCYTESUR NEGATIVE      Imaging: Dg Thoracic Spine 2 View  Result Date: 07/09/2018 CLINICAL DATA:  47 year old presenting with multiple recent falls causing mid and low back pain. Initial encounter. EXAM: THORACIC SPINE 2 VIEWS COMPARISON:  No prior thoracic spine imaging. Visualized lumbar spine on chest x-ray 10/05/2017 is correlated. FINDINGS: Twelve rib-bearing thoracic vertebrae with anatomic alignment. No fractures. Well-preserved disc spaces throughout. Bridging ANTERIOR and LATERAL osteophytes involving the mid and LOWER thoracic spine. Pedicles intact. Note is made of mild degenerative disc disease at C5-6 on the swimmer's view. IMPRESSION: 1. No acute osseous abnormality. 2. Degenerative changes involving the mid and LOWER thoracic spine. 3. Mild degenerative disc disease at C5-6. Electronically Signed   By: Hulan Saas M.D.   On: 07/09/2018 15:14   Dg Lumbar Spine 2-3 Views  Result Date: 07/09/2018 CLINICAL DATA:  47 year old presenting with multiple recent falls causing mid and low back pain. Initial encounter. EXAM: LUMBAR SPINE - 2-3 VIEW COMPARISON:  03/12/2018. FINDINGS: Five non-rib-bearing lumbar  vertebrae with anatomic alignment. No fractures. Mild disc space narrowing at L1-2, L2-3 and minimal disc space narrowing at L3-4, unchanged. ANTERIOR osteophytes arising from the UPPER and LOWER endplate of L4, unchanged. No new abnormalities. IMPRESSION: 1. No acute osseous abnormality. 2. Mild degenerative disc disease at L1-2 and L2-3 and minimal degenerative disc disease at L3-4, stable since May, 2019. Electronically Signed   By: Hulan Saas M.D.   On: 07/09/2018 15:12      Assessment & Plan: Mr. HYLAND MOLLENKOPF is a 47 y.o. Hispanic male with schizophrenia/bipolar disorder, hypertension, diabetes mellitus type II, seizure disorder, hyperlipidemia, who was admitted to [facility] on 07/09/2018 for Dizziness [R42] Hyponatremia [E87.1] Hyperammonemia (HCC) [E72.20] Pain [R52] Valproic acid toxicity, accidental or unintentional, initial encounter [T42.6X1A]  1. Hyponatremia: acute on chronic hyponatremia. Euvolemic on examination. Urine specific gravity is lower end of normal. History is suggestive of psychogenic polydipsia. Which would explain why Normal saline infusion is not working.  - Continue q6 serum sodium - If no improvement, change to a fluid restriction and possible salt tablet.   2. Hypertension: blood pressure is 104/68. Home regimen of amlodipine, lisinopril Not on a thiazide diuretic.  3. Diabetes mellitus type II insulin dependent: hemoglobin A1c of 5.6% 05/15/18 No evidence of renal involvement on urinalysis.   LOS: 1 Reia Viernes 8/29/201912:03 PM

## 2018-07-10 NOTE — Progress Notes (Signed)
[employer] -  at [facility]   PATIENT NAME: Oscar Duke    MR#:  [id number]  DATE OF BIRTH:  [birth year]  SUBJECTIVE:  CHIEF COMPLAINT:   Chief Complaint  Patient presents with  . Back Pain   Generalized weakness.  He said he drinks a lot of water in facility. REVIEW OF SYSTEMS:  Review of Systems  Constitutional: Positive for malaise/fatigue. Negative for chills and fever.  HENT: Negative for sore throat.   Eyes: Negative for blurred vision and double vision.  Respiratory: Negative for cough, hemoptysis, shortness of breath, wheezing and stridor.   Cardiovascular: Negative for chest pain, palpitations, orthopnea and leg swelling.  Gastrointestinal: Negative for abdominal pain, blood in stool, diarrhea, melena, nausea and vomiting.  Genitourinary: Negative for dysuria, flank pain and hematuria.  Musculoskeletal: Negative for back pain and joint pain.  Skin: Negative for rash.  Neurological: Negative for dizziness, sensory change, focal weakness, seizures, loss of consciousness, weakness and headaches.  Endo/Heme/Allergies: Negative for polydipsia.  Psychiatric/Behavioral: Negative for depression. The patient is not nervous/anxious.     DRUG ALLERGIES:   Allergies  Allergen Reactions  . Haldol [Haloperidol Lactate]   . Inderal [Propranolol]   . Januvia [Sitagliptin] Other (See Comments)    Skin discoloration on lower legs  . Lithium Other (See Comments)    Seizures   VITALS:  Blood pressure 104/68, pulse (!) 56, temperature 97.7 F (36.5 C), temperature source Oral, resp. rate 16, height 6\' 4"  (1.93 m), weight 110.7 kg, SpO2 100 %. PHYSICAL EXAMINATION:  Physical Exam  Constitutional: He is oriented to person, place, and time. He appears well-developed.  HENT:  Head: Normocephalic.  Mouth/Throat: Oropharynx is clear and moist.  Eyes: Pupils are equal, round, and reactive to light. Conjunctivae and EOM are normal. No scleral icterus.    Neck: Normal range of motion. Neck supple. No JVD present. No tracheal deviation present.  Cardiovascular: Normal rate, regular rhythm and normal heart sounds. Exam reveals no gallop.  No murmur heard. Pulmonary/Chest: Effort normal and breath sounds normal. No respiratory distress. He has no wheezes. He has no rales.  Abdominal: Soft. Bowel sounds are normal. He exhibits no distension. There is no tenderness. There is no rebound.  Musculoskeletal: Normal range of motion. He exhibits no edema or tenderness.  Neurological: He is alert and oriented to person, place, and time. No cranial nerve deficit.  Skin: No rash noted. No erythema.   LABORATORY PANEL:  Male CBC Recent Labs  Lab 07/09/18 1321  WBC 5.7  HGB 12.6*  HCT 35.4*  PLT 87*   ------------------------------------------------------------------------------------------------------------------ Chemistries  Recent Labs  Lab 07/10/18 0318 07/10/18 1127  NA 123* 123*  K 4.2  --   CL 89*  --   CO2 25  --   GLUCOSE 108*  --   BUN 6  --   CREATININE 0.67  --   CALCIUM 8.6*  --   MG 1.4*  --   AST 15  --   ALT 8  --   ALKPHOS 33*  --   BILITOT 0.7  --    RADIOLOGY:  Dg Thoracic Spine 2 View  Result Date: 07/09/2018 CLINICAL DATA:  [age] year old presenting with multiple recent falls causing mid and low back pain. Initial encounter. EXAM: THORACIC SPINE 2 VIEWS COMPARISON:  No prior thoracic spine imaging. Visualized lumbar spine on chest x-ray 10/05/2017 is correlated. FINDINGS: Twelve rib-bearing thoracic vertebrae with anatomic alignment. No fractures. Well-preserved disc spaces throughout. Bridging ANTERIOR  and LATERAL osteophytes involving the mid and LOWER thoracic spine. Pedicles intact. Note is made of mild degenerative disc disease at C5-6 on the swimmer's view. IMPRESSION: 1. No acute osseous abnormality. 2. Degenerative changes involving the mid and LOWER thoracic spine. 3. Mild degenerative disc disease at C5-6.  Electronically Signed   By: Hulan Saas M.D.   On: 07/09/2018 15:14   Dg Lumbar Spine 2-3 Views  Result Date: 07/09/2018 CLINICAL DATA:  47 year old presenting with multiple recent falls causing mid and low back pain. Initial encounter. EXAM: LUMBAR SPINE - 2-3 VIEW COMPARISON:  03/12/2018. FINDINGS: Five non-rib-bearing lumbar vertebrae with anatomic alignment. No fractures. Mild disc space narrowing at L1-2, L2-3 and minimal disc space narrowing at L3-4, unchanged. ANTERIOR osteophytes arising from the UPPER and LOWER endplate of L4, unchanged. No new abnormalities. IMPRESSION: 1. No acute osseous abnormality. 2. Mild degenerative disc disease at L1-2 and L2-3 and minimal degenerative disc disease at L3-4, stable since May, 2019. Electronically Signed   By: Hulan Saas M.D.   On: 07/09/2018 15:12   ASSESSMENT AND PLAN:   47 year old male with a history of bipolar and diabetes who presents to the ER via EMS due to falls and back pain.  1.  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 Continue normal saline IV, follow-up sodium level every 6 hours per Dr. Wynelle Link.  2.  Falls with dizziness: PT evaluation.  3.  Elevated ammonia level and thrombocytopenia with remote hx of occasional EtOh abuse: Increased lactulose Check abdominal ultrasound to evaluate for underlying liver cirrhosis, follow-up ammonia level.  Hold Depakote.  4.  Bipolar affective disorder/schizophrenia: Psychiatry consultation requested for evaluation of medications. Depakote level is elevated and Depakote is held. Continue all other medications including Cogentin, Klonopin, trazodone  5.  Diabetes: on sliding scale and ADA diet, continue Levemir. 6.  History of seizures: Continue Keppra 7.  Essential hypertension: Continue lisinopril and Norvasc  Hypomagnesemia.  Given magnesium IV and follow-up level.  Discussed with Dr. Wynelle Link. All the records are reviewed and case discussed with Care  Management/Social Worker. Management plans discussed with the patient, family and they are in agreement.  CODE STATUS: Full Code  TOTAL TIME TAKING CARE OF THIS PATIENT: 36 minutes.   More than 50% of the time was spent in counseling/coordination of care: YES  POSSIBLE D/C IN 2 DAYS, DEPENDING ON CLINICAL CONDITION.   Shaune Pollack M.D on 07/10/2018 at 12:50 PM  Between 7am to 6pm - Pager - [phone]  After 6pm go to [email or web address] - Therapist, nutritional Hospitalists

## 2018-07-11 ENCOUNTER — Other Ambulatory Visit: Payer: Self-pay

## 2018-07-11 LAB — GLUCOSE, CAPILLARY
GLUCOSE-CAPILLARY: 90 mg/dL (ref 70–99)
Glucose-Capillary: 103 mg/dL — ABNORMAL HIGH (ref 70–99)
Glucose-Capillary: 107 mg/dL — ABNORMAL HIGH (ref 70–99)

## 2018-07-11 LAB — BASIC METABOLIC PANEL
Anion gap: 7 (ref 5–15)
BUN: 11 mg/dL (ref 6–20)
CO2: 26 mmol/L (ref 22–32)
Calcium: 8.7 mg/dL — ABNORMAL LOW (ref 8.9–10.3)
Chloride: 97 mmol/L — ABNORMAL LOW (ref 98–111)
Creatinine, Ser: 0.75 mg/dL (ref 0.61–1.24)
GFR calc Af Amer: >60 mL/min (ref >60)
GFR calc non Af Amer: >60 mL/min (ref >60)
Glucose, Bld: 96 mg/dL (ref 70–99)
Potassium: 4.5 mmol/L (ref 3.5–5.1)
Sodium: 130 mmol/L — ABNORMAL LOW (ref 135–145)

## 2018-07-11 LAB — AMMONIA: Ammonia: 38 umol/L — ABNORMAL HIGH (ref 9–35)

## 2018-07-11 LAB — MAGNESIUM: MAGNESIUM: 1.8 mg/dL (ref 1.7–2.4)

## 2018-07-11 MED ORDER — DIVALPROEX SODIUM 500 MG PO DR TAB
500.0000 mg | DELAYED_RELEASE_TABLET | Freq: Every day | ORAL | Status: DC
  Filled 2018-07-11: qty 1

## 2018-07-11 MED ORDER — DIVALPROEX SODIUM 500 MG PO DR TAB: 500.0000 mg | DELAYED_RELEASE_TABLET | Freq: Every day | ORAL | Status: DC

## 2018-07-11 MED ORDER — PERPHENAZINE 4 MG PO TABS: 8.0000 mg | ORAL_TABLET | Freq: Two times a day (BID) | ORAL | Status: DC

## 2018-07-11 NOTE — Progress Notes (Signed)
Clinical Social Worker (CSW) [facility] [phone] and spoke to staff member Lucendia HerrlichFaye. Per Lucendia HerrlichFaye patient can return to the group home when stable and they will likely arrange a taxi to come pick him [facility]. CSW will continue to follow and assist as needed.   [employer] Hughes IncorporatedBailey Jevon Shells, LCSW [phone]

## 2018-07-11 NOTE — Discharge Instructions (Signed)
he is strongly encouraged to be careful not to drink excessive water.  HHPT

## 2018-07-11 NOTE — Progress Notes (Addendum)
Patient is medically stable for D/[facility] can return today and they will call a taxi to come pick him up. The family care home will pay for the taxi. Clinical Child psychotherapistocial Worker (CSW) will send D/C summary and FL2 with patient as requested by Lucendia HerrlichFaye. RN aware of above and will call report to family care home. Patient is aware of above. CSW left patient's HPOA Casimiro NeedleMichael a Engineer, technical salesvoicemail. RN case manager will arrange home health. Please reconsult if future social work needs arise. CSW signing off.   [employer]orporatedBailey Gage Treiber, LCSW [phone]

## 2018-07-11 NOTE — Consult Note (Signed)
[facility] Consult   Reason for Consult: Follow-up for this 47 year old man with schizoaffective disorder and a history of brain injury. Referring Physician: Verdell Carmine Patient Identification: Oscar Duke MRN:  [id number] Principal Diagnosis: Schizoaffective disorder, bipolar type [facility]) Diagnosis:   Patient Active Problem List   Diagnosis Date Noted  . Increased ammonia level [R79.89] 07/09/2018  . Hyponatremia [E87.1] 05/28/2018  . Schizoaffective disorder, bipolar type ([place]) [F25.0] 05/14/2018  . Somnolence [R40.0] 10/05/2017  . Adverse drug interaction with prescription medication [T50.905A] 10/05/2017  . Hypertension [I10]   . Diabetes mellitus without complication ([place]) [J19.4]   . Bipolar 1 disorder ([place]) [F31.9]   . Seizures ([place]) [R56.9]   . Schizophrenia, acute ([place]) [F23] 05/14/2017    Total Time spent with patient: 30 minutes  Subjective:   Oscar Duke is a 47 y.o. male patient admitted with "I think I am feeling better".  HPI: Patient seen.  Chart reviewed.  Looks like the most likely cause of his hyponatremia was water intoxication and that overall this is probably the most likely cause of his general unsteadiness when he came into the hospital.  Physically he is feeling much better.  Eating more normally.  No longer unsteady on his feet or feeling like he is going to fall.  Patient's mood currently is calm.  He still has some chronic confusion and disorganized thoughts but denies any current suicidal or homicidal ideation.  Past Psychiatric History: Long-standing history of mental health problems.  Has had several prior hospitalizations.  Does have a past history of dangerousness.  Depakote and antipsychotics had long been an important part of his stability  Risk to Self:   Risk to Others:   Prior Inpatient Therapy:   Prior Outpatient Therapy:    Past Medical History:  Past Medical History:  Diagnosis Date  . Bipolar 1 disorder ([place])   . Diabetes  mellitus without complication ([place])   . Hyperlipidemia   . Hypertension   . Manic affective disorder with recurrent episode ([place])   . Schizophrenia, acute ([place])   . Seizures ([place])     Past Surgical History:  Procedure Laterality Date  . JOINT REPLACEMENT     Family History: History reviewed. No pertinent family history. Family Psychiatric  History: None identified Social History:  Social History   Substance and Sexual Activity  Alcohol Use No     Social History   Substance and Sexual Activity  Drug Use Yes  . Types: Marijuana   Comment: quit 5 years ago    Social History   Socioeconomic History  . Marital status: Single    Spouse name: Not on file  . Number of children: Not on file  . Years of education: Not on file  . Highest education level: Not on file  Occupational History  . Not on file  Social Needs  . Financial resource strain: Not on file  . Food insecurity:    Worry: Not on file    Inability: Not on file  . Transportation needs:    Medical: Not on file    Non-medical: Not on file  Tobacco Use  . Smoking status: Never Smoker  . Smokeless tobacco: Never Used  Substance and Sexual Activity  . Alcohol use: No  . Drug use: Yes    Types: Marijuana    Comment: quit 5 years ago  . Sexual activity: Not on file  Lifestyle  . Physical activity:    Days per week: Not on file  Minutes per session: Not on file  . Stress: Not on file  Relationships  . Social connections:    Talks on phone: Not on file    Gets together: Not on file    Attends religious service: Not on file    Active member of club or organization: Not on file    Attends meetings of clubs or organizations: Not on file    Relationship status: Not on file  Other Topics Concern  . Not on file  Social History Narrative  . Not on file   Additional Social History:    Allergies:   Allergies  Allergen Reactions  . Haldol [Haloperidol Lactate]   . Inderal [Propranolol]   . Januvia  [Sitagliptin] Other (See Comments)    Skin discoloration on lower legs  . Lithium Other (See Comments)    Seizures    Labs:  Results for orders placed or performed during the hospital encounter of 07/09/18 (from the past 48 hour(s))  Glucose, capillary     Status: None   Collection Time: 07/09/18  4:51 PM  Result Value Ref Range   Glucose-Capillary 87 70 - 99 mg/dL  Glucose, capillary     Status: Abnormal   Collection Time: 07/09/18  9:31 PM  Result Value Ref Range   Glucose-Capillary 101 (H) 70 - 99 mg/dL  Valproic acid level     Status: None   Collection Time: 07/10/18  3:18 AM  Result Value Ref Range   Valproic Acid Lvl 88 50.0 - 100.0 ug/mL    Comment: Performed at [facility], [address] [place], [place] [address]  Comprehensive metabolic panel     Status: Abnormal   Collection Time: 07/10/18  3:18 AM  Result Value Ref Range   Sodium 123 (L) 135 - 145 mmol/L   Potassium 4.2 3.5 - 5.1 mmol/L   Chloride 89 (L) 98 - 111 mmol/L   CO2 25 22 - 32 mmol/L   Glucose, Bld 108 (H) 70 - 99 mg/dL   BUN 6 6 - 20 mg/dL   Creatinine, Ser 0.67 0.61 - 1.24 mg/dL   Calcium 8.6 (L) 8.9 - 10.3 mg/dL   Total Protein 6.4 (L) 6.5 - 8.1 g/dL   Albumin 3.7 3.5 - 5.0 g/dL   AST 15 15 - 41 U/L   ALT 8 0 - 44 U/L   Alkaline Phosphatase 33 (L) 38 - 126 U/L   Total Bilirubin 0.7 0.3 - 1.2 mg/dL   GFR calc non Af Amer >60 >60 mL/min   GFR calc Af Amer >60 >60 mL/min    Comment: (NOTE) The eGFR has been calculated using the CKD EPI equation. This calculation has not been validated in all clinical situations. eGFR's persistently <60 mL/min signify possible Chronic Kidney Disease.    Anion gap 9 5 - 15    Comment: Performed at [facility], [address] [place], [place] [address]  Magnesium     Status: Abnormal   Collection Time: 07/10/18  3:18 AM  Result Value Ref Range   Magnesium 1.4 (L) 1.7 - 2.4 mg/dL    Comment: Performed at [facility], [address] [place], [place] [address]  MRSA PCR Screening     Status: None   Collection Time: 07/10/18  4:39 AM  Result Value Ref Range   MRSA by PCR NEGATIVE NEGATIVE    Comment:        The GeneXpert MRSA Assay (FDA approved for NASAL specimens only),  is one component of a comprehensive MRSA colonization surveillance program. It is not intended to diagnose MRSA infection nor to guide or monitor treatment for MRSA infections. Performed at [facility], [address] [place], [place] [address]   Glucose, capillary     Status: None   Collection Time: 07/10/18  7:59 AM  Result Value Ref Range   Glucose-Capillary 90 70 - 99 mg/dL  Sodium     Status: Abnormal   Collection Time: 07/10/18 11:27 AM  Result Value Ref Range   Sodium 123 (L) 135 - 145 mmol/L    Comment: Performed at [facility], [address] [place], [place] [address]  Osmolality     Status: Abnormal   Collection Time: 07/10/18 11:27 AM  Result Value Ref Range   Osmolality 255 (L) 275 - 295 mOsm/kg    Comment: Performed at [facility], [place] [address], [place], [place] [address]  Glucose, capillary     Status: Abnormal   Collection Time: 07/10/18 11:54 AM  Result Value Ref Range   Glucose-Capillary 135 (H) 70 - 99 mg/dL  Glucose, capillary     Status: None   Collection Time: 07/10/18  4:55 PM  Result Value Ref Range   Glucose-Capillary 94 70 - 99 mg/dL  Sodium     Status: Abnormal   Collection Time: 07/10/18  4:57 PM  Result Value Ref Range   Sodium 122 (L) 135 - 145 mmol/L    Comment: Performed at [facility], [address] [place], [place] [address]  Glucose, capillary     Status: None   Collection Time: 07/10/18  9:24 PM  Result Value Ref Range   Glucose-Capillary 95 70 - 99 mg/dL  Sodium     Status: Abnormal   Collection Time: 07/10/18 11:07 PM  Result Value Ref Range   Sodium 126 (L) 135 - 145 mmol/L    Comment: Performed at [facility], [address] [place], [place] [address]  Basic metabolic panel     Status: Abnormal   Collection Time: 07/11/18  5:07 AM  Result Value Ref Range   Sodium 130 (L) 135 - 145 mmol/L   Potassium 4.5 3.5 - 5.1 mmol/L   Chloride 97 (L) 98 - 111 mmol/L   CO2 26 22 - 32 mmol/L   Glucose, Bld 96 70 - 99 mg/dL   BUN 11 6 - 20 mg/dL   Creatinine, Ser 0.75 0.61 - 1.24 mg/dL   Calcium 8.7 (L) 8.9 - 10.3 mg/dL   GFR calc non Af Amer >60 >60 mL/min   GFR calc Af Amer >60 >60 mL/min    Comment: (NOTE) The eGFR has been calculated using the CKD EPI equation. This calculation has not been validated in all clinical situations. eGFR's persistently <60 mL/min signify possible Chronic Kidney Disease.    Anion gap 7 5 - 15    Comment: Performed at [facility], [address] [place], [place] [address]  Ammonia     Status: Abnormal   Collection Time: 07/11/18  5:07 AM  Result Value Ref Range   Ammonia 38 (H) 9 - 35 umol/L    Comment: Performed at [facility], [address] [place], [place] [address]  Magnesium     Status: None   Collection Time: 07/11/18  5:07 AM  Result Value Ref Range   Magnesium 1.8 1.7 - 2.4 mg/dL    Comment: Performed at [facility], [address] [place], [place] [address]  Glucose, capillary     Status: None   Collection Time: 07/11/18  7:51 AM  Result Value Ref Range   Glucose-Capillary 90 70 - 99 mg/dL  Glucose, capillary     Status: Abnormal   Collection Time: 07/11/18 11:44 AM  Result Value Ref Range   Glucose-Capillary 103 (H) 70 - 99 mg/dL    Current Facility-Administered Medications  Medication Dose Route Frequency Provider Last Rate Last Dose  . acetaminophen (TYLENOL) tablet 650 mg  650 mg Oral Q6H PRN Bettey Costa, MD   650 mg at 07/11/18 1512   Or  . acetaminophen (TYLENOL) suppository 650 mg  650 mg Rectal Q6H PRN Mody, Sital, MD      . amLODipine (NORVASC) tablet 5 mg  5 mg Oral Daily Mody, Sital, MD      . benztropine (COGENTIN)  tablet 1 mg  1 mg Oral BID Bettey Costa, MD   1 mg at 07/11/18 0915  . cholecalciferol (VITAMIN D) tablet 2,000 Units  2,000 Units Oral Daily Bettey Costa, MD   2,000 Units at 07/11/18 0915  . clonazePAM (KLONOPIN) tablet 1 mg  1 mg Oral TID Bettey Costa, MD   1 mg at 07/11/18 1507  . divalproex (DEPAKOTE) DR tablet 500 mg  500 mg Oral QHS Britt Theard T, MD      . heparin injection 5,000 Units  5,000 Units Subcutaneous Q8H Bettey Costa, MD   5,000 Units at 07/11/18 0514  . insulin aspart (novoLOG) injection 0-5 Units  0-5 Units Subcutaneous QHS Mody, Sital, MD      . insulin aspart (novoLOG) injection 0-9 Units  0-9 Units Subcutaneous TID WC Bettey Costa, MD   1 Units at 07/10/18 1812  . insulin detemir (LEVEMIR) injection 28 Units  28 Units Subcutaneous Daily Bettey Costa, MD   28 Units at 07/11/18 0914  . lactulose (CHRONULAC) 10 GM/15ML solution 10 g  10 g Oral TID Bettey Costa, MD   10 g at 07/11/18 1507  . levETIRAcetam (KEPPRA) tablet 1,000 mg  1,000 mg Oral QHS Bettey Costa, MD   1,000 mg at 07/10/18 2126  . lisinopril (PRINIVIL,ZESTRIL) tablet 20 mg  20 mg Oral Daily Mody, Sital, MD      . ondansetron (ZOFRAN) tablet 4 mg  4 mg Oral Q6H PRN Bettey Costa, MD   4 mg at 07/09/18 2132   Or  . ondansetron (ZOFRAN) injection 4 mg  4 mg Intravenous Q6H PRN Mody, Sital, MD      . oxyCODONE (Oxy IR/ROXICODONE) immediate release tablet 5 mg  5 mg Oral Q4H PRN Bettey Costa, MD   5 mg at 07/10/18 1544  . pantoprazole (PROTONIX) EC tablet 40 mg  40 mg Oral Daily Bettey Costa, MD   40 mg at 07/11/18 0915  . perphenazine (TRILAFON) tablet 8 mg  8 mg Oral TID Bettey Costa, MD   8 mg at 07/11/18 1507  . polyethylene glycol (MIRALAX / GLYCOLAX) packet 17 g  17 g Oral Daily PRN Bettey Costa, MD      . traZODone (DESYREL) tablet 100 mg  100 mg Oral QHS Bettey Costa, MD   100 mg at 07/10/18 2146    Musculoskeletal: Strength & Muscle Tone: within normal limits Gait & Station: normal Patient leans: N/A  Psychiatric  Specialty Exam: Physical Exam  Nursing note and vitals reviewed. Constitutional: He appears well-developed and well-nourished.  HENT:  Head: Normocephalic and atraumatic.  Eyes: Pupils are equal, round, and  reactive to light. Conjunctivae are normal.  Neck: Normal range of motion.  Cardiovascular: Regular rhythm and normal heart sounds.  Respiratory: Effort normal.  GI: Soft.  Musculoskeletal: Normal range of motion.  Neurological: He is alert.  Skin: Skin is warm and dry.  Psychiatric: His speech is normal and behavior is normal. Thought content normal. His affect is blunt. He expresses impulsivity. He exhibits abnormal recent memory.    Review of Systems  Constitutional: Negative.   HENT: Negative.   Eyes: Negative.   Respiratory: Negative.   Cardiovascular: Negative.   Gastrointestinal: Negative.   Musculoskeletal: Negative.   Skin: Negative.   Neurological: Negative.   Psychiatric/Behavioral: Positive for depression. Negative for hallucinations, memory loss, substance abuse and suicidal ideas. The patient is nervous/anxious and has insomnia.     Blood pressure 100/78, pulse 88, temperature 98.4 F (36.9 C), temperature source Oral, resp. rate 20, height _0  (1.93 m), weight 110.7 kg, SpO2 96 %.Body mass index is 29.71 kg/m.  General Appearance: Disheveled  Eye Contact:  Fair  Speech:  Clear and Coherent and Slow  Volume:  Decreased  Mood:  Dysphoric  Affect:  Constricted  Thought Process:  Coherent  Orientation:  Full (Time, Place, and Person)  Thought Content:  Logical  Suicidal Thoughts:  No  Homicidal Thoughts:  No  Memory:  Immediate;   Fair Recent;   Fair Remote;   Fair  Judgement:  Fair  Insight:  Fair  Psychomotor Activity:  Decreased  Concentration:  Concentration: Fair  Recall:  [employer] of Knowledge:  Fair  Language:  Fair  Akathisia:  No  Handed:  Right  AIMS (if indicated):     Assets:  Desire for Improvement Housing Social Support  ADL's:   Intact  Cognition:  Impaired,  Mild  Sleep:        Treatment Plan Summary: Daily contact with patient to assess and evaluate symptoms and progress in treatment, Medication management and Plan Patient seems to have stabilized.  We had stopped the Depakote when he first came in because of uncertainty about why he was having falls also because of an elevated Depakote level.  He has however long been dependent on this medicine.  I am going to restart it at half the dose that he was taking previously only 500 mg in the evening.  I am not going to worry right now about the elevated ammonia level as I do not think that that was probably a major part of what was making him sick although it can be rechecked again if symptoms return.  Patient has been educated about how water intoxication is dangerous for him.  This is something that should be watched in the future and he is strongly encouraged to be careful not to drink excessive water.  Continue other psychiatric medicine and follow-up in the community as usual after discharge.  Disposition: No evidence of imminent risk to self or others at present.   Patient does not meet criteria for psychiatric inpatient admission. Supportive therapy provided about ongoing stressors. Discussed crisis plan, support from social network, calling 911, coming to the Emergency Department, and calling Suicide Hotline.  Alethia Berthold, MD 07/11/2018 3:59 PM

## 2018-07-11 NOTE — Care Management Important Message (Signed)
Important Message  Patient Details  Name: Oscar Duke MRN: [id number] Date of Birth: 04-07-1971   Medicare Important Message Given:  Yes    Oscar Duke 07/11/2018, 10:50 AM

## 2018-07-11 NOTE — Care Management Note (Signed)
Case Management Note  Patient Details  Name: Gari CrownMark A Hert MRN: [id number] Date of Birth: 1971-10-27  Subjective/Objective:  [facility] Patient discharging today. Will need HHPT and HHRN. [facility] to [facility] Home health orders in.                   Action/Plan:   Expected Discharge Date:  07/11/18               Expected Discharge Plan:  Home w Home Health Services  In-House Referral:  Clinical Social Work  Discharge planning Services  CM Consult  Post Acute Care Choice:  Home Health Choice offered to:  (CSW)  DME Arranged:    DME Agency:     HH Arranged:  RN, PT HH Agency:  [facility]  Status of Service:  Completed, signed off  If discussed at Long Length of Stay Meetings, dates discussed:    Additional Comments:  Marily MemosLisa M Shaniqwa Horsman, RN 07/11/2018, 4:13 PM

## 2018-07-11 NOTE — Plan of Care (Signed)

## 2018-07-11 NOTE — Progress Notes (Signed)
[facility]  ROUNDING NOTE   Subjective:   Walking around room  Na 130  Objective:  Vital signs in last 24 hours:  Temp:  [98.3 F (36.8 C)-98.5 F (36.9 C)] 98.3 F (36.8 C) (08/30 0352) Pulse Rate:  [63-71] 65 (08/30 0914) Resp:  [18-19] 18 (08/30 0914) BP: (100-120)/(63-72) 103/72 (08/30 0914) SpO2:  [98 %-100 %] 98 % (08/30 0914)  Weight change:  Filed Weights   07/09/18 1251  Weight: 110.7 kg    Intake/Output: I/O last 3 completed shifts: In: 5283.4 [P.O.:2340; I.V.:2943.4] Out: 6175 [Urine:6175]   Intake/Output this shift:  Total I/O In: 360 [P.O.:360] Out: 375 [Urine:375]  Physical Exam: General: NAD  Head: Normocephalic, atraumatic. Moist oral mucosal membranes  Eyes: Anicteric, PERRL  Neck: Supple, trachea midline  Lungs:  Clear to auscultation  Heart: Regular rate and rhythm  Abdomen:  Soft, nontender,   Extremities: No peripheral edema.  Neurologic: Nonfocal, moving all four extremities  Skin: No lesions        Basic Metabolic Panel: Recent Labs  Lab 07/09/18 1321 07/10/18 0318 07/10/18 1127 07/10/18 1657 07/10/18 2307 07/11/18 0507  NA 123* 123* 123* 122* 126* 130*  K 4.3 4.2  --   --   --  4.5  CL 91* 89*  --   --   --  97*  CO2 24 25  --   --   --  26  GLUCOSE 92 108*  --   --   --  96  BUN 7 6  --   --   --  11  CREATININE 0.82 0.67  --   --   --  0.75  CALCIUM 8.9 8.6*  --   --   --  8.7*  MG  --  1.4*  --   --   --  1.8    Liver Function Tests: Recent Labs  Lab 07/09/18 1321 07/10/18 0318  AST 17 15  ALT 7 8  ALKPHOS 35* 33*  BILITOT 0.6 0.7  PROT 6.7 6.4*  ALBUMIN 3.9 3.7   No results for input(s): LIPASE, AMYLASE in the last 168 hours. Recent Labs  Lab 07/09/18 1322 07/11/18 0507  AMMONIA 114* 38*    CBC: Recent Labs  Lab 07/09/18 1321  WBC 5.7  NEUTROABS 4.0  HGB 12.6*  HCT 35.4*  MCV 88.4  PLT 87*    Cardiac Enzymes: Recent Labs  Lab 07/09/18 1321  TROPONINI <0.03     BNP: Invalid input(s): POCBNP  CBG: Recent Labs  Lab 07/10/18 1154 07/10/18 1655 07/10/18 2124 07/11/18 0751 07/11/18 1144  GLUCAP 135* 94 95 90 103*    Microbiology: Results for orders placed or performed during the hospital encounter of 07/09/18  MRSA PCR Screening     Status: None   Collection Time: 07/10/18  4:39 AM  Result Value Ref Range Status   MRSA by PCR NEGATIVE NEGATIVE Final    Comment:        The GeneXpert MRSA Assay (FDA approved for NASAL specimens only), is one component of a comprehensive MRSA colonization surveillance program. It is not intended to diagnose MRSA infection nor to guide or monitor treatment for MRSA infections. Performed at [facility], [address] [place], [place] [address]     Coagulation Studies: No results for input(s): LABPROT, INR in the last 72 hours.  Urinalysis: Recent Labs    07/09/18 1322  COLORURINE YELLOW*  LABSPEC 1.008  PHURINE 7.0  GLUCOSEU NEGATIVE  HGBUR SMALL*  BILIRUBINUR NEGATIVE  KETONESUR NEGATIVE  PROTEINUR NEGATIVE  NITRITE NEGATIVE  LEUKOCYTESUR NEGATIVE      Imaging: Dg Thoracic Spine 2 View  Result Date: 07/09/2018 CLINICAL DATA:  47 year old presenting with multiple recent falls causing mid and low back pain. Initial encounter. EXAM: THORACIC SPINE 2 VIEWS COMPARISON:  No prior thoracic spine imaging. Visualized lumbar spine on chest x-ray 10/05/2017 is correlated. FINDINGS: Twelve rib-bearing thoracic vertebrae with anatomic alignment. No fractures. Well-preserved disc spaces throughout. Bridging ANTERIOR and LATERAL osteophytes involving the mid and LOWER thoracic spine. Pedicles intact. Note is made of mild degenerative disc disease at C5-6 on the swimmer's view. IMPRESSION: 1. No acute osseous abnormality. 2. Degenerative changes involving the mid and LOWER thoracic spine. 3. Mild degenerative disc disease at C5-6. Electronically Signed   By: Hulan Saashomas  Lawrence M.D.   On:  07/09/2018 15:14   Dg Lumbar Spine 2-3 Views  Result Date: 07/09/2018 CLINICAL DATA:  47 year old presenting with multiple recent falls causing mid and low back pain. Initial encounter. EXAM: LUMBAR SPINE - 2-3 VIEW COMPARISON:  03/12/2018. FINDINGS: Five non-rib-bearing lumbar vertebrae with anatomic alignment. No fractures. Mild disc space narrowing at L1-2, L2-3 and minimal disc space narrowing at L3-4, unchanged. ANTERIOR osteophytes arising from the UPPER and LOWER endplate of L4, unchanged. No new abnormalities. IMPRESSION: 1. No acute osseous abnormality. 2. Mild degenerative disc disease at L1-2 and L2-3 and minimal degenerative disc disease at L3-4, stable since May, 2019. Electronically Signed   By: Hulan Saashomas  Lawrence M.D.   On: 07/09/2018 15:12   [place] Abdomen Complete  Result Date: 07/10/2018 CLINICAL DATA:  Elevated ammonia level, thrombocytopenia, question of 808 two views, abdominal pain EXAM: ABDOMEN ULTRASOUND COMPLETE COMPARISON:  None. FINDINGS: Gallbladder: The gallbladder is visualized and no gallstones are noted. There is no pain over the gallbladder with compression. Common bile duct: Diameter: The common bile duct is normal measuring 2.8 mm in diameter. Liver: The parenchyma of the liver is echogenic and difficult to penetrate suggesting hepatic steatosis. No focal hepatic abnormality is seen. Portal vein is patent on color Doppler imaging with normal direction of blood flow towards the liver. IVC: No abnormality visualized. Pancreas: Pancreas is obscured by overlying bowel gas. Spleen: The spleen measures 5.9 cm. Right Kidney: Length: 12.6 cm. No hydronephrosis is seen. A small cyst is noted in the upper pole of 1.9 cm. Left Kidney: Length: 13.6 cm.  No hydronephrosis is seen. Abdominal aorta: The abdominal aorta is normal in caliber. Other findings: None. IMPRESSION: 1. No gallstones.  No ductal dilatation. 2. Suspect hepatic steatosis. 3. The pancreas is largely obscured by bowel gas.  Electronically Signed   By: Dwyane DeePaul  Barry M.D.   On: 07/10/2018 13:24     Medications:    . amLODipine  5 mg Oral Daily  . benztropine  1 mg Oral BID  . cholecalciferol  2,000 Units Oral Daily  . clonazePAM  1 mg Oral TID  . heparin  5,000 Units Subcutaneous Q8H  . insulin aspart  0-5 Units Subcutaneous QHS  . insulin aspart  0-9 Units Subcutaneous TID WC  . insulin detemir  28 Units Subcutaneous Daily  . lactulose  10 g Oral TID  . levETIRAcetam  1,000 mg Oral QHS  . lisinopril  20 mg Oral Daily  . pantoprazole  40 mg Oral Daily  . perphenazine  8 mg Oral TID  . traZODone  100 mg Oral QHS   acetaminophen **OR** acetaminophen, ondansetron **OR** ondansetron (ZOFRAN) IV,  oxyCODONE, polyethylene glycol  Assessment/ Plan:  Mr. FERNANDEZ KENLEY is a 47 y.o. hispanic male with schizophrenia/bipolar disorder, hypertension, diabetes mellitus type II, seizure disorder, hyperlipidemia, who was admitted to [facility] on 07/09/2018 for hyponatremia  1. Hyponatremia: acute on chronic hyponatremia. Euvolemic on examination. Urine specific gravity is lower end of normal. History is suggestive of psychogenic polydipsia. Which would explain why Normal saline infusion did not working.  - Fluid restriction - PO salt tablets.   2. Hypertension: blood pressure is 103/72. Home regimen of amlodipine, lisinopril Not on a thiazide diuretic.  3. Diabetes mellitus type II insulin dependent: hemoglobin A1c of 5.6% 05/15/18 No evidence of renal involvement on urinalysis.    LOS: 2 Caycee Wanat 8/30/201912:36 PM

## 2018-07-11 NOTE — Discharge Summary (Addendum)
[facility] [place] at [facility]   PATIENT NAME: Oscar ParadiseMark Burkley    MR#:  [id number]  DATE OF BIRTH:  19-Mar-1971  DATE OF ADMISSION:  07/09/2018   ADMITTING PHYSICIAN: Adrian SaranSital Mody, MD  DATE OF DISCHARGE: 07/11/2018 PRIMARY CARE PHYSICIAN: Duffy RhodyPartridge, Tanillya, FNP   ADMISSION DIAGNOSIS:  Dizziness [R42] Hyponatremia [E87.1] Hyperammonemia (HCC) [E72.20] Pain [R52] Valproic acid toxicity, accidental or unintentional, initial encounter [T42.6X1A] DISCHARGE DIAGNOSIS:  Principal Problem:   Schizoaffective disorder, bipolar type (HCC) Active Problems:   Diabetes mellitus without complication (HCC)   Hyponatremia   Increased ammonia level  SECONDARY DIAGNOSIS:   Past Medical History:  Diagnosis Date  . Bipolar 1 disorder (HCC)   . Diabetes mellitus without complication (HCC)   . Hyperlipidemia   . Hypertension   . 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 Schizophrenia, acute (HCC)   . Seizures [facility](HCC)    HOSPITAL COURSE:   47 year old male with a history of bipolar and diabetes who presents to the ER via EMS due to falls and back pain.  1. 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 Improving with normal saline IV.  2. Falls with dizziness: PT.  3. Elevated ammonia level and thrombocytopenia with remote hx of occasional EtOh abuse: Increased lactulose, ammonia level improved. Check abdominal ultrasound: . No gallstones.  No ductal dilatation.  4. Bipolar affective disorder/schizophrenia: Psychiatry consultation requested for evaluation of medications. Depakote level is elevated and Depakote is held. Continue all other medications including Cogentin, Klonopin,trazodone  5. Diabetes: on sliding scale and ADA diet, continue Levemir. 6. History of seizures: Continue Keppra 7. Essential hypertension: Continue lisinopril and Norvasc  Hypomagnesemia.  Given magnesium IV and follow-up level.  Discussed with Dr.  Wynelle LinkKolluru. DISCHARGE CONDITIONS:  Stable, discharged to assisted living facility today. CONSULTS OBTAINED:  Treatment Team:  Clapacs, Jackquline DenmarkJohn T, MD Lamont DowdyKolluru, Sarath, MD DRUG ALLERGIES:   Allergies  Allergen Reactions  . Haldol [Haloperidol Lactate]   . Inderal [Propranolol]   . Januvia [Sitagliptin] Other (See Comments)    Skin discoloration on lower legs  . Lithium Other (See Comments)    Seizures   DISCHARGE MEDICATIONS:   Allergies as of 07/11/2018      Reactions   Haldol [haloperidol Lactate]    Inderal [propranolol]    Januvia [sitagliptin] Other (See Comments)   Skin discoloration on lower legs   Lithium Other (See Comments)   Seizures      Medication List    TAKE these medications   acetaminophen 500 MG tablet Commonly known as:  TYLENOL Take 1 tablet (500 mg total) by mouth every 6 (six) hours as needed for mild pain, moderate pain, fever or headache.   amLODipine 5 MG tablet Commonly known as:  NORVASC Take 1 tablet (5 mg total) by mouth daily.   benztropine 1 MG tablet Commonly known as:  COGENTIN Take 1 tablet (1 mg total) by mouth 2 (two) times daily.   clonazePAM 1 MG tablet Commonly known as:  KLONOPIN Take 1 tablet (1 mg total) by mouth 3 (three) times daily.   divalproex 500 MG DR tablet Commonly known as:  DEPAKOTE Take 1 tablet (500 mg total) by mouth at bedtime. What changed:    when to take this  additional instructions  Another medication with the same name was removed. Continue taking this medication, and follow the directions you see here.   ibuprofen 400 MG tablet Commonly known as:  ADVIL,MOTRIN Take 1 tablet (400 mg total) by mouth  every 6 (six) hours as needed for fever, headache, mild pain, moderate pain or cramping.   insulin aspart 100 UNIT/ML injection Commonly known as:  novoLOG Inject 0-15 Units into the skin 3 (three) times daily with meals. CBG < 70: implement hypoglycemia protocol CBG 70 - 120: 0 units CBG 121 - 150: 2  units CBG 151 - 200: 3 units CBG 201 - 250: 5 units CBG 251 - 300: 8 units CBG 301 - 350: 11 units CBG 351 - 400: 15 units CBG > 400 call MD and obtain STAT lab verification   insulin detemir 100 UNIT/ML injection Commonly known as:  LEVEMIR Inject 0.28 mLs (28 Units total) into the skin daily.   lactulose 10 GM/15ML solution Commonly known as:  CHRONULAC Take 15 mLs (10 g total) by mouth 2 (two) times daily.   levETIRAcetam 1000 MG tablet Commonly known as:  KEPPRA Take 1 tablet (1,000 mg total) by mouth at bedtime.   lisinopril 20 MG tablet Commonly known as:  PRINIVIL,ZESTRIL Take 1 tablet (20 mg total) by mouth daily.   pantoprazole 40 MG tablet Commonly known as:  PROTONIX Take 1 tablet (40 mg total) by mouth daily.   perphenazine 8 MG tablet Commonly known as:  TRILAFON Take 1 tablet (8 mg total) by mouth 3 (three) times daily.   sodium chloride 1 g tablet Take 1 tablet (1 g total) by mouth daily.   traZODone 100 MG tablet Commonly known as:  DESYREL Take 1 tablet (100 mg total) by mouth at bedtime.   Vitamin D3 2000 units Tabs Take 2,000 Units by mouth daily.        DISCHARGE INSTRUCTIONS:  See AVS.  If you experience worsening of your admission symptoms, develop shortness of breath, life threatening emergency, suicidal or homicidal thoughts you must seek medical attention immediately by calling 911 or calling your MD immediately  if symptoms less severe.  You Must read complete instructions/literature along with all the possible adverse reactions/side effects for all the Medicines you take and that have been prescribed to you. Take any new Medicines after you have completely understood and accpet all the possible adverse reactions/side effects.   Please note  You were cared for by a hospitalist during your hospital stay. If you have any questions about your discharge medications or the care you received while you were in the hospital after you are  discharged, you can call the unit and asked to speak with the hospitalist on call if the hospitalist that took care of you is not available. Once you are discharged, your primary care physician will handle any further medical issues. Please note that NO REFILLS for any discharge medications will be authorized once you are discharged, as it is imperative that you return to your primary care physician (or establish a relationship with a primary care physician if you do not have one) for your aftercare needs so that they can reassess your need for medications and monitor your lab values.    On the day of Discharge:  VITAL SIGNS:  Blood pressure 103/72, pulse 65, temperature 98.3 F (36.8 C), temperature source Oral, resp. rate 18, height 6\' 4"  (1.93 m), weight 110.7 kg, SpO2 98 %. PHYSICAL EXAMINATION:  GENERAL:  47 y.o.-year-old patient lying in the bed with no acute distress.  EYES: Pupils equal, round, reactive to light and accommodation. No scleral icterus. Extraocular muscles intact.  HEENT: Head atraumatic, normocephalic. Oropharynx and nasopharynx clear.  NECK:  Supple, no jugular venous  distention. No thyroid enlargement, no tenderness.  LUNGS: Normal breath sounds bilaterally, no wheezing, rales,rhonchi or crepitation. No use of accessory muscles of respiration.  CARDIOVASCULAR: S1, S2 normal. No murmurs, rubs, or gallops.  ABDOMEN: Soft, non-tender, non-distended. Bowel sounds present. No organomegaly or mass.  EXTREMITIES: No pedal edema, cyanosis, or clubbing.  NEUROLOGIC: Cranial nerves II through XII are intact. Muscle strength 5/5 in all extremities. Sensation intact. Gait not checked.  PSYCHIATRIC: The patient is alert and oriented x 3.  SKIN: No obvious rash, lesion, or ulcer.  DATA REVIEW:   CBC Recent Labs  Lab 07/09/18 1321  WBC 5.7  HGB 12.6*  HCT 35.4*  PLT 87*    Chemistries  Recent Labs  Lab 07/10/18 0318  07/11/18 0507  NA 123*   < > 130*  K 4.2  --  4.5    CL 89*  --  97*  CO2 25  --  26  GLUCOSE 108*  --  96  BUN 6  --  11  CREATININE 0.67  --  0.75  CALCIUM 8.6*  --  8.7*  MG 1.4*  --  1.8  AST 15  --   --   ALT 8  --   --   ALKPHOS 33*  --   --   BILITOT 0.7  --   --    < > = values in this interval not displayed.     Microbiology Results  Results for orders placed or performed during the hospital encounter of 07/09/18  MRSA PCR Screening     Status: None   Collection Time: 07/10/18  4:39 AM  Result Value Ref Range Status   MRSA by PCR NEGATIVE NEGATIVE Final    Comment:        The GeneXpert MRSA Assay (FDA approved for NASAL specimens only), is one component of a comprehensive MRSA colonization surveillance program. It is not intended to diagnose MRSA infection nor to guide or monitor treatment for MRSA infections. Performed at [facility], [address], [place], [place] [address]     RADIOLOGY:  No results found.   Management plans discussed with the patient, family and they are in agreement.  CODE STATUS: Full Code   TOTAL TIME TAKING CARE OF THIS PATIENT: 32 minutes.    Shaune Pollack M.D on 07/11/2018 at 12:23 PM  Between 7am to 6pm - Pager - [phone]  After 6pm go to [email or web address] - Social research officer, government  [facility] [place]  Office  [phone]  CC: Primary care physician; Duffy Rhody, FNP   Note: This dictation was prepared with Dragon dictation along with smaller phrase technology. Any transcriptional errors that result from this process are unintentional.

## 2018-07-11 NOTE — NC FL2 (Signed)
[place] MEDICAID FL2 LEVEL OF CARE SCREENING TOOL     IDENTIFICATION  Patient Name: Oscar Duke Birthdate: May 10, 1971 Sex: male Admission Date (Current Location): 07/09/2018  [place] and [place] Number:  Chiropodist and Address:  [facility], [address] [place], [place] [address]      Provider Number: [id number]  Attending Physician Name and Address:  Shaune Pollack, MD  Relative Name and Phone Number:  Randell Loop- HCPOA [phone]    Current Level of Care: Hospital Recommended Level of Care: [facility] Prior Approval Number:    Date Approved/Denied:   PASRR Number:  [id number]  Discharge Plan: Other (Comment)(Family Care Home )    Current Diagnoses: Patient Active Problem List   Diagnosis Date Noted  . Increased ammonia level 07/09/2018  . Hyponatremia 05/28/2018  . Schizoaffective disorder, bipolar type (HCC) 05/14/2018  . Somnolence 10/05/2017  . Adverse drug interaction with prescription medication 10/05/2017  . Hypertension   . Diabetes mellitus without complication (HCC)   . Bipolar 1 disorder (HCC)   . Seizures (HCC)   . Schizophrenia, acute (HCC) 05/14/2017    Orientation RESPIRATION BLADDER Height & Weight     Self, Time, Place  Normal Continent Weight: 244 lb 0.8 oz (110.7 kg) Height:  6\' 4"  (193 cm)  BEHAVIORAL SYMPTOMS/MOOD NEUROLOGICAL BOWEL NUTRITION STATUS  (None) (None ) Continent Diet: Low Sodium/ Heart Healthy   AMBULATORY STATUS COMMUNICATION OF NEEDS Skin   Limited Assist Verbally Normal                       Personal Care Assistance Level of Assistance  Bathing, Feeding, Dressing Bathing Assistance: Limited assistance Feeding assistance: Independent Dressing Assistance: Independent     Functional Limitations Info  Sight, Hearing, Speech Sight Info: Adequate Hearing Info: Adequate Speech Info: Adequate    SPECIAL CARE FACTORS FREQUENCY   PT 2-3 days per week home health                      Contractures Contractures Info: Not present    Additional Factors Info  Code Status, Allergies Code Status Info: Full Code  Allergies Info: Haldol Haloperidol Lactate, Inderal Propranolol, Januvia Sitagliptin, Lithium          Discharge Medications: Please see discharge summary for a list of discharge medications. Medication List    STOP taking these medications   divalproex 500 MG DR tablet Commonly known as:  DEPAKOTE     TAKE these medications   acetaminophen 500 MG tablet Commonly known as:  TYLENOL Take 1 tablet (500 mg total) by mouth every 6 (six) hours as needed for mild pain, moderate pain, fever or headache.   amLODipine 5 MG tablet Commonly known as:  NORVASC Take 1 tablet (5 mg total) by mouth daily.   benztropine 1 MG tablet Commonly known as:  COGENTIN Take 1 tablet (1 mg total) by mouth 2 (two) times daily.   clonazePAM 1 MG tablet Commonly known as:  KLONOPIN Take 1 tablet (1 mg total) by mouth 3 (three) times daily.   ibuprofen 400 MG tablet Commonly known as:  ADVIL,MOTRIN Take 1 tablet (400 mg total) by mouth every 6 (six) hours as needed for fever, headache, mild pain, moderate pain or cramping.   insulin aspart 100 UNIT/ML injection Commonly known as:  novoLOG Inject 0-15 Units into the skin 3 (three) times daily with meals. CBG < 70: implement hypoglycemia protocol CBG  70 - 120: 0 units CBG 121 - 150: 2 units CBG 151 - 200: 3 units CBG 201 - 250: 5 units CBG 251 - 300: 8 units CBG 301 - 350: 11 units CBG 351 - 400: 15 units CBG > 400 call MD and obtain STAT lab verification   insulin detemir 100 UNIT/ML injection Commonly known as:  LEVEMIR Inject 0.28 mLs (28 Units total) into the skin daily.   lactulose 10 GM/15ML solution Commonly known as:  CHRONULAC Take 15 mLs (10 g total) by mouth 2 (two) times daily.   levETIRAcetam 1000 MG tablet Commonly known as:  KEPPRA Take 1 tablet (1,000 mg total)  by mouth at bedtime.   lisinopril 20 MG tablet Commonly known as:  PRINIVIL,ZESTRIL Take 1 tablet (20 mg total) by mouth daily.   pantoprazole 40 MG tablet Commonly known as:  PROTONIX Take 1 tablet (40 mg total) by mouth daily.   perphenazine 8 MG tablet Commonly known as:  TRILAFON Take 1 tablet (8 mg total) by mouth 3 (three) times daily.   sodium chloride 1 g tablet Take 1 tablet (1 g total) by mouth daily.   traZODone 100 MG tablet Commonly known as:  DESYREL Take 1 tablet (100 mg total) by mouth at bedtime.   Vitamin D3 2000 units Tabs Take 2,000 Units by mouth daily.     Relevant Imaging Results: Relevant Lab Results: Additional Information  SSN: [id number]  Daanish Copes, Darleen CrockerBailey M, [place]

## 2018-07-20 ENCOUNTER — Emergency Department: Payer: Medicare Other

## 2018-07-20 ENCOUNTER — Other Ambulatory Visit: Payer: Self-pay

## 2018-07-20 ENCOUNTER — Inpatient Hospital Stay
Admission: EM | Admit: 2018-07-20 | Discharge: 2018-07-21 | DRG: 101 | Disposition: A | Discharge status: Home / Self Care | Payer: Medicare Other | Attending: Internal Medicine | Admitting: Internal Medicine

## 2018-07-20 DIAGNOSIS — F250 Schizoaffective disorder, bipolar type: Secondary | ICD-10-CM | POA: Diagnosis not present

## 2018-07-20 DIAGNOSIS — Z888 Allergy status to other drugs, medicaments and biological substances status: Secondary | ICD-10-CM

## 2018-07-20 DIAGNOSIS — Z794 Long term (current) use of insulin: Secondary | ICD-10-CM | POA: Diagnosis not present

## 2018-07-20 DIAGNOSIS — R569 Unspecified convulsions: Secondary | ICD-10-CM

## 2018-07-20 DIAGNOSIS — Z79899 Other long term (current) drug therapy: Secondary | ICD-10-CM | POA: Diagnosis not present

## 2018-07-20 DIAGNOSIS — I10 Essential (primary) hypertension: Secondary | ICD-10-CM | POA: Diagnosis not present

## 2018-07-20 DIAGNOSIS — E785 Hyperlipidemia, unspecified: Secondary | ICD-10-CM | POA: Diagnosis present

## 2018-07-20 DIAGNOSIS — E871 Hypo-osmolality and hyponatremia: Secondary | ICD-10-CM | POA: Diagnosis present

## 2018-07-20 DIAGNOSIS — G40909 Epilepsy, unspecified, not intractable, without status epilepticus: Principal | ICD-10-CM | POA: Diagnosis present

## 2018-07-20 DIAGNOSIS — K219 Gastro-esophageal reflux disease without esophagitis: Secondary | ICD-10-CM | POA: Diagnosis not present

## 2018-07-20 DIAGNOSIS — E119 Type 2 diabetes mellitus without complications: Secondary | ICD-10-CM | POA: Diagnosis present

## 2018-07-20 DIAGNOSIS — E162 Hypoglycemia, unspecified: Secondary | ICD-10-CM

## 2018-07-20 LAB — VALPROIC ACID LEVEL: Valproic Acid Lvl: 54 ug/mL (ref 50.0–100.0)

## 2018-07-20 LAB — ETHANOL: Alcohol, Ethyl (B): <10 mg/dL (ref <10)

## 2018-07-20 LAB — BASIC METABOLIC PANEL
Anion gap: 8 (ref 5–15)
BUN: 8 mg/dL (ref 6–20)
CHLORIDE: 91 mmol/L — AB (ref 98–111)
CO2: 24 mmol/L (ref 22–32)
CREATININE: 0.76 mg/dL (ref 0.61–1.24)
Calcium: 8.9 mg/dL (ref 8.9–10.3)
GFR calc Af Amer: >60 mL/min (ref >60)
Glucose, Bld: 130 mg/dL — ABNORMAL HIGH (ref 70–99)
POTASSIUM: 3.8 mmol/L (ref 3.5–5.1)
SODIUM: 123 mmol/L — AB (ref 135–145)

## 2018-07-20 LAB — URINALYSIS, COMPLETE (UACMP) WITH MICROSCOPIC
BACTERIA UA: NONE SEEN
Bilirubin Urine: NEGATIVE
Glucose, UA: NEGATIVE mg/dL
Ketones, ur: 5 mg/dL — AB
Leukocytes, UA: NEGATIVE
NITRITE: NEGATIVE
PH: 6 (ref 5.0–8.0)
Protein, ur: NEGATIVE mg/dL
SPECIFIC GRAVITY, URINE: 1.009 (ref 1.005–1.030)
Squamous Epithelial / LPF: NONE SEEN (ref 0–5)

## 2018-07-20 LAB — CBC
HEMATOCRIT: 33.2 % — AB (ref 40.0–52.0)
Hemoglobin: 12.2 g/dL — ABNORMAL LOW (ref 13.0–18.0)
MCH: 32.3 pg (ref 26.0–34.0)
MCHC: 36.6 g/dL — AB (ref 32.0–36.0)
MCV: 88.3 fL (ref 80.0–100.0)
PLATELETS: 171 10*3/uL (ref 150–440)
RBC: 3.76 MIL/uL — ABNORMAL LOW (ref 4.40–5.90)
RDW: 15.6 % — AB (ref 11.5–14.5)
WBC: 6.2 10*3/uL (ref 3.8–10.6)

## 2018-07-20 LAB — URINE DRUG SCREEN, QUALITATIVE (ARMC ONLY)
AMPHETAMINES, UR SCREEN: NOT DETECTED
BARBITURATES, UR SCREEN: NOT DETECTED
Benzodiazepine, Ur Scrn: NOT DETECTED
COCAINE METABOLITE, UR ~~LOC~~: NOT DETECTED
Cannabinoid 50 Ng, Ur ~~LOC~~: NOT DETECTED
MDMA (ECSTASY) UR SCREEN: NOT DETECTED
METHADONE SCREEN, URINE: NOT DETECTED
OPIATE, UR SCREEN: NOT DETECTED
Phencyclidine (PCP) Ur S: NOT DETECTED
TRICYCLIC, UR SCREEN: NOT DETECTED

## 2018-07-20 LAB — GLUCOSE, CAPILLARY: Glucose-Capillary: 129 mg/dL — ABNORMAL HIGH (ref 70–99)

## 2018-07-20 MED ORDER — LEVETIRACETAM 500 MG PO TABS
1000.0000 mg | ORAL_TABLET | Freq: Every day | ORAL | Status: DC
  Filled 2018-07-20: qty 2

## 2018-07-20 MED ORDER — INSULIN ASPART 100 UNIT/ML ~~LOC~~ SOLN
0.0000 [IU] | Freq: Three times a day (TID) | SUBCUTANEOUS | Status: DC
  Administered 2018-07-21: 1 [IU] via SUBCUTANEOUS
  Filled 2018-07-20: qty 1

## 2018-07-20 MED ORDER — TRAZODONE HCL 50 MG PO TABS
100.0000 mg | ORAL_TABLET | Freq: Every day | ORAL | Status: DC
  Administered 2018-07-20: 23:00:00 100 mg via ORAL
  Filled 2018-07-20: qty 2

## 2018-07-20 MED ORDER — PANTOPRAZOLE SODIUM 40 MG PO TBEC
40.0000 mg | DELAYED_RELEASE_TABLET | Freq: Every day | ORAL | Status: DC
  Administered 2018-07-21: 09:00:00 40 mg via ORAL
  Filled 2018-07-20: qty 1

## 2018-07-20 MED ORDER — INSULIN ASPART 100 UNIT/ML ~~LOC~~ SOLN: 0.0000 [IU] | Freq: Every day | SUBCUTANEOUS | Status: DC

## 2018-07-20 MED ORDER — ACETAMINOPHEN 325 MG PO TABS: 650.0000 mg | ORAL_TABLET | Freq: Four times a day (QID) | ORAL | Status: DC | PRN

## 2018-07-20 MED ORDER — SODIUM CHLORIDE 0.9 % IV SOLN
Freq: Once | INTRAVENOUS | Status: AC
  Administered 2018-07-20: 19:00:00 via INTRAVENOUS

## 2018-07-20 MED ORDER — BENZTROPINE MESYLATE 1 MG PO TABS
1.0000 mg | ORAL_TABLET | Freq: Two times a day (BID) | ORAL | Status: DC
  Administered 2018-07-20 – 2018-07-21 (×2): 1 mg via ORAL
  Filled 2018-07-20 (×3): qty 1

## 2018-07-20 MED ORDER — ONDANSETRON HCL 4 MG PO TABS: 4.0000 mg | ORAL_TABLET | Freq: Four times a day (QID) | ORAL | Status: DC | PRN

## 2018-07-20 MED ORDER — ACETAMINOPHEN 650 MG RE SUPP: 650.0000 mg | Freq: Four times a day (QID) | RECTAL | Status: DC | PRN

## 2018-07-20 MED ORDER — SODIUM CHLORIDE 1 G PO TABS
1.0000 g | ORAL_TABLET | Freq: Every day | ORAL | Status: DC
  Administered 2018-07-21: 1 g via ORAL
  Filled 2018-07-20: qty 1

## 2018-07-20 MED ORDER — LACTULOSE 10 GM/15ML PO SOLN
10.0000 g | Freq: Two times a day (BID) | ORAL | Status: DC
  Administered 2018-07-20 – 2018-07-21 (×2): 10 g via ORAL
  Filled 2018-07-20 (×2): qty 30

## 2018-07-20 MED ORDER — AMLODIPINE BESYLATE 5 MG PO TABS
5.0000 mg | ORAL_TABLET | Freq: Every day | ORAL | Status: DC
  Administered 2018-07-21: 09:00:00 5 mg via ORAL
  Filled 2018-07-20: qty 1

## 2018-07-20 MED ORDER — ONDANSETRON HCL 4 MG/2ML IJ SOLN: 4.0000 mg | Freq: Four times a day (QID) | INTRAMUSCULAR | Status: DC | PRN

## 2018-07-20 MED ORDER — ENOXAPARIN SODIUM 40 MG/0.4ML ~~LOC~~ SOLN: 40.0000 mg | SUBCUTANEOUS | Status: DC

## 2018-07-20 MED ORDER — FOLIC ACID 1 MG PO TABS
1.0000 mg | ORAL_TABLET | Freq: Every day | ORAL | Status: DC
  Administered 2018-07-21: 09:00:00 1 mg via ORAL
  Filled 2018-07-20: qty 1

## 2018-07-20 MED ORDER — FLUPHENAZINE HCL 5 MG PO TABS
10.0000 mg | ORAL_TABLET | Freq: Three times a day (TID) | ORAL | Status: DC
  Administered 2018-07-20 – 2018-07-21 (×2): 10 mg via ORAL
  Filled 2018-07-20 (×4): qty 2

## 2018-07-20 MED ORDER — LEVETIRACETAM IN NACL 1000 MG/100ML IV SOLN
1000.0000 mg | Freq: Once | INTRAVENOUS | Status: AC
  Administered 2018-07-20: 1000 mg via INTRAVENOUS
  Filled 2018-07-20: qty 100

## 2018-07-20 MED ORDER — LORAZEPAM 2 MG/ML IJ SOLN
1.0000 mg | Freq: Once | INTRAMUSCULAR | Status: AC
  Administered 2018-07-20: 1 mg via INTRAVENOUS
  Filled 2018-07-20: qty 1

## 2018-07-20 NOTE — H&P (Signed)
[facility] - [place] at [facility]   PATIENT NAME: Oscar Duke    MR#:  [id number]  DATE OF BIRTH:  [birth year]  DATE OF ADMISSION:  07/20/2018  PRIMARY CARE PHYSICIAN: Duffy Rhody, FNP   REQUESTING/REFERRING PHYSICIAN: Alphonzo Lemmings, MD  CHIEF COMPLAINT:   Chief Complaint  Patient presents with  . Seizures  . Laceration    HISTORY OF PRESENT ILLNESS:  Oscar Duke  is a 47 y.o. male who presents with chief complaint as above.  Patient has a long-standing known history of schizoaffective disorder, seizure disorder, hyponatremia, and has had recent admission for valproic acid toxicity.  He presents to the hospital today after having another seizure.  He has significant right frontal swelling around his eye due to striking his head when he fell.  CT head is largely unchanged with no acute finding.  Patient at this time has recovered awareness and seems to be functioning at baseline mental status.  Hospitalist were called for admission  PAST MEDICAL HISTORY:   Past Medical History:  Diagnosis Date  . Bipolar 1 disorder (HCC)   . Diabetes mellitus without complication (HCC)   . Hyperlipidemia   . Hypertension   . 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 Schizophrenia, acute (HCC)   . Seizures (HCC)      PAST SURGICAL HISTORY:   Past Surgical History:  Procedure Laterality Date  . JOINT REPLACEMENT       SOCIAL HISTORY:   Social History   Tobacco Use  . Smoking status: Never Smoker  . Smokeless tobacco: Never Used  Substance Use Topics  . Alcohol use: No     FAMILY HISTORY:  Patient is unaware of any family history of medical conditions.   DRUG ALLERGIES:   Allergies  Allergen Reactions  . Haldol [Haloperidol Lactate]   . Inderal [Propranolol]   . Januvia [Sitagliptin] Other (See Comments)    Skin discoloration on lower legs  . Lithium Other (See Comments)    Seizures    MEDICATIONS AT HOME:   Prior to  Admission medications   Medication Sig Start Date End Date Taking? Authorizing Provider  acetaminophen (TYLENOL) 500 MG tablet Take 1 tablet (500 mg total) by mouth every 6 (six) hours as needed for mild pain, moderate pain, fever or headache. 05/28/18  Yes O'Neal, Fransico Setters, MD  amLODipine (NORVASC) 5 MG tablet Take 1 tablet (5 mg total) by mouth daily. 05/28/18  Yes [name], MD  benztropine (COGENTIN) 1 MG tablet Take 1 tablet (1 mg total) by mouth 2 (two) times daily. 05/28/18  Yes [name], MD  Cholecalciferol (VITAMIN D3) 2000 units TABS Take 2,000 Units by mouth daily. 05/30/18  Yes [name], MD  fluPHENAZine (PROLIXIN) 10 MG tablet Take 10 mg by mouth 3 (three) times daily. 07/16/18  Yes [provider]  insulin detemir (LEVEMIR) 100 UNIT/ML injection Inject 0.28 mLs (28 Units total) into the skin daily. 05/30/18  Yes [name], MD  lactulose (CHRONULAC) 10 GM/15ML solution Take 15 mLs (10 g total) by mouth 2 (two) times daily. 05/28/18  Yes [name], MD  levETIRAcetam (KEPPRA) 1000 MG tablet Take 1 tablet (1,000 mg total) by mouth at bedtime. 05/28/18  Yes O'Neal, Fransico Setters, MD  lisinopril (PRINIVIL,ZESTRIL) 20 MG tablet Take 1 tablet (20 mg total) by mouth daily. 05/28/18  Yes O'Neal, Fransico Setters, MD  pantoprazole (PROTONIX) 40 MG tablet Take 1 tablet (40 mg total) by mouth daily. 05/28/18  Yes [name], MD  sodium chloride 1 g tablet Take 1 tablet (1 g total) by mouth daily. 05/28/18  Yes [name], MD  traZODone (DESYREL) 100 MG tablet Take 1 tablet (100 mg total) by mouth at bedtime. 05/28/18  Yes O'Neal, Fransico Setters, MD  clonazePAM (KLONOPIN) 1 MG tablet Take 1 tablet (1 mg total) by mouth 3 (three) times daily. Patient not taking: Reported on 07/20/2018 05/28/18   [name], MD  divalproex (DEPAKOTE) 500 MG DR tablet Take 1 tablet (500 mg total) by mouth at bedtime. Patient not taking: Reported on 07/20/2018 07/11/18   Shaune Pollack, MD  ibuprofen (ADVIL,MOTRIN) 400  MG tablet Take 1 tablet (400 mg total) by mouth every 6 (six) hours as needed for fever, headache, mild pain, moderate pain or cramping. Patient not taking: Reported on 07/20/2018 05/28/18   [name], MD  insulin aspart (NOVOLOG) 100 UNIT/ML injection Inject 0-15 Units into the skin 3 (three) times daily with meals. CBG < 70: implement hypoglycemia protocol CBG 70 - 120: 0 units CBG 121 - 150: 2 units CBG 151 - 200: 3 units CBG 201 - 250: 5 units CBG 251 - 300: 8 units CBG 301 - 350: 11 units CBG 351 - 400: 15 units CBG > 400 call MD and obtain STAT lab verification Patient not taking: Reported on 07/09/2018 05/28/18   [name], MD  perphenazine (TRILAFON) 8 MG tablet Take 1 tablet (8 mg total) by mouth 3 (three) times daily. Patient not taking: Reported on 07/20/2018 05/28/18   [name], MD    REVIEW OF SYSTEMS:  Review of Systems  Constitutional: Negative for chills, fever, malaise/fatigue and weight loss.  HENT: Negative for ear pain, hearing loss and tinnitus.   Eyes: Negative for blurred vision, double vision, pain and redness.  Respiratory: Negative for cough, hemoptysis and shortness of breath.   Cardiovascular: Negative for chest pain, palpitations, orthopnea and leg swelling.  Gastrointestinal: Negative for abdominal pain, constipation, diarrhea, nausea and vomiting.  Genitourinary: Negative for dysuria, frequency and hematuria.  Musculoskeletal: Positive for falls. Negative for back pain, joint pain and neck pain.  Skin:       No acne, rash, or lesions  Neurological: Positive for seizures. Negative for dizziness, tremors, focal weakness and weakness.  Endo/Heme/Allergies: Negative for polydipsia. Does not bruise/bleed easily.  Psychiatric/Behavioral: Negative for depression. The patient is not nervous/anxious and does not have insomnia.      VITAL SIGNS:   Vitals:   07/20/18 1746 07/20/18 1747 07/20/18 2024  BP:  119/76 125/71  Pulse:  (!) 102 87  Resp:  18  18  Temp:  98.7 F (37.1 C)   TempSrc:  Oral   SpO2:  95% 96%  Weight: 111.1 kg    Height: 6\' 6"  (1.981 m)     Wt Readings from Last 3 Encounters:  07/20/18 111.1 kg  07/09/18 110.7 kg  05/13/18 110.7 kg    PHYSICAL EXAMINATION:  Physical Exam  Vitals reviewed. Constitutional: He is oriented to person, place, and time. He appears well-developed and well-nourished. No distress.  HENT:  Head: Normocephalic.  Mouth/Throat: Oropharynx is clear and moist.  Frontal hematoma around his right eye  Eyes: Pupils are equal, round, and reactive to light. Conjunctivae and EOM are normal. No scleral icterus.  Neck: Normal range of motion. Neck supple. No JVD present. No thyromegaly present.  Cardiovascular: Normal rate, regular rhythm and intact distal pulses. Exam reveals no gallop and no friction rub.  No murmur heard. Respiratory: Effort normal and  breath sounds normal. No respiratory distress. He has no wheezes. He has no rales.  GI: Soft. Bowel sounds are normal. He exhibits no distension. There is no tenderness.  Musculoskeletal: Normal range of motion. He exhibits no edema.  No arthritis, no gout  Lymphadenopathy:    He has no cervical adenopathy.  Neurological: He is alert and oriented to person, place, and time. No cranial nerve deficit.  No dysarthria, no aphasia  Skin: Skin is warm and dry. No rash noted. No erythema.  Psychiatric:  Patient displays some tangential thinking, and also seems to have some very mild pressured speech, though it is unclear whether or not this is just the patient's baseline    LABORATORY PANEL:   CBC Recent Labs  Lab 07/20/18 1755  WBC 6.2  HGB 12.2*  HCT 33.2*  PLT 171   ------------------------------------------------------------------------------------------------------------------  Chemistries  Recent Labs  Lab 07/20/18 1755  NA 123*  K 3.8  CL 91*  CO2 24  GLUCOSE 130*  BUN 8  CREATININE 0.76  CALCIUM 8.9    ------------------------------------------------------------------------------------------------------------------  Cardiac Enzymes No results for input(s): TROPONINI in the last 168 hours. ------------------------------------------------------------------------------------------------------------------  RADIOLOGY:  Ct Head Wo Contrast  Result Date: 07/20/2018 CLINICAL DATA:  Right periorbital abrasion after a seizure with a fall. EXAM: CT HEAD WITHOUT CONTRAST TECHNIQUE: Contiguous axial images were obtained from the base of the skull through the vertex without intravenous contrast. COMPARISON:  05/17/2018 FINDINGS: Brain: Stable findings of atrophy advanced for age and mild prominence of the ventricular system which is likely ex vacuo and not changed from prior. Otherwise, the brainstem, cerebellum, cerebral peduncles, thalami, basal ganglia, basilar cisterns, and ventricular system appear within normal limits. No intracranial hemorrhage, mass lesion, or acute CVA. Vascular: Atherosclerotic calcification of the cavernous carotid arteries and of the vertebral arteries. Skull: Unremarkable Sinuses/Orbits: Chronic bilateral maxillary sinusitis. Other: Right periorbital soft tissue swelling. IMPRESSION: 1. No acute intracranial findings. 2. Right periorbital soft tissue swelling. 3. Somewhat advanced for age atrophy, with mild chronic prominence of the ventricular system which is likely ex vacuo. 4. Atherosclerosis. 5. Chronic bilateral maxillary sinusitis. Electronically Signed   By: Gaylyn Rong M.D.   On: 07/20/2018 18:39    EKG:   Orders placed or performed during the hospital encounter of 05/13/18  . ED EKG  . ED EKG  . EKG 12-Lead  . EKG 12-Lead    IMPRESSION AND PLAN:  Principal Problem:   Seizures (HCC) -patient was given IV Keppra in the ED tonight.  His cognition has returned, possibly to his baseline though this Clinical research associate is unaware of what his exact baseline is.  We will  continue his antiepileptics.  He has been taking Keppra 1000 mg nightly per his medication reconciliation.  Patient confirms he has been taking Keppra, though he does not know dosage or frequency.  I was unable to independently confirm whether or not he has been taking his antiepileptic medication.  Neurology consult Active Problems:   Hyponatremia -seems to be chronic for this patient, medication reconciliation shows that he is supposed to be on scheduled 1 g sodium chloride tablet.  Unclear if he has been taking this.   Hypertension -stable tonight, continue home meds   Diabetes mellitus without complication (HCC) -sliding scale insulin with corresponding glucose checks   Schizoaffective disorder, bipolar type (HCC) -continue medications, can consider psychiatry consult if felt necessary   GERD (gastroesophageal reflux disease) -Home dose PPI  Chart review performed and case discussed with ED provider.  Labs, imaging and/or ECG reviewed by provider and discussed with patient/family. Management plans discussed with the patient and/or family.  DVT PROPHYLAXIS: SubQ lovenox   GI PROPHYLAXIS:  PPI   ADMISSION STATUS: Inpatient     CODE STATUS: Full Code Status History    Date Active Date Inactive Code Status Order ID Comments User Context   07/09/2018 1614 07/12/2018 0012 Full Code [id number]  Adrian Saran, MD Inpatient   05/14/2018 2025 05/28/2018 1801 Full Code [id number]  Audery Amel, MD Inpatient   10/05/2017 1651 10/06/2017 2106 Full Code [id number]  Levie Heritage, DO ED    Advance Directive Documentation     Most Recent Value  Type of Advance Directive  Healthcare Power of Attorney  Pre-existing out of facility DNR order (yellow form or pink MOST form)  -  "MOST" Form in Place?  -      TOTAL TIME TAKING CARE OF THIS PATIENT: 45 minutes.   Consepcion Utt FIELDING 07/20/2018, 8:44 PM  [facility]  Office  [phone]  CC: Primary care physician; Duffy Rhody, FNP  Note:  This document was prepared using Dragon voice recognition software and may include unintentional dictation errors.

## 2018-07-20 NOTE — ED Notes (Signed)
PT given Meal tray at this time. Call light within reach. Will continue to monitor for changes.

## 2018-07-20 NOTE — ED Provider Notes (Addendum)
[facility] Emergency Department Provider Note  ____________________________________________   I have reviewed the triage vital signs and the nursing notes. Where available I have reviewed prior notes and, if possible and indicated, outside hospital notes.    HISTORY  Chief Complaint Seizures and Laceration    HPI Oscar Duke is a 47 y.o. male known seizure history, reportedly compliant he does state a facility and it does appear that they are giving him his medications, on chronic clonidine, which he has been getting most recent dose this morning, on chronic Keppra which she states he is getting, was on Depakote for mood stabilization which he is no letter taken.  Does have a history of schizophrenia.  Had a seizure that today.  Did hit his head.  Denies any significant injury or headache.  States his tetanus is up-to-date.  No other alleviating or aggravating factors no other prior treatment no other complaints.    Past Medical History:  Diagnosis Date  . Bipolar 1 disorder (HCC)   . Diabetes mellitus without complication (HCC)   . Hyperlipidemia   . Hypertension   . 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 Schizophrenia, acute (HCC)   . Seizures [facility])     Patient Active Problem List   Diagnosis Date Noted  . Increased ammonia level 07/09/2018  . Hyponatremia 05/28/2018  . Schizoaffective disorder, bipolar type (HCC) 05/14/2018  . Somnolence 10/05/2017  . Adverse drug interaction with prescription medication 10/05/2017  . Hypertension   . Diabetes mellitus without complication (HCC)   . Bipolar 1 disorder (HCC)   . Seizures (HCC)   . Schizophrenia, acute (HCC) 05/14/2017    Past Surgical History:  Procedure Laterality Date  . JOINT REPLACEMENT      Prior to Admission medications   Medication Sig Start Date End Date Taking? Authorizing Provider  acetaminophen (TYLENOL) 500 MG tablet Take 1 tablet (500 mg total) by mouth  every 6 (six) hours as needed for mild pain, moderate pain, fever or headache. 05/28/18  Yes O'Neal, Fransico Setters, MD  amLODipine (NORVASC) 5 MG tablet Take 1 tablet (5 mg total) by mouth daily. 05/28/18  Yes [name], MD  benztropine (COGENTIN) 1 MG tablet Take 1 tablet (1 mg total) by mouth 2 (two) times daily. 05/28/18  Yes [name], MD  Cholecalciferol (VITAMIN D3) 2000 units TABS Take 2,000 Units by mouth daily. 05/30/18  Yes [name], MD  fluPHENAZine (PROLIXIN) 10 MG tablet Take 10 mg by mouth 3 (three) times daily. 07/16/18  Yes [provider]  insulin detemir (LEVEMIR) 100 UNIT/ML injection Inject 0.28 mLs (28 Units total) into the skin daily. 05/30/18  Yes [name], MD  lactulose (CHRONULAC) 10 GM/15ML solution Take 15 mLs (10 g total) by mouth 2 (two) times daily. 05/28/18  Yes [name], MD  levETIRAcetam (KEPPRA) 1000 MG tablet Take 1 tablet (1,000 mg total) by mouth at bedtime. 05/28/18  Yes O'Neal, Fransico Setters, MD  lisinopril (PRINIVIL,ZESTRIL) 20 MG tablet Take 1 tablet (20 mg total) by mouth daily. 05/28/18  Yes O'Neal, Fransico Setters, MD  pantoprazole (PROTONIX) 40 MG tablet Take 1 tablet (40 mg total) by mouth daily. 05/28/18  Yes O'Neal, Fransico Setters, MD  sodium chloride 1 g tablet Take 1 tablet (1 g total) by mouth daily. 05/28/18  Yes [name], MD  traZODone (DESYREL) 100 MG tablet Take 1 tablet (100 mg total) by mouth at bedtime. 05/28/18  Yes [name], MD  clonazePAM (KLONOPIN) 1 MG tablet  Take 1 tablet (1 mg total) by mouth 3 (three) times daily. Patient not taking: Reported on 07/20/2018 05/28/18   [name], MD  divalproex (DEPAKOTE) 500 MG DR tablet Take 1 tablet (500 mg total) by mouth at bedtime. Patient not taking: Reported on 07/20/2018 07/11/18   Shaune Pollack, MD  ibuprofen (ADVIL,MOTRIN) 400 MG tablet Take 1 tablet (400 mg total) by mouth every 6 (six) hours as needed for fever, headache, mild pain, moderate pain or cramping. Patient not taking: Reported  on 07/20/2018 05/28/18   [name], MD  insulin aspart (NOVOLOG) 100 UNIT/ML injection Inject 0-15 Units into the skin 3 (three) times daily with meals. CBG < 70: implement hypoglycemia protocol CBG 70 - 120: 0 units CBG 121 - 150: 2 units CBG 151 - 200: 3 units CBG 201 - 250: 5 units CBG 251 - 300: 8 units CBG 301 - 350: 11 units CBG 351 - 400: 15 units CBG > 400 call MD and obtain STAT lab verification Patient not taking: Reported on 07/09/2018 05/28/18   [name], MD  perphenazine (TRILAFON) 8 MG tablet Take 1 tablet (8 mg total) by mouth 3 (three) times daily. Patient not taking: Reported on 07/20/2018 05/28/18   [name], MD    Allergies Haldol [haloperidol lactate]; Inderal [propranolol]; Januvia [sitagliptin]; and Lithium  History reviewed. No pertinent family history.  Social History Social History   Tobacco Use  . Smoking status: Never Smoker  . Smokeless tobacco: Never Used  Substance Use Topics  . Alcohol use: No  . Drug use: Yes    Types: Marijuana    Comment: quit 5 years ago    Review of Systems Constitutional: No fever/chills Eyes: No visual changes. ENT: No sore throat. No stiff neck no neck pain Cardiovascular: Denies chest pain. Respiratory: Denies shortness of breath. Gastrointestinal:   no vomiting.  No diarrhea.  No constipation. Genitourinary: Negative for dysuria. Musculoskeletal: Negative lower extremity swelling Skin: Negative for rash. Neurological: Negative for severe headaches, focal weakness or numbness.   ____________________________________________   PHYSICAL EXAM:  VITAL SIGNS: ED Triage Vitals  Enc Vitals Group     BP 07/20/18 1747 119/76     Pulse Rate 07/20/18 1747 (!) 102     Resp 07/20/18 1747 18     Temp 07/20/18 1747 98.7 F (37.1 C)     Temp Source 07/20/18 1747 Oral     SpO2 07/20/18 1747 95 %     Weight 07/20/18 1746 245 lb (111.1 kg)     Height 07/20/18 1746 6\' 6"  (1.981 m)     Head Circumference --       Peak Flow --      Pain Score 07/20/18 1746 0     Pain Loc --      Pain Edu? --      Excl. in GC? --     Constitutional: Alert and oriented. Well appearing and in no acute distress. Eyes: Conjunctivae are normal Head: Abrasion and scratch noted around the right eye did not involve the right eye.  On the orbital wall to the temporal region HEENT: No congestion/rhinnorhea. Mucous membranes are moist.  Oropharynx non-erythematous Neck:   Nontender with no meningismus, no masses, no stridor Cardiovascular: Normal rate, regular rhythm. Grossly normal heart sounds.  Good peripheral circulation. Respiratory: Normal respiratory effort.  No retractions. Lungs CTAB. Abdominal: Soft and nontender. No distention. No guarding no rebound Back:  There is no focal tenderness or step off.  there is  no midline tenderness there are no lesions noted. there is no CVA tenderness Musculoskeletal: No lower extremity tenderness, no upper extremity tenderness. No joint effusions, no DVT signs strong distal pulses no edema Neurologic:  Normal speech and language. No gross focal neurologic deficits are appreciated.  Skin:  Skin is warm, dry and intact. No rash noted. Psychiatric: Mood and affect are more than usual.  He is telling me what kind of women he likes.,. Speech and behavior are normal.  ____________________________________________   LABS (all labs ordered are listed, but only abnormal results are displayed)  Labs Reviewed  BASIC METABOLIC PANEL - Abnormal; Notable for the following components:      Result Value   Sodium 123 (*)    Chloride 91 (*)    Glucose, Bld 130 (*)    All other components within normal limits  CBC - Abnormal; Notable for the following components:   RBC 3.76 (*)    Hemoglobin 12.2 (*)    HCT 33.2 (*)    MCHC 36.6 (*)    RDW 15.6 (*)    All other components within normal limits  ETHANOL  VALPROIC ACID LEVEL  URINALYSIS, COMPLETE (UACMP) WITH MICROSCOPIC  URINE DRUG  SCREEN, QUALITATIVE ([facility] ONLY)  CBG MONITORING, ED    Pertinent labs  results that were available during my care of the patient were reviewed by me and considered in my medical decision making (see chart for details). ____________________________________________  EKG  I personally interpreted any EKGs ordered by me or triage  ____________________________________________  RADIOLOGY  Pertinent labs & imaging results that were available during my care of the patient were reviewed by me and considered in my medical decision making (see chart for details). If possible, patient and/or family made aware of any abnormal findings.  No results found. ____________________________________________    PROCEDURES  Procedure(s) performed: None  Procedures  Critical Care performed: None  ____________________________________________   INITIAL IMPRESSION / ASSESSMENT AND PLAN / ED COURSE  Pertinent labs & imaging results that were available during my care of the patient were reviewed by me and considered in my medical decision making (see chart for details).  Patient with a history of seizure disorder had a seizure this morning apparently, unwitnessed by me but reported by EMS.  Blood sugar is reassuring.  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  Certainly possible this could contribute to his seizure I suppose although that is somewhat high for seizure threshold for sodium in a patient with a pre-existing seizure disorder, that certainly could possibly contribute.  Patient's head will be CT and unfortunately again given obvious head trauma, he has an somewhat unusual demeanor but I do not think that there is anything acute.    ____________________________________________   FINAL CLINICAL IMPRESSION(S) / ED DIAGNOSES  Final diagnoses:  Seizure (HCC)  Hypoglycemia      This chart was dictated using voice recognition software.  Despite best  efforts to proofread,  errors can occur which can change meaning.       Jeanmarie Plant, MD 07/20/18 1846    Jeanmarie Plant, MD 07/20/18 [id number]

## 2018-07-20 NOTE — ED Triage Notes (Addendum)
Pt arrives from [facility] for a seizure. While seizing fell and hit R side of face on porch. Lac noted above R eye. VSS with EMS. CBG 117. States pt has been taking all his meds like he is supposed to. Hx bipolar and shizophrenia. Takes keppra. Pt arrives alert and laughing, making jokes. Bleeding controlled. Last seizure was last year.

## 2018-07-20 NOTE — ED Notes (Signed)
Pt up in room, constantly on the call light. Snack given at this time. Waiting admission orders.

## 2018-07-20 NOTE — ED Notes (Signed)
Pt taken to floor via WC. VSS. NAD. Report called to [place] on the floor.

## 2018-07-21 DIAGNOSIS — R569 Unspecified convulsions: Secondary | ICD-10-CM | POA: Diagnosis not present

## 2018-07-21 DIAGNOSIS — G40909 Epilepsy, unspecified, not intractable, without status epilepticus: Secondary | ICD-10-CM | POA: Diagnosis not present

## 2018-07-21 LAB — BASIC METABOLIC PANEL
ANION GAP: 8 (ref 5–15)
BUN: 8 mg/dL (ref 6–20)
CO2: 26 mmol/L (ref 22–32)
CREATININE: 0.8 mg/dL (ref 0.61–1.24)
Calcium: 8.8 mg/dL — ABNORMAL LOW (ref 8.9–10.3)
Chloride: 95 mmol/L — ABNORMAL LOW (ref 98–111)
Glucose, Bld: 149 mg/dL — ABNORMAL HIGH (ref 70–99)
Potassium: 3.8 mmol/L (ref 3.5–5.1)
SODIUM: 129 mmol/L — AB (ref 135–145)

## 2018-07-21 LAB — CBC
HCT: 33.6 % — ABNORMAL LOW (ref 40.0–52.0)
Hemoglobin: 12 g/dL — ABNORMAL LOW (ref 13.0–18.0)
MCH: 32.3 pg (ref 26.0–34.0)
MCHC: 35.8 g/dL (ref 32.0–36.0)
MCV: 90.2 fL (ref 80.0–100.0)
Platelets: 161 K/uL (ref 150–440)
RBC: 3.73 MIL/uL — ABNORMAL LOW (ref 4.40–5.90)
RDW: 15.5 % — ABNORMAL HIGH (ref 11.5–14.5)
WBC: 7.5 K/uL (ref 3.8–10.6)

## 2018-07-21 LAB — GLUCOSE, CAPILLARY
GLUCOSE-CAPILLARY: 115 mg/dL — AB (ref 70–99)
GLUCOSE-CAPILLARY: 141 mg/dL — AB (ref 70–99)

## 2018-07-21 MED ORDER — CLONAZEPAM 0.5 MG PO TABS
1.0000 mg | ORAL_TABLET | Freq: Once | ORAL | Status: AC
  Administered 2018-07-21: 1 mg via ORAL
  Filled 2018-07-21: qty 2

## 2018-07-21 MED ORDER — DIPHENHYDRAMINE HCL 50 MG/ML IJ SOLN
12.5000 mg | Freq: Once | INTRAMUSCULAR | Status: DC
  Filled 2018-07-21: qty 0.25

## 2018-07-21 MED ORDER — DIVALPROEX SODIUM 500 MG PO DR TAB
500.0000 mg | DELAYED_RELEASE_TABLET | Freq: Two times a day (BID) | ORAL | Status: DC
  Administered 2018-07-21: 11:00:00 500 mg via ORAL
  Filled 2018-07-21 (×2): qty 1

## 2018-07-21 MED ORDER — LORAZEPAM 2 MG/ML IJ SOLN: 1.0000 mg | INTRAMUSCULAR | Status: DC | PRN

## 2018-07-21 MED ORDER — CLONAZEPAM 0.5 MG PO TABS
1.0000 mg | ORAL_TABLET | Freq: Three times a day (TID) | ORAL | Status: DC
  Administered 2018-07-21: 09:00:00 1 mg via ORAL
  Filled 2018-07-21: qty 2

## 2018-07-21 MED ORDER — DIVALPROEX SODIUM 500 MG PO DR TAB: 500.0000 mg | DELAYED_RELEASE_TABLET | Freq: Two times a day (BID) | ORAL | 1 refills | Status: DC

## 2018-07-21 MED ORDER — LORAZEPAM 2 MG/ML IJ SOLN
2.0000 mg | Freq: Once | INTRAMUSCULAR | Status: AC
  Administered 2018-07-21: 2 mg via INTRAVENOUS
  Filled 2018-07-21: qty 1

## 2018-07-21 NOTE — Progress Notes (Signed)
Patient refuses bed and chair alarms. Patient educated on purpose of alarms. 

## 2018-07-21 NOTE — Discharge Summary (Signed)
[facility] [place] [facility]   PATIENT NAME: Oscar Duke    MR#:  [id number]  DATE OF BIRTH:  1971-09-27  DATE OF ADMISSION:  07/20/2018   ADMITTING PHYSICIAN: Oralia Manis, MD  DATE OF DISCHARGE: No discharge date for patient encounter.  PRIMARY CARE PHYSICIAN: Duffy Rhody, FNP   ADMISSION DIAGNOSIS:  Seizure (HCC) [R56.9] Hypoglycemia [E16.2] DISCHARGE DIAGNOSIS:  Principal Problem:   Seizures (HCC) Active Problems:   Hypertension   Diabetes mellitus without complication (HCC)   Schizoaffective disorder, bipolar type (HCC)   Hyponatremia   GERD (gastroesophageal reflux disease)  SECONDARY DIAGNOSIS:   Past Medical History:  Diagnosis Date  . Bipolar 1 disorder (HCC)   . Diabetes mellitus without complication (HCC)   . Hyperlipidemia   . Hypertension   . 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 Schizophrenia, acute (HCC)   . Seizures [facility])    HOSPITAL COURSE:  Seizures (HCC) -patient was given IV Keppra in the ED His cognition has returned his baseline. Continue Depakote and keppra.   Hyponatremia, chronic, stable, on scheduled 1 g sodium chloride tablet.    Hypertension -stable tonight, continue home meds   Diabetes mellitus without complication (HCC) -sliding scale insulin with corresponding glucose checks   Schizoaffective disorder, bipolar type (HCC) -continue medications.   GERD (gastroesophageal reflux disease) -Home dose PPI  DISCHARGE CONDITIONS:  Stable, discharge to group home today. CONSULTS OBTAINED:  Treatment Team:  Thana Farr, MD DRUG ALLERGIES:   Allergies  Allergen Reactions  . Haldol [Haloperidol Lactate]   . Inderal [Propranolol]   . Januvia [Sitagliptin] Other (See Comments)    Skin discoloration on lower legs  . Lithium Other (See Comments)    Seizures   DISCHARGE MEDICATIONS:   Allergies as of 07/21/2018      Reactions   Haldol [haloperidol Lactate]    Inderal  [propranolol]    Januvia [sitagliptin] Other (See Comments)   Skin discoloration on lower legs   Lithium Other (See Comments)   Seizures      Medication List    TAKE these medications   acetaminophen 500 MG tablet Commonly known as:  TYLENOL Take 1 tablet (500 mg total) by mouth every 6 (six) hours as needed for mild pain, moderate pain, fever or headache.   amLODipine 5 MG tablet Commonly known as:  NORVASC Take 1 tablet (5 mg total) by mouth daily.   benztropine 1 MG tablet Commonly known as:  COGENTIN Take 1 tablet (1 mg total) by mouth 2 (two) times daily.   clonazePAM 1 MG tablet Commonly known as:  KLONOPIN Take 1 tablet (1 mg total) by mouth 3 (three) times daily.   divalproex 500 MG DR tablet Commonly known as:  DEPAKOTE Take 1 tablet (500 mg total) by mouth every 12 (twelve) hours. What changed:  when to take this   fluPHENAZine 10 MG tablet Commonly known as:  PROLIXIN Take 10 mg by mouth 3 (three) times daily.   ibuprofen 400 MG tablet Commonly known as:  ADVIL,MOTRIN Take 1 tablet (400 mg total) by mouth every 6 (six) hours as needed for fever, headache, mild pain, moderate pain or cramping.   insulin aspart 100 UNIT/ML injection Commonly known as:  novoLOG Inject 0-15 Units into the skin 3 (three) times daily with meals. CBG < 70: implement hypoglycemia protocol CBG 70 - 120: 0 units CBG 121 - 150: 2 units CBG 151 - 200: 3 units CBG 201 - 250:  5 units CBG 251 - 300: 8 units CBG 301 - 350: 11 units CBG 351 - 400: 15 units CBG > 400 call MD and obtain STAT lab verification   insulin detemir 100 UNIT/ML injection Commonly known as:  LEVEMIR Inject 0.28 mLs (28 Units total) into the skin daily.   lactulose 10 GM/15ML solution Commonly known as:  CHRONULAC Take 15 mLs (10 g total) by mouth 2 (two) times daily.   levETIRAcetam 1000 MG tablet Commonly known as:  KEPPRA Take 1 tablet (1,000 mg total) by mouth at bedtime.   lisinopril 20 MG  tablet Commonly known as:  PRINIVIL,ZESTRIL Take 1 tablet (20 mg total) by mouth daily.   pantoprazole 40 MG tablet Commonly known as:  PROTONIX Take 1 tablet (40 mg total) by mouth daily.   perphenazine 8 MG tablet Commonly known as:  TRILAFON Take 1 tablet (8 mg total) by mouth 3 (three) times daily.   sodium chloride 1 g tablet Take 1 tablet (1 g total) by mouth daily.   traZODone 100 MG tablet Commonly known as:  DESYREL Take 1 tablet (100 mg total) by mouth at bedtime.   Vitamin D3 2000 units Tabs Take 2,000 Units by mouth daily.        DISCHARGE INSTRUCTIONS:  See AVS.  If you experience worsening of your admission symptoms, develop shortness of breath, life threatening emergency, suicidal or homicidal thoughts you must seek medical attention immediately by calling 911 or calling your MD immediately  if symptoms less severe.  You Must read complete instructions/literature along with all the possible adverse reactions/side effects for all the Medicines you take and that have been prescribed to you. Take any new Medicines after you have completely understood and accpet all the possible adverse reactions/side effects.   Please note  You were cared for by a hospitalist during your hospital stay. If you have any questions about your discharge medications or the care you received while you were in the hospital after you are discharged, you can call the unit and asked to speak with the hospitalist on call if the hospitalist that took care of you is not available. Once you are discharged, your primary care physician will handle any further medical issues. Please note that NO REFILLS for any discharge medications will be authorized once you are discharged, as it is imperative that you return to your primary care physician (or establish a relationship with a primary care physician if you do not have one) for your aftercare needs so that they can reassess your need for medications and  monitor your lab values.    On the day of Discharge:  VITAL SIGNS:  Blood pressure 140/73, pulse (!) 105, temperature (!) 97.5 F (36.4 C), temperature source Oral, resp. rate 20, height 6\' 6"  (1.981 m), weight 108.9 kg, SpO2 100 %. PHYSICAL EXAMINATION:  GENERAL:  47 y.o.-year-old patient lying in the bed with no acute distress.  EYES: Pupils equal, round, reactive to light and accommodation. No scleral icterus. Extraocular muscles intact.  HEENT: Head atraumatic, normocephalic. Oropharynx and nasopharynx clear.  NECK:  Supple, no jugular venous distention. No thyroid enlargement, no tenderness.  LUNGS: Normal breath sounds bilaterally, no wheezing, rales,rhonchi or crepitation. No use of accessory muscles of respiration.  CARDIOVASCULAR: S1, S2 normal. No murmurs, rubs, or gallops.  ABDOMEN: Soft, non-tender, non-distended. Bowel sounds present. No organomegaly or mass.  EXTREMITIES: No pedal edema, cyanosis, or clubbing.  NEUROLOGIC: Cranial nerves II through XII are intact. Muscle strength  5/5 in all extremities. Sensation intact. Gait not checked.  PSYCHIATRIC: The patient is alert and oriented x 3.  SKIN: No obvious rash, lesion, or ulcer.  DATA REVIEW:   CBC Recent Labs  Lab 07/21/18 0059  WBC 7.5  HGB 12.0*  HCT 33.6*  PLT 161    Chemistries  Recent Labs  Lab 07/21/18 0059  NA 129*  K 3.8  CL 95*  CO2 26  GLUCOSE 149*  BUN 8  CREATININE 0.80  CALCIUM 8.8*     Microbiology Results  Results for orders placed or performed during the hospital encounter of 07/09/18  MRSA PCR Screening     Status: None   Collection Time: 07/10/18  4:39 AM  Result Value Ref Range Status   MRSA by PCR NEGATIVE NEGATIVE Final    Comment:        The GeneXpert MRSA Assay (FDA approved for NASAL specimens only), is one component of a comprehensive MRSA colonization surveillance program. It is not intended to diagnose MRSA infection nor to guide or monitor treatment for MRSA  infections. Performed at [facility], [address] [place], [place] [address]     RADIOLOGY:  Ct Head Wo Contrast  Result Date: 07/20/2018 CLINICAL DATA:  Right periorbital abrasion after a seizure with a fall. EXAM: CT HEAD WITHOUT CONTRAST TECHNIQUE: Contiguous axial images were obtained from the base of the skull through the vertex without intravenous contrast. COMPARISON:  05/17/2018 FINDINGS: Brain: Stable findings of atrophy advanced for age and mild prominence of the ventricular system which is likely ex vacuo and not changed from prior. Otherwise, the brainstem, cerebellum, cerebral peduncles, thalami, basal ganglia, basilar cisterns, and ventricular system appear within normal limits. No intracranial hemorrhage, mass lesion, or acute CVA. Vascular: Atherosclerotic calcification of the cavernous carotid arteries and of the vertebral arteries. Skull: Unremarkable Sinuses/Orbits: Chronic bilateral maxillary sinusitis. Other: Right periorbital soft tissue swelling. IMPRESSION: 1. No acute intracranial findings. 2. Right periorbital soft tissue swelling. 3. Somewhat advanced for age atrophy, with mild chronic prominence of the ventricular system which is likely ex vacuo. 4. Atherosclerosis. 5. Chronic bilateral maxillary sinusitis. Electronically Signed   By: Gaylyn Rong M.D.   On: 07/20/2018 18:39     Management plans discussed with the patient, family and they are in agreement.  CODE STATUS: Full Code   TOTAL TIME TAKING CARE OF THIS PATIENT: 33 minutes.    Shaune Pollack M.D on 07/21/2018 at 10:58 AM  Between 7am to 6pm - Pager - [phone]  After 6pm go to [email or web address] - Social research officer, government  [facility]  Office  [phone]  CC: Primary care physician; Duffy Rhody, FNP   Note: This dictation was prepared with Dragon dictation along with smaller phrase technology. Any transcriptional errors that result from this process are  unintentional.

## 2018-07-21 NOTE — Progress Notes (Signed)
Patient discharged to [facility] assisted living. Discharge instructions given to facility representative and all questions answered. Prescriptions given.

## 2018-07-21 NOTE — Progress Notes (Signed)
Dr Imogene Burn notified via text that patient refuses to wear cardiac monitor.

## 2018-07-21 NOTE — Consult Note (Signed)
Reason for Consult: Seizure Referring Physician: Oralia Manis, MD  CC: Seizure episode  HPI: Oscar Duke is an 47 y.o. male with significant history as below presenting to the ED from [facility] family care with episode of seizure activity and laceration to his right forehead and eye. He report that he thinks he had a seizure and when he woke up he was on the floor bleeding from his right side of face. He does not recall the events prior to or following the event. He denies loss of bladder control or headache post seizure activity. Patient state that he knows that he has been taking his medication like he should and denies missing any doses. He denies use of alcohol or starting any new medication prior to the episode. He is currently taking Keppra 1000 mg at bedtime. He is also on Depakote for mood stabilization but this was previously adjusted by his psychiatrist. He had CT head which did not show any traumatic bleed.  Past Medical History:  Diagnosis Date  . Bipolar 1 disorder (HCC)   . Diabetes mellitus without complication (HCC)   . Hyperlipidemia   . Hypertension   . 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 Schizophrenia, acute (HCC)   . Seizures (HCC)     Past Surgical History:  Procedure Laterality Date  . JOINT REPLACEMENT      History reviewed. No pertinent family history.  Social History:  reports that he has never smoked. He has never used smokeless tobacco. He reports that he has current or past drug history. Drug: Marijuana. He reports that he does not drink alcohol.  Allergies  Allergen Reactions  . Haldol [Haloperidol Lactate]   . Inderal [Propranolol]   . Januvia [Sitagliptin] Other (See Comments)    Skin discoloration on lower legs  . Lithium Other (See Comments)    Seizures    Medications:  I have reviewed the patient's current medications. Prior to Admission:  Medications Prior to Admission  Medication Sig Dispense Refill Last Dose  .  acetaminophen (TYLENOL) 500 MG tablet Take 1 tablet (500 mg total) by mouth every 6 (six) hours as needed for mild pain, moderate pain, fever or headache. 30 tablet 0 Unknown at PRN  . amLODipine (NORVASC) 5 MG tablet Take 1 tablet (5 mg total) by mouth daily. 30 tablet 0 07/20/2018 at 0800  . benztropine (COGENTIN) 1 MG tablet Take 1 tablet (1 mg total) by mouth 2 (two) times daily. 60 tablet 0 07/20/2018 at 0800  . Cholecalciferol (VITAMIN D3) 2000 units TABS Take 2,000 Units by mouth daily. 30 tablet 0 07/20/2018 at 0800  . fluPHENAZine (PROLIXIN) 10 MG tablet Take 10 mg by mouth 3 (three) times daily.  5 07/20/2018 at 0800  . insulin detemir (LEVEMIR) 100 UNIT/ML injection Inject 0.28 mLs (28 Units total) into the skin daily. 10 mL 0 07/20/2018 at 0800  . lactulose (CHRONULAC) 10 GM/15ML solution Take 15 mLs (10 g total) by mouth 2 (two) times daily. 900 mL 0 07/20/2018 at 0800  . levETIRAcetam (KEPPRA) 1000 MG tablet Take 1 tablet (1,000 mg total) by mouth at bedtime. 30 tablet 0 07/19/2018 at 2000  . lisinopril (PRINIVIL,ZESTRIL) 20 MG tablet Take 1 tablet (20 mg total) by mouth daily. 30 tablet 0 07/20/2018 at 0800  . pantoprazole (PROTONIX) 40 MG tablet Take 1 tablet (40 mg total) by mouth daily. 30 tablet 0 07/20/2018 at 0800  . sodium chloride 1 g tablet Take 1 tablet (1 g  total) by mouth daily. 30 tablet 0 07/20/2018 at 0800  . traZODone (DESYREL) 100 MG tablet Take 1 tablet (100 mg total) by mouth at bedtime. 30 tablet 0 07/19/2018 at 2000  . clonazePAM (KLONOPIN) 1 MG tablet Take 1 tablet (1 mg total) by mouth 3 (three) times daily. (Patient not taking: Reported on 07/20/2018) 90 tablet 0 Not Taking at Unknown time  . divalproex (DEPAKOTE) 500 MG DR tablet Take 1 tablet (500 mg total) by mouth at bedtime. (Patient not taking: Reported on 07/20/2018)   Not Taking at Unknown time  . ibuprofen (ADVIL,MOTRIN) 400 MG tablet Take 1 tablet (400 mg total) by mouth every 6 (six) hours as needed for fever, headache, mild  pain, moderate pain or cramping. (Patient not taking: Reported on 07/20/2018) 30 tablet 0 Not Taking at Unknown time  . insulin aspart (NOVOLOG) 100 UNIT/ML injection Inject 0-15 Units into the skin 3 (three) times daily with meals. CBG < 70: implement hypoglycemia protocol CBG 70 - 120: 0 units CBG 121 - 150: 2 units CBG 151 - 200: 3 units CBG 201 - 250: 5 units CBG 251 - 300: 8 units CBG 301 - 350: 11 units CBG 351 - 400: 15 units CBG > 400 call MD and obtain STAT lab verification (Patient not taking: Reported on 07/09/2018) 13.5 mL 0 Not Taking at Unknown time  . perphenazine (TRILAFON) 8 MG tablet Take 1 tablet (8 mg total) by mouth 3 (three) times daily. (Patient not taking: Reported on 07/20/2018) 90 tablet 0 Not Taking at Unknown time   Scheduled: . amLODipine  5 mg Oral Daily  . benztropine  1 mg Oral BID  . clonazePAM  1 mg Oral TID  . diphenhydrAMINE  12.5 mg Intravenous Once  . enoxaparin (LOVENOX) injection  40 mg Subcutaneous Q24H  . fluPHENAZine  10 mg Oral TID  . folic acid  1 mg Oral Daily  . insulin aspart  0-5 Units Subcutaneous QHS  . insulin aspart  0-9 Units Subcutaneous TID WC  . lactulose  10 g Oral BID  . levETIRAcetam  1,000 mg Oral QHS  . pantoprazole  40 mg Oral Daily  . sodium chloride  1 g Oral Daily  . traZODone  100 mg Oral QHS    ROS: History obtained from the patient   General ROS: negative for - chills, fatigue, fever, night sweats, weight gain or weight loss Psychological ROS: Positive for - behavioral disorder, hallucinations, memory difficulties, mood swings. Negative for  suicidal ideation Ophthalmic ROS: negative for - blurry vision, double vision, eye pain or loss of vision ENT ROS: negative for - epistaxis, nasal discharge, oral lesions, sore throat, tinnitus or vertigo Allergy and Immunology ROS: negative for - hives or itchy/watery eyes Hematological and Lymphatic ROS: negative for - bleeding problems, bruising or swollen lymph  nodes Endocrine ROS: negative for - galactorrhea, hair pattern changes, polydipsia/polyuria or temperature intolerance Respiratory ROS: negative for - cough, hemoptysis, shortness of breath or wheezing Cardiovascular ROS: negative for - chest pain, dyspnea on exertion, edema or irregular heartbeat Gastrointestinal ROS: negative for - abdominal pain, diarrhea, hematemesis, nausea/vomiting or stool incontinence Genito-Urinary ROS: negative for - dysuria, hematuria, incontinence or urinary frequency/urgency Musculoskeletal ROS: negative for - joint swelling or muscular weakness Neurological ROS: as noted in HPI Dermatological ROS: negative for rash and skin lesion changes. Positive for laceration to right forehead  Physical Exam   Vitals Blood pressure 140/73, pulse (!) 105, temperature (!) 97.5 F (36.4 C), temperature  source Oral, resp. rate 20, height 6\' 6"  (1.981 m), weight 108.9 kg, SpO2 100 %.  Neurological Exam . Alert,  . Oriented to time, place, person  . Attention span and concentration seemed appropriate  . Language seemed intact (naming, spontaneous speech, comprehension)  . I. Olfactory not examined . II: Visual fields were full. Pupils were equal, round and reactive to light and accommodation . III,IV, VI: ptosis not present, extra-ocular motions intact bilaterally . V,VII: smile symmetric, facial light touch sensation normal bilaterally . VIII: Finger rub was heard symmetric in both ears . IX, X: Palate and uvular movements are normal and oral sensations are OK, gag reflex deffered . XI: Neck muscle strength and shoulder shrug is normal . XII: midline tongue extension . Tone is normal in all extremities, no abnormal movements seen  . Muscle strength in all extremities seemed normal.  . Deep tendon reflexes were symmetric  . Sensations were intact to light touch in all extremities  . Gait and station are normal when examined in small exam room  Laboratory Studies:    Basic Metabolic Panel: Recent Labs  Lab 07/20/18 1755 07/21/18 0059  NA 123* 129*  K 3.8 3.8  CL 91* 95*  CO2 24 26  GLUCOSE 130* 149*  BUN 8 8  CREATININE 0.76 0.80  CALCIUM 8.9 8.8*    Liver Function Tests: No results for input(s): AST, ALT, ALKPHOS, BILITOT, PROT, ALBUMIN in the last 168 hours. No results for input(s): LIPASE, AMYLASE in the last 168 hours. No results for input(s): AMMONIA in the last 168 hours.  CBC: Recent Labs  Lab 07/20/18 1755 07/21/18 0059  WBC 6.2 7.5  HGB 12.2* 12.0*  HCT 33.2* 33.6*  MCV 88.3 90.2  PLT 171 161    Cardiac Enzymes: No results for input(s): CKTOTAL, CKMB, CKMBINDEX, TROPONINI in the last 168 hours.  BNP: Invalid input(s): POCBNP  CBG: Recent Labs  Lab 07/20/18 2208 07/21/18 0737  GLUCAP 129* 115*    Microbiology: Results for orders placed or performed during the hospital encounter of 07/09/18  MRSA PCR Screening     Status: None   Collection Time: 07/10/18  4:39 AM  Result Value Ref Range Status   MRSA by PCR NEGATIVE NEGATIVE Final    Comment:        The GeneXpert MRSA Assay (FDA approved for NASAL specimens only), is one component of a comprehensive MRSA colonization surveillance program. It is not intended to diagnose MRSA infection nor to guide or monitor treatment for MRSA infections. Performed at [facility], [address] [place], [place] [address]     Coagulation Studies: No results for input(s): LABPROT, INR in the last 72 hours.  Urinalysis:  Recent Labs  Lab 07/20/18 1818  COLORURINE YELLOW*  LABSPEC 1.009  PHURINE 6.0  GLUCOSEU NEGATIVE  HGBUR SMALL*  BILIRUBINUR NEGATIVE  KETONESUR 5*  PROTEINUR NEGATIVE  NITRITE NEGATIVE  LEUKOCYTESUR NEGATIVE    Lipid Panel:     Component Value Date/Time   CHOL 93 05/15/2018 0625   TRIG 61 05/15/2018 0625   HDL 45 05/15/2018 0625   CHOLHDL 2.1 05/15/2018 0625   VLDL 12 05/15/2018 0625   LDLCALC 36 05/15/2018 0625     HgbA1C:  Lab Results  Component Value Date   HGBA1C 5.6 05/15/2018    Urine Drug Screen:      Component Value Date/Time   LABOPIA NONE DETECTED 07/20/2018 1818   LABOPIA NONE DETECTED 10/13/2017 1311   COCAINSCRNUR NONE DETECTED  07/20/2018 1818   LABBENZ NONE DETECTED 07/20/2018 1818   LABBENZ POSITIVE (A) 10/13/2017 1311   AMPHETMU NONE DETECTED 07/20/2018 1818   AMPHETMU NONE DETECTED 10/13/2017 1311   THCU NONE DETECTED 07/20/2018 1818   THCU NONE DETECTED 10/13/2017 1311   LABBARB NONE DETECTED 07/20/2018 1818   LABBARB NONE DETECTED 10/13/2017 1311    Alcohol Level:  Recent Labs  Lab 07/20/18 1755  ETH <10    Other results: EKG: normal EKG, normal sinus rhythm, unchanged from previous tracings.  Imaging: Ct Head Wo Contrast  Result Date: 07/20/2018 CLINICAL DATA:  Right periorbital abrasion after a seizure with a fall. EXAM: CT HEAD WITHOUT CONTRAST TECHNIQUE: Contiguous axial images were obtained from the base of the skull through the vertex without intravenous contrast. COMPARISON:  05/17/2018 FINDINGS: Brain: Stable findings of atrophy advanced for age and mild prominence of the ventricular system which is likely ex vacuo and not changed from prior. Otherwise, the brainstem, cerebellum, cerebral peduncles, thalami, basal ganglia, basilar cisterns, and ventricular system appear within normal limits. No intracranial hemorrhage, mass lesion, or acute CVA. Vascular: Atherosclerotic calcification of the cavernous carotid arteries and of the vertebral arteries. Skull: Unremarkable Sinuses/Orbits: Chronic bilateral maxillary sinusitis. Other: Right periorbital soft tissue swelling. IMPRESSION: 1. No acute intracranial findings. 2. Right periorbital soft tissue swelling. 3. Somewhat advanced for age atrophy, with mild chronic prominence of the ventricular system which is likely ex vacuo. 4. Atherosclerosis. 5. Chronic bilateral maxillary sinusitis. Electronically Signed   By:  Gaylyn Rong M.D.   On: 07/20/2018 18:39    Patient seen and examined.  Clinical course and management discussed.  Necessary edits performed.  I agree with the above.  Assessment and plan of care developed and discussed below.   Assessment: 47 y.o. male presenting with presumed seizure with a fall causing injury to right forehead and eye. Patient with a history of seizures.  On Depakote and Keppra.  Unable to get history of AED dosing but patient on a significantly decreased dose of Depakote as compared to last hospitalization.  Previously on 2000mg  daily and admitted on 500mg  at night.  Depakote level of 54. CT head personally reviewed which did not show any acute intracranial abnormalities.    Plan 1. Increase Depakote to 500 mg DR two times a day 2. Continue Keppra 1000 mg once a day at bedtime 3. Recommend outpatient follow up with Neurology for seizure management.  This patient was staffed with Dr. Verlon Au, Thad Ranger who personally evaluated patient, reviewed documentation and agreed with assessment and plan of care as above.  Webb Silversmith, DNP, FNP-BC Board certified Nurse Practitioner Neurology Department  07/21/2018, 8:59 AM    Thana Farr, MD Neurology [phone]  07/21/2018  1:04 PM

## 2018-07-21 NOTE — Clinical Social Work Note (Signed)
Clinical Social Work Assessment  Patient Details  Name: Oscar Duke MRN: [id number] Date of Birth: 05-Apr-1971  Date of referral:  07/21/18               Reason for consult:  Facility Placement                Permission sought to share information with:  Case Manager, Magazine features editor, Family Supports Permission granted to share information::  Yes, Verbal Permission Granted  Name::        Agency::     Relationship::     Contact Information:     Housing/Transportation Living arrangements for the past 2 months:  Group Home Source of Information:  Guardian Patient Interpreter Needed:  None Criminal Activity/Legal Involvement Pertinent to Current Situation/Hospitalization:  No - Comment as needed Significant Relationships:  Other Family Members Lives with:  Facility Resident Do you feel safe going back to the place where you live?  Yes Need for family participation in patient care:  Yes (Comment)  Care giving concerns:  Patient lives in a group home    Social Worker assessment / plan:  CSW notified by RN that patient is from [facility] home. CSW attempted to meet with patient but he is not cooperative with assessment. Patient is verbally abusive and refusing to speak with CSW. CSW contacted patient's guardian Oscar Duke [phone]. Guardian states that patient is from [facility] and has not lived there very long. Guardian states that patient should return to group home when ready for discharge. CSW notified guardian that patient is ready for discharge today. Guardian is in agreement with discharge. CSW contacted [facility] and informed them that patient is ready for discharge today. Group home staff state that patient will be transported by cab per Oscar Duke, group home director. CSW attempted to reach [facility] but was unsuccessful. CSW will continue to work on discharge plan.   Employment status:  Unemployed Chief Financial Officer:  Medicare PT Recommendations:  Not assessed at this time Information / Referral to community resources:     Patient/Family's Response to care:  Patient is verbally abusive and refuses to speak with CSW   Patient/Family's Understanding of and Emotional Response to Diagnosis, Current Treatment, and Prognosis:  Guardian is in agreement with discharge plan.   Emotional Assessment Appearance:  Appears stated age Attitude/Demeanor/Rapport:  Angry, Aggressive (Verbally and/or physically) Affect (typically observed):  Agitated Orientation:  Oriented to Self, Oriented to Place Alcohol / Substance use:  Not Applicable Psych involvement (Current and /or in the community):  No (Comment)  Discharge Needs  Concerns to be addressed:  Discharge Planning Concerns Readmission within the last 30 days:  Yes Current discharge risk:  None Barriers to Discharge:  Continued Medical Work up   [employer], LCSWA 07/21/2018, 8:58 AM

## 2018-07-21 NOTE — Progress Notes (Signed)
Patient refuses to keep telemetry box, MD aware. Patient verbally aggressive at times, MD aware.

## 2018-07-21 NOTE — Clinical Social Work Note (Addendum)
Patient is medically ready for discharge today. CSW notified Haze Justin, group home director of discharge. She states that her staff will pick up patient today. CSW also notified patient's guardian, Randell Loop of discharge today.  CSW notified RN of above.   Ruthe Mannan MSW, [address] [phone]

## 2018-07-21 NOTE — NC FL2 (Addendum)
[place] MEDICAID FL2 LEVEL OF CARE SCREENING TOOL     IDENTIFICATION  Patient Name: Oscar Duke Birthdate: [birth year] Sex: male Admission Date (Current Location): 07/20/2018  [place] and [place] Number:  Chiropodist and Address:  [facility], [address] [place], [place] [address]      Provider Number: [id number]  Attending Physician Name and Address:  Oscar Pollack, MD  Relative Name and Phone Number:       Current Level of Care: Hospital Recommended Level of Care: Other (Comment)(Group Home ) Prior Approval Number:    Date Approved/Denied:   PASRR Number:    Discharge Plan: Other (Comment)(Group Home )    Current Diagnoses: Patient Active Problem List   Diagnosis Date Noted  . GERD (gastroesophageal reflux disease) 07/20/2018  . Increased ammonia level 07/09/2018  . Hyponatremia 05/28/2018  . Schizoaffective disorder, bipolar type (HCC) 05/14/2018  . Somnolence 10/05/2017  . Adverse drug interaction with prescription medication 10/05/2017  . Hypertension   . Diabetes mellitus without complication (HCC)   . Bipolar 1 disorder (HCC)   . Seizures (HCC)   . Schizophrenia, acute (HCC) 05/14/2017    Orientation RESPIRATION BLADDER Height & Weight     Self, Place  Normal Continent Weight: 240 lb 1.6 oz (108.9 kg) Height:  6\' 6"  (198.1 cm)  BEHAVIORAL SYMPTOMS/MOOD NEUROLOGICAL BOWEL NUTRITION STATUS  (None) (None) Continent Diet(Heart Healthy )  AMBULATORY STATUS COMMUNICATION OF NEEDS Skin   Limited Assist Verbally Normal                       Personal Care Assistance Level of Assistance  Bathing, Feeding, Dressing Bathing Assistance: Limited assistance Feeding assistance: Independent Dressing Assistance: Limited assistance     Functional Limitations Info  Sight, Hearing, Speech Sight Info: Adequate Hearing Info: Adequate Speech Info: Adequate    SPECIAL CARE FACTORS FREQUENCY                        Contractures Contractures Info: Not present    Additional Factors Info  Code Status, Allergies Code Status Info: Full Code  Allergies Info: Haldol Haloperidol Lactate, Inderal Propranolol, Januvia Sitagliptin, Lithium           TAKE these medications   acetaminophen 500 MG tablet Commonly known as:  TYLENOL Take 1 tablet (500 mg total) by mouth every 6 (six) hours as needed for mild pain, moderate pain, fever or headache.   amLODipine 5 MG tablet Commonly known as:  NORVASC Take 1 tablet (5 mg total) by mouth daily.   benztropine 1 MG tablet Commonly known as:  COGENTIN Take 1 tablet (1 mg total) by mouth 2 (two) times daily.   clonazePAM 1 MG tablet Commonly known as:  KLONOPIN Take 1 tablet (1 mg total) by mouth 3 (three) times daily.   divalproex 500 MG DR tablet Commonly known as:  DEPAKOTE Take 1 tablet (500 mg total) by mouth every 12 (twelve) hours. What changed:  when to take this   fluPHENAZine 10 MG tablet Commonly known as:  PROLIXIN Take 10 mg by mouth 3 (three) times daily.   ibuprofen 400 MG tablet Commonly known as:  ADVIL,MOTRIN Take 1 tablet (400 mg total) by mouth every 6 (six) hours as needed for fever, headache, mild pain, moderate pain or cramping.   insulin aspart 100 UNIT/ML injection Commonly known as:  novoLOG Inject 0-15 Units into the skin 3 (three) times daily  with meals. CBG < 70: implement hypoglycemia protocol CBG 70 - 120: 0 units CBG 121 - 150: 2 units CBG 151 - 200: 3 units CBG 201 - 250: 5 units CBG 251 - 300: 8 units CBG 301 - 350: 11 units CBG 351 - 400: 15 units CBG > 400 call MD and obtain STAT lab verification   insulin detemir 100 UNIT/ML injection Commonly known as:  LEVEMIR Inject 0.28 mLs (28 Units total) into the skin daily.   lactulose 10 GM/15ML solution Commonly known as:  CHRONULAC Take 15 mLs (10 g total) by mouth 2 (two) times daily.   levETIRAcetam 1000 MG tablet Commonly known as:   KEPPRA Take 1 tablet (1,000 mg total) by mouth at bedtime.   lisinopril 20 MG tablet Commonly known as:  PRINIVIL,ZESTRIL Take 1 tablet (20 mg total) by mouth daily.   pantoprazole 40 MG tablet Commonly known as:  PROTONIX Take 1 tablet (40 mg total) by mouth daily.   perphenazine 8 MG tablet Commonly known as:  TRILAFON Take 1 tablet (8 mg total) by mouth 3 (three) times daily.   sodium chloride 1 g tablet Take 1 tablet (1 g total) by mouth daily.   traZODone 100 MG tablet Commonly known as:  DESYREL Take 1 tablet (100 mg total) by mouth at bedtime.   Vitamin D3 2000 units Tabs Take 2,000 Units by mouth daily.      Discharge Medications: Please see discharge summary for a list of discharge medications.  Relevant Imaging Results:  Relevant Lab Results:   Additional Information    Oscar Duke  Oscar Duke, [address]

## 2018-07-24 ENCOUNTER — Inpatient Hospital Stay
Admission: EM | Admit: 2018-07-24 | Discharge: 2018-07-26 | DRG: 101 | Disposition: A | Discharge status: Home / Self Care | Payer: Medicare Other | Attending: Internal Medicine | Admitting: Internal Medicine

## 2018-07-24 ENCOUNTER — Other Ambulatory Visit: Payer: Self-pay

## 2018-07-24 DIAGNOSIS — Z794 Long term (current) use of insulin: Secondary | ICD-10-CM | POA: Diagnosis not present

## 2018-07-24 DIAGNOSIS — E785 Hyperlipidemia, unspecified: Secondary | ICD-10-CM | POA: Diagnosis present

## 2018-07-24 DIAGNOSIS — Z888 Allergy status to other drugs, medicaments and biological substances status: Secondary | ICD-10-CM

## 2018-07-24 DIAGNOSIS — E11649 Type 2 diabetes mellitus with hypoglycemia without coma: Secondary | ICD-10-CM | POA: Diagnosis present

## 2018-07-24 DIAGNOSIS — E871 Hypo-osmolality and hyponatremia: Secondary | ICD-10-CM | POA: Diagnosis present

## 2018-07-24 DIAGNOSIS — E8779 Other fluid overload: Secondary | ICD-10-CM | POA: Diagnosis present

## 2018-07-24 DIAGNOSIS — F250 Schizoaffective disorder, bipolar type: Secondary | ICD-10-CM | POA: Diagnosis present

## 2018-07-24 DIAGNOSIS — W19XXXA Unspecified fall, initial encounter: Secondary | ICD-10-CM | POA: Diagnosis present

## 2018-07-24 DIAGNOSIS — R631 Polydipsia: Secondary | ICD-10-CM | POA: Diagnosis present

## 2018-07-24 DIAGNOSIS — R569 Unspecified convulsions: Secondary | ICD-10-CM

## 2018-07-24 DIAGNOSIS — G40409 Other generalized epilepsy and epileptic syndromes, not intractable, without status epilepticus: Principal | ICD-10-CM | POA: Diagnosis present

## 2018-07-24 DIAGNOSIS — Z79899 Other long term (current) drug therapy: Secondary | ICD-10-CM

## 2018-07-24 DIAGNOSIS — Z66 Do not resuscitate: Secondary | ICD-10-CM | POA: Diagnosis present

## 2018-07-24 DIAGNOSIS — I10 Essential (primary) hypertension: Secondary | ICD-10-CM | POA: Diagnosis present

## 2018-07-24 DIAGNOSIS — S0181XA Laceration without foreign body of other part of head, initial encounter: Secondary | ICD-10-CM | POA: Diagnosis present

## 2018-07-24 DIAGNOSIS — K219 Gastro-esophageal reflux disease without esophagitis: Secondary | ICD-10-CM | POA: Diagnosis present

## 2018-07-24 LAB — BASIC METABOLIC PANEL
ANION GAP: 12 (ref 5–15)
BUN: 6 mg/dL (ref 6–20)
CALCIUM: 9.3 mg/dL (ref 8.9–10.3)
CO2: 20 mmol/L — ABNORMAL LOW (ref 22–32)
CREATININE: 0.77 mg/dL (ref 0.61–1.24)
Chloride: 92 mmol/L — ABNORMAL LOW (ref 98–111)
Glucose, Bld: 103 mg/dL — ABNORMAL HIGH (ref 70–99)
Potassium: 4.1 mmol/L (ref 3.5–5.1)
Sodium: 124 mmol/L — ABNORMAL LOW (ref 135–145)

## 2018-07-24 LAB — URINALYSIS, COMPLETE (UACMP) WITH MICROSCOPIC
BILIRUBIN URINE: NEGATIVE
Bacteria, UA: NONE SEEN
GLUCOSE, UA: NEGATIVE mg/dL
KETONES UR: NEGATIVE mg/dL
LEUKOCYTES UA: NEGATIVE
NITRITE: NEGATIVE
Protein, ur: NEGATIVE mg/dL
SPECIFIC GRAVITY, URINE: 1.003 — AB (ref 1.005–1.030)
SQUAMOUS EPITHELIAL / LPF: NONE SEEN (ref 0–5)
pH: 7 (ref 5.0–8.0)

## 2018-07-24 LAB — URINE DRUG SCREEN, QUALITATIVE (ARMC ONLY)
AMPHETAMINES, UR SCREEN: NOT DETECTED
BENZODIAZEPINE, UR SCRN: NOT DETECTED
Barbiturates, Ur Screen: NOT DETECTED
CANNABINOID 50 NG, UR ~~LOC~~: NOT DETECTED
Cocaine Metabolite,Ur ~~LOC~~: NOT DETECTED
MDMA (ECSTASY) UR SCREEN: NOT DETECTED
Methadone Scn, Ur: NOT DETECTED
Opiate, Ur Screen: NOT DETECTED
PHENCYCLIDINE (PCP) UR S: NOT DETECTED
TRICYCLIC, UR SCREEN: NOT DETECTED

## 2018-07-24 LAB — CBC WITH DIFFERENTIAL/PLATELET
BASOS ABS: 0 10*3/uL (ref 0–0.1)
Basophils Relative: 1 %
Eosinophils Absolute: 0 10*3/uL (ref 0–0.7)
Eosinophils Relative: 1 %
HEMATOCRIT: 33.1 % — AB (ref 40.0–52.0)
Hemoglobin: 11.9 g/dL — ABNORMAL LOW (ref 13.0–18.0)
LYMPHS PCT: 22 %
Lymphs Abs: 1.8 10*3/uL (ref 1.0–3.6)
MCH: 32.3 pg (ref 26.0–34.0)
MCHC: 36 g/dL (ref 32.0–36.0)
MCV: 89.6 fL (ref 80.0–100.0)
Monocytes Absolute: 0.6 10*3/uL (ref 0.2–1.0)
Monocytes Relative: 8 %
NEUTROS ABS: 5.6 10*3/uL (ref 1.4–6.5)
Neutrophils Relative %: 68 %
PLATELETS: 169 10*3/uL (ref 150–440)
RBC: 3.7 MIL/uL — AB (ref 4.40–5.90)
RDW: 15.4 % — AB (ref 11.5–14.5)
WBC: 8.2 10*3/uL (ref 3.8–10.6)

## 2018-07-24 LAB — URIC ACID: Uric Acid, Serum: 4.3 mg/dL (ref 3.7–8.6)

## 2018-07-24 LAB — VALPROIC ACID LEVEL: Valproic Acid Lvl: 135 ug/mL — ABNORMAL HIGH (ref 50.0–100.0)

## 2018-07-24 LAB — GLUCOSE, CAPILLARY
GLUCOSE-CAPILLARY: 92 mg/dL (ref 70–99)
GLUCOSE-CAPILLARY: 93 mg/dL (ref 70–99)

## 2018-07-24 LAB — TSH: TSH: 1.272 u[IU]/mL (ref 0.350–4.500)

## 2018-07-24 LAB — SODIUM, URINE, RANDOM: Sodium, Ur: 38 mmol/L

## 2018-07-24 MED ORDER — SODIUM CHLORIDE 0.9 % IV SOLN
INTRAVENOUS | Status: AC
  Administered 2018-07-25: 06:00:00 via INTRAVENOUS

## 2018-07-24 MED ORDER — ONDANSETRON HCL 4 MG/2ML IJ SOLN: 4.0000 mg | Freq: Four times a day (QID) | INTRAMUSCULAR | Status: DC | PRN

## 2018-07-24 MED ORDER — LORAZEPAM 2 MG/ML IJ SOLN
INTRAMUSCULAR | Status: AC
  Administered 2018-07-24: 1 mg via INTRAVENOUS
  Filled 2018-07-24: qty 1

## 2018-07-24 MED ORDER — PERPHENAZINE 4 MG PO TABS
8.0000 mg | ORAL_TABLET | Freq: Three times a day (TID) | ORAL | Status: DC
  Administered 2018-07-24 – 2018-07-26 (×5): 8 mg via ORAL
  Filled 2018-07-24 (×7): qty 2

## 2018-07-24 MED ORDER — POLYETHYLENE GLYCOL 3350 17 G PO PACK: 17.0000 g | PACK | Freq: Every day | ORAL | Status: DC | PRN

## 2018-07-24 MED ORDER — FLUPHENAZINE HCL 5 MG PO TABS
10.0000 mg | ORAL_TABLET | Freq: Three times a day (TID) | ORAL | Status: DC
  Administered 2018-07-24 – 2018-07-26 (×6): 10 mg via ORAL
  Filled 2018-07-24 (×8): qty 2

## 2018-07-24 MED ORDER — ONDANSETRON HCL 4 MG PO TABS: 4.0000 mg | ORAL_TABLET | Freq: Four times a day (QID) | ORAL | Status: DC | PRN

## 2018-07-24 MED ORDER — LORAZEPAM 2 MG/ML IJ SOLN
2.0000 mg | Freq: Once | INTRAMUSCULAR | Status: AC
  Administered 2018-07-24: 2 mg via INTRAVENOUS

## 2018-07-24 MED ORDER — LISINOPRIL 20 MG PO TABS
20.0000 mg | ORAL_TABLET | Freq: Every day | ORAL | Status: DC
  Administered 2018-07-25 – 2018-07-26 (×2): 20 mg via ORAL
  Filled 2018-07-24 (×2): qty 1

## 2018-07-24 MED ORDER — VITAMIN D3 25 MCG (1000 UNIT) PO TABS
2000.0000 [IU] | ORAL_TABLET | Freq: Every day | ORAL | Status: DC
  Administered 2018-07-25 – 2018-07-26 (×2): 2000 [IU] via ORAL
  Filled 2018-07-24 (×3): qty 2

## 2018-07-24 MED ORDER — LACTULOSE 10 GM/15ML PO SOLN
10.0000 g | Freq: Two times a day (BID) | ORAL | Status: DC
  Administered 2018-07-24 – 2018-07-25 (×3): 10 g via ORAL
  Filled 2018-07-24 (×3): qty 30

## 2018-07-24 MED ORDER — LEVETIRACETAM IN NACL 1000 MG/100ML IV SOLN
1000.0000 mg | Freq: Once | INTRAVENOUS | Status: DC
  Filled 2018-07-24: qty 100

## 2018-07-24 MED ORDER — BENZTROPINE MESYLATE 1 MG PO TABS
1.0000 mg | ORAL_TABLET | Freq: Two times a day (BID) | ORAL | Status: DC
  Administered 2018-07-24 – 2018-07-26 (×4): 1 mg via ORAL
  Filled 2018-07-24 (×5): qty 1

## 2018-07-24 MED ORDER — LORAZEPAM 2 MG/ML IJ SOLN
INTRAMUSCULAR | Status: AC
  Filled 2018-07-24: qty 1

## 2018-07-24 MED ORDER — INSULIN ASPART 100 UNIT/ML ~~LOC~~ SOLN
0.0000 [IU] | Freq: Three times a day (TID) | SUBCUTANEOUS | Status: DC
  Administered 2018-07-25: 17:00:00 2 [IU] via SUBCUTANEOUS
  Administered 2018-07-25: 1 [IU] via SUBCUTANEOUS
  Filled 2018-07-24 (×2): qty 1

## 2018-07-24 MED ORDER — INSULIN DETEMIR 100 UNIT/ML ~~LOC~~ SOLN
28.0000 [IU] | Freq: Every day | SUBCUTANEOUS | Status: DC
  Administered 2018-07-25 – 2018-07-26 (×2): 28 [IU] via SUBCUTANEOUS
  Filled 2018-07-24 (×2): qty 0.28

## 2018-07-24 MED ORDER — PANTOPRAZOLE SODIUM 40 MG PO TBEC
40.0000 mg | DELAYED_RELEASE_TABLET | Freq: Every day | ORAL | Status: DC
  Administered 2018-07-25 – 2018-07-26 (×2): 40 mg via ORAL
  Filled 2018-07-24 (×2): qty 1

## 2018-07-24 MED ORDER — SODIUM CHLORIDE 1 G PO TABS
1.0000 g | ORAL_TABLET | Freq: Every day | ORAL | Status: DC
  Administered 2018-07-25 – 2018-07-26 (×2): 1 g via ORAL
  Filled 2018-07-24 (×2): qty 1

## 2018-07-24 MED ORDER — SODIUM CHLORIDE 0.9 % IV BOLUS
1000.0000 mL | Freq: Once | INTRAVENOUS | Status: AC
  Administered 2018-07-24: 1000 mL via INTRAVENOUS

## 2018-07-24 MED ORDER — TRAZODONE HCL 50 MG PO TABS
100.0000 mg | ORAL_TABLET | Freq: Every day | ORAL | Status: DC
  Administered 2018-07-24 – 2018-07-25 (×2): 100 mg via ORAL
  Filled 2018-07-24 (×2): qty 2

## 2018-07-24 MED ORDER — CLONAZEPAM 0.5 MG PO TABS
1.0000 mg | ORAL_TABLET | Freq: Four times a day (QID) | ORAL | Status: DC
  Administered 2018-07-24 – 2018-07-26 (×6): 1 mg via ORAL
  Filled 2018-07-24 (×6): qty 2

## 2018-07-24 MED ORDER — AMLODIPINE BESYLATE 5 MG PO TABS
5.0000 mg | ORAL_TABLET | Freq: Every day | ORAL | Status: DC
  Administered 2018-07-25 – 2018-07-26 (×2): 5 mg via ORAL
  Filled 2018-07-24 (×2): qty 1

## 2018-07-24 MED ORDER — HYDROCODONE-ACETAMINOPHEN 5-325 MG PO TABS: 1.0000 | ORAL_TABLET | ORAL | Status: DC | PRN

## 2018-07-24 MED ORDER — ENOXAPARIN SODIUM 40 MG/0.4ML ~~LOC~~ SOLN
40.0000 mg | SUBCUTANEOUS | Status: DC
  Administered 2018-07-24 – 2018-07-25 (×2): 40 mg via SUBCUTANEOUS
  Filled 2018-07-24 (×2): qty 0.4

## 2018-07-24 MED ORDER — LORAZEPAM 2 MG/ML IJ SOLN
1.0000 mg | INTRAMUSCULAR | Status: DC | PRN
  Administered 2018-07-24: 15:00:00 1 mg via INTRAVENOUS
  Filled 2018-07-24: qty 1

## 2018-07-24 MED ORDER — LORAZEPAM 2 MG/ML IJ SOLN
1.0000 mg | Freq: Once | INTRAMUSCULAR | Status: AC
  Administered 2018-07-24: 1 mg via INTRAVENOUS

## 2018-07-24 MED ORDER — CLONAZEPAM 0.5 MG PO TABS
1.0000 mg | ORAL_TABLET | Freq: Three times a day (TID) | ORAL | Status: DC
  Administered 2018-07-24: 1 mg via ORAL
  Filled 2018-07-24: qty 2

## 2018-07-24 NOTE — ED Notes (Signed)
This nurse transported patient to 125 and assisted from stretcher to hospital bed. Bed in lowest position with side rails up. Nurse tech in room at the time that this nurse left. Nurse alerted per secretary that pt was in room.

## 2018-07-24 NOTE — Progress Notes (Signed)
Chaplain heard Pt swearing and shouting. Chaplain came to the door and got permission to enter by the Pt. Chaplain practiced a calming presence and prayer to try to calm the Pt. Pt did calm while medication was administered. Chaplain will follow up.    07/24/18 1126  Clinical Encounter Type  Visited With Patient  Visit Type Initial  Referral From Chaplain  Spiritual Encounters  Spiritual Needs Prayer;Emotional  Advance Directives (For Healthcare)  Does Patient Have a Medical Advance Directive? Yes  Type of Diplomatic Services operational officerAdvance Directive Healthcare Power of Attorney  Mental Health Advance Directives  Does Patient Have a Mental Health Advance Directive? No  Would patient like information on creating a mental health advance directive? No - Patient declined

## 2018-07-24 NOTE — Progress Notes (Signed)
   07/24/18 1500  Clinical Encounter Type  Visited With Patient;Health care provider  Visit Type  (Security Alert)  Referral From Physician  Recommendations Follow-up as needed.  Spiritual Encounters  Spiritual Needs Other (Comment) (See Attached Note)   Chaplain responded to the Security Alert and met the medical team. Patient was already in the process of calming down. The Administrative Coordinator released the response team.

## 2018-07-24 NOTE — Progress Notes (Signed)
   07/24/18 1435  Clinical Encounter Type  Visited With Patient  Visit Type Follow-up;Spiritual support  Referral From Nurse  Consult/Referral To Chaplain  Spiritual Encounters  Spiritual Needs Other (Comment)   CH responded to a security alert for 125[address]. I had just left Mr. Odette HornsGarcia's room so I returned to check on him. The patient was agitated but [facility] and security. I will follow up as needed.

## 2018-07-24 NOTE — ED Notes (Signed)
Attempted to give report. Nurse unable to accept report at this time.

## 2018-07-24 NOTE — NC FL2 (Addendum)
[place] MEDICAID FL2 LEVEL OF CARE SCREENING TOOL     IDENTIFICATION  Patient Name: Oscar Duke Birthdate: [birth year] Sex: male Admission Date (Current Location): 07/24/2018  [place]   Facility and Address[facility]  [facility], [address], [place], [place] [address]      Provider Number: [id number]  Attending Physician Name and Address:  Adrian SaranMody, Sital, MD  Relative Name and Phone Number:       Current Level of Care: Hospital [facility] Prior Approval Number:    Date Approved/Denied:   PASRR Number: [id number]  Discharge Plan: Other (Comment)(Group Home )    Current Diagnoses: Patient Active Problem List   Diagnosis Date Noted  . Seizure (HCC) 07/21/2018  . GERD (gastroesophageal reflux disease) 07/20/2018  . Increased ammonia level 07/09/2018  . Hyponatremia 05/28/2018  . Schizoaffective disorder, bipolar type (HCC) 05/14/2018  . Somnolence 10/05/2017  . Adverse drug interaction with prescription medication 10/05/2017  . Hypertension   . Diabetes mellitus without complication (HCC)   . Bipolar 1 disorder (HCC)   . Seizures (HCC)   . Schizophrenia, acute (HCC) 05/14/2017    Orientation RESPIRATION BLADDER Height & Weight     Self, Time, Place  Normal Continent Weight: 244 lb 11.4 oz (111 kg) Height:  6\' 6"  (198.1 cm)  BEHAVIORAL SYMPTOMS/MOOD NEUROLOGICAL BOWEL NUTRITION STATUS  (none) Convulsions/Seizures, Grand mal Continent Diet(regular)  AMBULATORY STATUS COMMUNICATION OF NEEDS Skin   Limited Assist Verbally Normal                       Personal Care Assistance Level of Assistance  Bathing, Feeding, Dressing Bathing Assistance: Limited assistance Feeding assistance: Independent Dressing Assistance: Limited assistance     Functional Limitations Info  Sight, Speech, Hearing Sight Info: Adequate Hearing Info: Adequate Speech Info: Adequate    SPECIAL  CARE FACTORS FREQUENCY                       Contractures Contractures Info: Not present    Additional Factors Info  Code Status, Allergies Code Status Info: Full Code  Allergies Info: Haldol Haloperidol Lactate, Inderal Propranolol, Januvia Sitagliptin, Lithium           Current Medications (07/24/2018):  This is the current hospital active medication list Current Facility-Administered Medications  Medication Dose Route Frequency Provider Last Rate Last Dose  . 0.9 %  sodium chloride infusion   Intravenous Continuous Adrian SaranMody, Sital, MD      . Melene Muller[START ON 07/25/2018] amLODipine (NORVASC) tablet 5 mg  5 mg Oral Daily Mody, Sital, MD      . benztropine (COGENTIN) tablet 1 mg  1 mg Oral BID Adrian SaranMody, Sital, MD      . Melene Muller[START ON 07/25/2018] cholecalciferol (VITAMIN D) tablet 2,000 Units  2,000 Units Oral Daily Mody, Sital, MD      . clonazePAM (KLONOPIN) tablet 1 mg  1 mg Oral TID Juliene PinaMody, Sital, MD      . enoxaparin (LOVENOX) injection 40 mg  40 mg Subcutaneous Q24H Mody, Sital, MD      . fluPHENAZine (PROLIXIN) tablet 10 mg  10 mg Oral TID Adrian SaranMody, Sital, MD      . HYDROcodone-acetaminophen (NORCO/VICODIN) 5-325 MG per tablet 1-2 tablet  1-2 tablet Oral Q4H PRN Mody, Sital, MD      . insulin aspart (novoLOG) injection 0-9 Units  0-9 Units Subcutaneous TID WC Adrian SaranMody, Sital, MD      . [  START ON 07/25/2018] insulin detemir (LEVEMIR) injection 28 Units  28 Units Subcutaneous Daily Mody, Sital, MD      . lactulose (CHRONULAC) 10 GM/15ML solution 10 g  10 g Oral BID Juliene Pina, Sital, MD      . levETIRAcetam (KEPPRA) IVPB 1000 mg/100 mL premix  1,000 mg Intravenous Once Jeanmarie Plant, MD      . Melene Muller ON 07/25/2018] lisinopril (PRINIVIL,ZESTRIL) tablet 20 mg  20 mg Oral Daily Mody, Sital, MD      . LORazepam (ATIVAN) injection 1 mg  1 mg Intravenous Q4H PRN Mody, Sital, MD      . ondansetron (ZOFRAN) tablet 4 mg  4 mg Oral Q6H PRN Adrian Saran, MD       Or  . ondansetron (ZOFRAN) injection 4 mg  4 mg  Intravenous Q6H PRN Adrian Saran, MD      . Melene Muller ON 07/25/2018] pantoprazole (PROTONIX) EC tablet 40 mg  40 mg Oral Daily Mody, Sital, MD      . polyethylene glycol (MIRALAX / GLYCOLAX) packet 17 g  17 g Oral Daily PRN Adrian Saran, MD      . Melene Muller ON 07/25/2018] sodium chloride tablet 1 g  1 g Oral Daily Mody, Sital, MD      . traZODone (DESYREL) tablet 100 mg  100 mg Oral QHS Adrian Saran, MD         Discharge Medications: Medication List    STOP taking these medications   levETIRAcetam 1000 MG tablet Commonly known as:  KEPPRA Replaced by:  Levetiracetam 750 MG Tb24     TAKE these medications   acetaminophen 500 MG tablet Commonly known as:  TYLENOL Take 1 tablet (500 mg total) by mouth every 6 (six) hours as needed for mild pain, moderate pain, fever or headache.   amLODipine 5 MG tablet Commonly known as:  NORVASC Take 1 tablet (5 mg total) by mouth daily.   benztropine 1 MG tablet Commonly known as:  COGENTIN Take 1 tablet (1 mg total) by mouth 2 (two) times daily.   clonazePAM 1 MG tablet Commonly known as:  KLONOPIN Take 1 tablet (1 mg total) by mouth 4 (four) times daily. What changed:  when to take this   divalproex 500 MG DR tablet Commonly known as:  DEPAKOTE Take 1 tablet (500 mg total) by mouth every 12 (twelve) hours.   fluPHENAZine 10 MG tablet Commonly known as:  PROLIXIN Take 10 mg by mouth 3 (three) times daily.   ibuprofen 400 MG tablet Commonly known as:  ADVIL,MOTRIN Take 1 tablet (400 mg total) by mouth every 6 (six) hours as needed for fever, headache, mild pain, moderate pain or cramping.   insulin aspart 100 UNIT/ML injection Commonly known as:  novoLOG Inject 0-15 Units into the skin 3 (three) times daily with meals. CBG < 70: implement hypoglycemia protocol CBG 70 - 120: 0 units CBG 121 - 150: 2 units CBG 151 - 200: 3 units CBG 201 - 250: 5 units CBG 251 - 300: 8 units CBG 301 - 350: 11 units CBG 351 - 400: 15 units CBG >  400 call MD and obtain STAT lab verification   insulin detemir 100 UNIT/ML injection Commonly known as:  LEVEMIR Inject 0.28 mLs (28 Units total) into the skin daily.   lactulose 10 GM/15ML solution Commonly known as:  CHRONULAC Take 15 mLs (10 g total) by mouth 2 (two) times daily.   Levetiracetam 750 MG Tb24 Take 2 tablets (1,500 mg  total) by mouth daily. Replaces:  levETIRAcetam 1000 MG tablet   lisinopril 20 MG tablet Commonly known as:  PRINIVIL,ZESTRIL Take 1 tablet (20 mg total) by mouth daily.   pantoprazole 40 MG tablet Commonly known as:  PROTONIX Take 1 tablet (40 mg total) by mouth daily.   perphenazine 8 MG tablet Commonly known as:  TRILAFON Take 1 tablet (8 mg total) by mouth 3 (three) times daily.   sodium chloride 1 g tablet Take 1 tablet (1 g total) by mouth daily.   traZODone 100 MG tablet Commonly known as:  DESYREL Take 1 tablet (100 mg total) by mouth at bedtime.   Vitamin D3 2000 units Tabs Take 2,000 Units by mouth daily.     Relevant Imaging Results:  Relevant Lab Results:   Additional Information    Candace  Rinaldo Ratel, [address]

## 2018-07-24 NOTE — ED Provider Notes (Signed)
[facility] Emergency Department Provider Note  ____________________________________________   I have reviewed the triage vital signs and the nursing notes. Where available I have reviewed prior notes and, if possible and indicated, outside hospital notes.    HISTORY  Chief Complaint Seizures    HPI Oscar Duke is a 47 y.o. male  With a history of bipolar disorder, schizophrenia, who presents today after having had a witnessed seizure in the emergency department.  He states that he was here because he did not like his group home.  After arriving however he did have a tonic clonic seizure.  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  Patient's last admission was a few days ago for similar, he was at that time seen by neurology.  Patient was immediately postictal did not fall or hit his head.  Does have a history of recent head trauma with recent negative CT scan from prior seizure.  History is somewhat limited, patient does state that he was here because he did not like his group home.  He states he is compliant with his medications.  There is supposed to be 500 of Depakote nightly.  And a gram of extended release Keppra.  These were not changed during his past seizure admission..   Level 5 chart caveat; no further history available due to patient status.  Past Medical History:  Diagnosis Date  . Bipolar 1 disorder (HCC)   . Diabetes mellitus without complication (HCC)   . Hyperlipidemia   . Hypertension   . 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 Schizophrenia, acute (HCC)   . Seizures [facility])     Patient Active Problem List   Diagnosis Date Noted  . Seizure (HCC) 07/21/2018  . GERD (gastroesophageal reflux disease) 07/20/2018  . Increased ammonia level 07/09/2018  . Hyponatremia 05/28/2018  . Schizoaffective disorder, bipolar type (HCC) 05/14/2018  . Somnolence 10/05/2017  . Adverse drug interaction with  prescription medication 10/05/2017  . Hypertension   . Diabetes mellitus without complication (HCC)   . Bipolar 1 disorder (HCC)   . Seizures (HCC)   . Schizophrenia, acute (HCC) 05/14/2017    Past Surgical History:  Procedure Laterality Date  . JOINT REPLACEMENT      Prior to Admission medications   Medication Sig Start Date End Date Taking? Authorizing Provider  acetaminophen (TYLENOL) 500 MG tablet Take 1 tablet (500 mg total) by mouth every 6 (six) hours as needed for mild pain, moderate pain, fever or headache. 05/28/18  Yes O'Neal, Fransico Setters, MD  amLODipine (NORVASC) 5 MG tablet Take 1 tablet (5 mg total) by mouth daily. 05/28/18  Yes [name], MD  benztropine (COGENTIN) 1 MG tablet Take 1 tablet (1 mg total) by mouth 2 (two) times daily. 05/28/18  Yes [name], MD  Cholecalciferol (VITAMIN D3) 2000 units TABS Take 2,000 Units by mouth daily. 05/30/18  Yes O'Neal, Fransico Setters, MD  clonazePAM (KLONOPIN) 1 MG tablet Take 1 tablet (1 mg total) by mouth 3 (three) times daily. 05/28/18  Yes [name], MD  divalproex (DEPAKOTE) 500 MG DR tablet Take 1 tablet (500 mg total) by mouth every 12 (twelve) hours. 07/21/18  Yes Shaune Pollack, MD  fluPHENAZine (PROLIXIN) 10 MG tablet Take 10 mg by mouth 3 (three) times daily. 07/16/18  Yes [provider]  ibuprofen (ADVIL,MOTRIN) 400 MG tablet Take 1 tablet (400 mg total) by mouth every 6 (six) hours as needed for fever, headache, mild pain, moderate pain  or cramping. 05/28/18  Yes O'Neal, Fransico SettersSarita, MD  insulin detemir (LEVEMIR) 100 UNIT/ML injection Inject 0.28 mLs (28 Units total) into the skin daily. 05/30/18  Yes [name]'Neal, Sarita, MD  lactulose (CHRONULAC) 10 GM/15ML solution Take 15 mLs (10 g total) by mouth 2 (two) times daily. 05/28/18  Yes [name]'Neal, Sarita, MD  levETIRAcetam (KEPPRA) 1000 MG tablet Take 1 tablet (1,000 mg total) by mouth at bedtime. 05/28/18  Yes O'Neal, Fransico SettersSarita, MD  lisinopril (PRINIVIL,ZESTRIL) 20 MG tablet Take 1 tablet (20  mg total) by mouth daily. 05/28/18  Yes O'Neal, Fransico SettersSarita, MD  pantoprazole (PROTONIX) 40 MG tablet Take 1 tablet (40 mg total) by mouth daily. 05/28/18  Yes O'Neal, Fransico SettersSarita, MD  sodium chloride 1 g tablet Take 1 tablet (1 g total) by mouth daily. 05/28/18  Yes [name]'Neal, Sarita, MD  traZODone (DESYREL) 100 MG tablet Take 1 tablet (100 mg total) by mouth at bedtime. 05/28/18  Yes O'Neal, Fransico SettersSarita, MD  insulin aspart (NOVOLOG) 100 UNIT/ML injection Inject 0-15 Units into the skin 3 (three) times daily with meals. CBG < 70: implement hypoglycemia protocol CBG 70 - 120: 0 units CBG 121 - 150: 2 units CBG 151 - 200: 3 units CBG 201 - 250: 5 units CBG 251 - 300: 8 units CBG 301 - 350: 11 units CBG 351 - 400: 15 units CBG > 400 call MD and obtain STAT lab verification Patient not taking: Reported on 07/09/2018 05/28/18   [name]'Neal, Sarita, MD  perphenazine (TRILAFON) 8 MG tablet Take 1 tablet (8 mg total) by mouth 3 (three) times daily. Patient not taking: Reported on 07/20/2018 05/28/18   [name]'Neal, Sarita, MD    Allergies Haldol [haloperidol lactate]; Inderal [propranolol]; Januvia [sitagliptin]; and Lithium  History reviewed. No pertinent family history.  Social History Social History   Tobacco Use  . Smoking status: Never Smoker  . Smokeless tobacco: Never Used  Substance Use Topics  . Alcohol use: No  . Drug use: Yes    Types: Marijuana    Comment: quit 5 years ago    Review of Systems Constitutional: No fever/chills Eyes: No visual changes. ENT: No sore throat. No stiff neck no neck pain Cardiovascular: Denies chest pain. Respiratory: Denies shortness of breath. Gastrointestinal:   no vomiting.  No diarrhea.  No constipation. Genitourinary: Negative for dysuria. Musculoskeletal: Negative lower extremity swelling Skin: Negative for rash. Neurological: Negative for severe headaches, focal weakness or numbness.   ____________________________________________   PHYSICAL EXAM:  VITAL  SIGNS: ED Triage Vitals  Enc Vitals Group     BP 07/24/18 1059 108/81     Pulse Rate 07/24/18 1059 95     Resp 07/24/18 1059 18     Temp 07/24/18 1059 99.1 F (37.3 C)     Temp Source 07/24/18 1059 Oral     SpO2 07/24/18 1059 98 %     Weight 07/24/18 1059 245 lb (111.1 kg)     Height 07/24/18 1059 6\' 6"  (1.981 m)     Head Circumference --      Peak Flow --      Pain Score 07/24/18 1115 0     Pain Loc --      Pain Edu? --      Excl. in GC? --     Constitutional: Usually postictal but then would wake up and tell me his name and where he is postictal and then a little bit angry and upset, patient did receive Ativan for some further seizure activity Eyes: Conjunctivae are  normal Head: Old bruising noted to the right side of the head from prior fall seen a few days ago HEENT: No congestion/rhinnorhea. Mucous membranes are moist.  Oropharynx non-erythematous Neck:   Nontender with no meningismus, no masses, no stridor Cardiovascular: Normal rate, regular rhythm. Grossly normal heart sounds.  Good peripheral circulation. Respiratory: Normal respiratory effort.  No retractions. Lungs CTAB. Abdominal: Soft and nontender. No distention. No guarding no rebound Back:  There is no focal tenderness or step off.  there is no midline tenderness there are no lesions noted. there is no CVA tenderness Musculoskeletal: No lower extremity tenderness, no upper extremity tenderness. No joint effusions, no DVT signs strong distal pulses no edema Neurologic:  Normal speech and language. No gross focal neurologic deficits are appreciated.  Skin:  Skin is warm, dry and intact. No rash noted. Psychiatric: Mood and affect are somewhat bizarre at baseline  ____________________________________________   LABS (all labs ordered are listed, but only abnormal results are displayed)  Labs Reviewed  CBC WITH DIFFERENTIAL/PLATELET - Abnormal; Notable for the following components:      Result Value   RBC 3.70 (*)     Hemoglobin 11.9 (*)    HCT 33.1 (*)    RDW 15.4 (*)    All other components within normal limits  BASIC METABOLIC PANEL - Abnormal; Notable for the following components:   Sodium 124 (*)    Chloride 92 (*)    CO2 20 (*)    Glucose, Bld 103 (*)    All other components within normal limits  URINALYSIS, COMPLETE (UACMP) WITH MICROSCOPIC - Abnormal; Notable for the following components:   Color, Urine STRAW (*)    APPearance CLEAR (*)    Specific Gravity, Urine 1.003 (*)    Hgb urine dipstick SMALL (*)    All other components within normal limits  VALPROIC ACID LEVEL - Abnormal; Notable for the following components:   Valproic Acid Lvl 135 (*)    All other components within normal limits  URINE DRUG SCREEN, QUALITATIVE ([facility] ONLY)  GLUCOSE, CAPILLARY    Pertinent labs  results that were available during my care of the patient were reviewed by me and considered in my medical decision making (see chart for details). ____________________________________________  EKG  I personally interpreted any EKGs ordered by me or triage Sinus rhythm rate 90 bpm no acute ST elevation depression normal axis unremarkable EKG ____________________________________________  RADIOLOGY  Pertinent labs & imaging results that were available during my care of the patient were reviewed by me and considered in my medical decision making (see chart for details). If possible, patient and/or family made aware of any abnormal findings.  No results found. ____________________________________________    PROCEDURES  Procedure(s) performed: None  Procedures  Critical Care performed: None  ____________________________________________   INITIAL IMPRESSION / ASSESSMENT AND PLAN / ED COURSE  Pertinent labs & imaging results that were available during my care of the patient were reviewed by me and considered in my medical decision making (see chart for details).  Patient here with a witnessed seizure  again hyponatremic, valproic acid is supratherapeutic, discussed with Dr. Thad Ranger of neurology.  Very much appreciate her input.  She did suggest that we admit the patient for further evaluation of his recurrent seizures.  We are repleting his sodium and his Keppra and we will admit.    ____________________________________________   FINAL CLINICAL IMPRESSION(S) / ED DIAGNOSES  Final diagnoses:  Seizure (HCC)      This chart  was dictated using voice recognition software.  Despite best efforts to proofread,  errors can occur which can change meaning.      Jeanmarie Plant, MD 07/24/18 1330

## 2018-07-24 NOTE — H&P (Signed)
[facility] [place] [facility]   PATIENT NAME: Oscar Duke    MR#:  [id number]  DATE OF BIRTH:  1971/04/17  DATE OF ADMISSION:  07/24/2018  PRIMARY CARE PHYSICIAN: Duffy RhodyPartridge, Tanillya, FNP   REQUESTING/REFERRING PHYSICIAN: dr Alphonzo Lemmingsmcshane  CHIEF COMPLAINT:   seizure HISTORY OF PRESENT ILLNESS:  Oscar ParadiseMark Duke  is a 47 y.o. male with a known history of seizure and SAD who presents to the ED via EMS from group home for "difficulty breathing and not being treated "right at the group home".  (Per medical records)  He was discharged a few [facility] with seizure and seen by neurology. He also has chronic hyponatremia. He had a witnessed grand mal seizure while in the ED waiting room this morning. ER MD spoke with Dr Thad Rangerreynolds who recommends patient come into the hospital for further titration of his anti epileptic medications.   Patient received 3 mg total of Ativan 1 mg for the grand mal seizure and 2 mg for combativeness and agitation post seizure and is now sedated.  PAST MEDICAL HISTORY:   Past Medical History:  Diagnosis Date  . Bipolar 1 disorder (HCC)   . Diabetes mellitus without complication (HCC)   . Hyperlipidemia   . Hypertension   . 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 Schizophrenia, acute (HCC)   . Seizures (HCC)     PAST SURGICAL HISTORY:   Past Surgical History:  Procedure Laterality Date  . JOINT REPLACEMENT      SOCIAL HISTORY:   Social History   Tobacco Use  . Smoking status: Never Smoker  . Smokeless tobacco: Never Used  Substance Use Topics  . Alcohol use: No    FAMILY HISTORY:  Unable to obtain from patient as he is sedated  DRUG ALLERGIES:   Allergies  Allergen Reactions  . Haldol [Haloperidol Lactate]   . Inderal [Propranolol]   . Januvia [Sitagliptin] Other (See Comments)    Skin discoloration on lower legs  . Lithium Other (See Comments)    Seizures    REVIEW OF SYSTEMS:   Unable to  obtain patient is sedated from Ativan  MEDICATIONS AT HOME:   Prior to Admission medications   Medication Sig Start Date End Date Taking? Authorizing Provider  acetaminophen (TYLENOL) 500 MG tablet Take 1 tablet (500 mg total) by mouth every 6 (six) hours as needed for mild pain, moderate pain, fever or headache. 05/28/18  Yes O'Neal, Fransico SettersSarita, MD  amLODipine (NORVASC) 5 MG tablet Take 1 tablet (5 mg total) by mouth daily. 05/28/18  Yes [name]'Neal, Sarita, MD  benztropine (COGENTIN) 1 MG tablet Take 1 tablet (1 mg total) by mouth 2 (two) times daily. 05/28/18  Yes [name]'Neal, Sarita, MD  Cholecalciferol (VITAMIN D3) 2000 units TABS Take 2,000 Units by mouth daily. 05/30/18  Yes O'Neal, Fransico SettersSarita, MD  clonazePAM (KLONOPIN) 1 MG tablet Take 1 tablet (1 mg total) by mouth 3 (three) times daily. 05/28/18  Yes [name]'Neal, Sarita, MD  divalproex (DEPAKOTE) 500 MG DR tablet Take 1 tablet (500 mg total) by mouth every 12 (twelve) hours. 07/21/18  Yes Shaune Pollackhen, Qing, MD  fluPHENAZine (PROLIXIN) 10 MG tablet Take 10 mg by mouth 3 (three) times daily. 07/16/18  Yes [provider]  ibuprofen (ADVIL,MOTRIN) 400 MG tablet Take 1 tablet (400 mg total) by mouth every 6 (six) hours as needed for fever, headache, mild pain, moderate pain or cramping. 05/28/18  Yes [name]'Neal, Sarita, MD  insulin detemir (LEVEMIR)  100 UNIT/ML injection Inject 0.28 mLs (28 Units total) into the skin daily. 05/30/18  Yes [name], MD  lactulose (CHRONULAC) 10 GM/15ML solution Take 15 mLs (10 g total) by mouth 2 (two) times daily. 05/28/18  Yes [name], MD  levETIRAcetam (KEPPRA) 1000 MG tablet Take 1 tablet (1,000 mg total) by mouth at bedtime. 05/28/18  Yes O'Neal, Fransico Setters, MD  lisinopril (PRINIVIL,ZESTRIL) 20 MG tablet Take 1 tablet (20 mg total) by mouth daily. 05/28/18  Yes O'Neal, Fransico Setters, MD  pantoprazole (PROTONIX) 40 MG tablet Take 1 tablet (40 mg total) by mouth daily. 05/28/18  Yes O'Neal, Fransico Setters, MD  sodium chloride 1 g tablet Take 1 tablet  (1 g total) by mouth daily. 05/28/18  Yes [name], MD  traZODone (DESYREL) 100 MG tablet Take 1 tablet (100 mg total) by mouth at bedtime. 05/28/18  Yes O'Neal, Fransico Setters, MD  insulin aspart (NOVOLOG) 100 UNIT/ML injection Inject 0-15 Units into the skin 3 (three) times daily with meals. CBG < 70: implement hypoglycemia protocol CBG 70 - 120: 0 units CBG 121 - 150: 2 units CBG 151 - 200: 3 units CBG 201 - 250: 5 units CBG 251 - 300: 8 units CBG 301 - 350: 11 units CBG 351 - 400: 15 units CBG > 400 call MD and obtain STAT lab verification Patient not taking: Reported on 07/09/2018 05/28/18   [name], MD  perphenazine (TRILAFON) 8 MG tablet Take 1 tablet (8 mg total) by mouth 3 (three) times daily. Patient not taking: Reported on 07/20/2018 05/28/18   [name], MD      VITAL SIGNS:  Blood pressure 108/81, pulse 95, temperature 99.1 F (37.3 C), temperature source Oral, resp. rate 18, height 6\' 6"  (1.981 m), weight 111 kg, SpO2 98 %.  PHYSICAL EXAMINATION:   Physical Exam  Constitutional: He appears well-developed and well-nourished. No distress.  HENT:  Head: Normocephalic.  Mouth/Throat: Oropharynx is clear and moist.  Eyes: No scleral icterus.  Sluggish pupils  Neck: Normal range of motion. Neck supple. No JVD present. No tracheal deviation present.  Cardiovascular: Normal rate, regular rhythm and normal heart sounds. Exam reveals no gallop and no friction rub.  No murmur heard. Pulmonary/Chest: Effort normal and breath sounds normal. No respiratory distress. He has no wheezes. He has no rales. He exhibits no tenderness.  Abdominal: Soft. Bowel sounds are normal. He exhibits no distension and no mass. There is no tenderness. There is no rebound and no guarding.  Musculoskeletal: He exhibits no edema, tenderness or deformity.  Neurological:  Sedated from Ativan  Skin: Skin is warm. No rash noted. No erythema.  Psychiatric:  Sedated from Ativan      LABORATORY  PANEL:   CBC Recent Labs  Lab 07/24/18 1129  WBC 8.2  HGB 11.9*  HCT 33.1*  PLT 169   ------------------------------------------------------------------------------------------------------------------  Chemistries  Recent Labs  Lab 07/24/18 1129  NA 124*  K 4.1  CL 92*  CO2 20*  GLUCOSE 103*  BUN 6  CREATININE 0.77  CALCIUM 9.3   ------------------------------------------------------------------------------------------------------------------  Cardiac Enzymes No results for input(s): TROPONINI in the last 168 hours. ------------------------------------------------------------------------------------------------------------------  RADIOLOGY:  No results found.  EKG:   none  IMPRESSION AND PLAN:   47 year old male with known seizure disorder, schizoaffective disorder and chronic hyponatremia who presents to the emergency room initially with complaints of shortness of breath however had a grand mal seizure.  1.  Grand mal seizure: Patient's Depakote level is elevated and therefore I will  discontinue this for now. Patient has been loaded with Keppra in the ER. Neurology to see patient today for further recommendation. Continue seizure precautions  2.  Acute on chronic hyponatremia: Nephrology consultation requested IV fluids started Follow sodium level  3.  Schizoaffective disorder: Continue outpatient regimen  4 Essential hypertension: Continue Norvasc and lisinopril  5.  Diabetes: Continue Levemir with sliding scale and ADA diet  Clinical social work consult for discharge planning  All the records are reviewed and case discussed with ED provider.   CODE STATUS: Full code  TOTAL TIME TAKING CARE OF THIS PATIENT: 42 minutes.    Aryiah Monterosso M.D on 07/24/2018 at 12:41 PM  Between 7am to 6pm - Pager - [phone]  After 6pm go to [email or web address] - Social research officer, government  [facility]  Office  [phone]  CC: Primary care physician;  Duffy Rhody, FNP

## 2018-07-24 NOTE — ED Notes (Addendum)
FIRST NURSE NOTE:  [facility], per EMS they were called out for difficulty breathing, however, when EMS arrived pt was walking around in no distress.  Pt states he is not being treated right at the care home. Per EMS, pt had recent seizure and has bruising to right side of the head. VSS with EMS

## 2018-07-24 NOTE — ED Triage Notes (Signed)
Pt denies pain or being sick. Pt stopped talking to RN while asking questions in triage. Pt looked off to R, began shaking. Seizure lasted about 45 seconds, post-ictal for about 2-3 minutes. Pt taken to room 4. Talking again once placed on stretcher. Dr. Alphonzo LemmingsMcShane and primary Rn at bedside.

## 2018-07-24 NOTE — Progress Notes (Signed)
   07/24/18 1410  Clinical Encounter Type  Visited With Patient  Visit Type Initial;Spiritual support  Referral From Nurse  Consult/Referral To Chaplain  Spiritual Encounters  Spiritual Needs Emotional   CH stopped by Mr. Oscar Duke's room after over-hearing the patient cursing very loudly at the NT who was in the room. I know the patient from his time in another unit. Mr. Oscar Duke recognized me but this did not stop him from yelling at me making threats towards me. I did not feel threatened by the patient because I have seen him go from anger to calm in seconds. He was calm when I left the room. I will follow up as needed.

## 2018-07-24 NOTE — ED Notes (Signed)
Pt wandering out of traige room 2 repeadetly. Joni ReiningNicole, EDT gets pt to walk back into triage 2 but pt continues to wander out of room multiple times.

## 2018-07-24 NOTE — Progress Notes (Signed)
[facility]  ROUNDING NOTE   Subjective:  Patient known to [place] from July 09, 2018. We saw him at that time for hyponatremia which was acute on chronic. 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 Patient states that he believes that he is drinking approximately 5 to 6 L of water per day. No way to confirm this at the moment. Serum sodium was 124 earlier today. Patient had a witnessed grand mal seizure in the emergency department this a.m. Neurology has been consulted and they have recommended further titration of his antiepileptic medications. He received 3 mg of Ativan earlier today.  Objective:  Vital signs in last 24 hours:  Temp:  [99.1 F (37.3 C)] 99.1 F (37.3 C) (09/12 1059) Pulse Rate:  [82-95] 83 (09/12 1344) Resp:  [18-20] 20 (09/12 1344) BP: (108-146)/(81-91) 146/91 (09/12 1344) SpO2:  [98 %-100 %] 100 % (09/12 1344) Weight:  [111 kg-111.1 kg] 111 kg (09/12 1125)  Weight change:  Filed Weights   07/24/18 1059 07/24/18 1125  Weight: 111.1 kg 111 kg    Intake/Output: No intake/output data recorded.   Intake/Output this shift:  Total I/O In: 1000 [IV Piggyback:1000] Out: -   Physical Exam: General: No acute distress  Head: Left sided facial bruising noted  Eyes: Left periorbital ecchymosis  Neck: Supple, trachea midline  Lungs:  Clear to auscultation, normal effort  Heart: S1S2 no rubs  Abdomen:  Soft, nontender, bowel sounds present  Extremities: no peripheral edema.  Neurologic: Awake, alert, following commands, slight agitation  Skin: Bruising left side of the face       Basic Metabolic Panel: Recent Labs  Lab 07/20/18 1755 07/21/18 0059 07/24/18 1129  NA 123* 129* 124*  K 3.8 3.8 4.1  CL 91* 95* 92*  CO2 24 26 20*  GLUCOSE 130* 149* 103*  BUN 8 8 6   CREATININE 0.76 0.80 0.77  CALCIUM 8.9 8.8* 9.3    Liver Function Tests: No results for input(s): AST, ALT, ALKPHOS, BILITOT, PROT, ALBUMIN in the last 168 hours. No  results for input(s): LIPASE, AMYLASE in the last 168 hours. No results for input(s): AMMONIA in the last 168 hours.  CBC: Recent Labs  Lab 07/20/18 1755 07/21/18 0059 07/24/18 1129  WBC 6.2 7.5 8.2  NEUTROABS  --   --  5.6  HGB 12.2* 12.0* 11.9*  HCT 33.2* 33.6* 33.1*  MCV 88.3 90.2 89.6  PLT 171 161 169    Cardiac Enzymes: No results for input(s): CKTOTAL, CKMB, CKMBINDEX, TROPONINI in the last 168 hours.  BNP: Invalid input(s): POCBNP  CBG: Recent Labs  Lab 07/20/18 2208 07/21/18 0737 07/21/18 1155 07/24/18 1127 07/24/18 1637  GLUCAP 129* 115* 141* 92 93    Microbiology: Results for orders placed or performed during the hospital encounter of 07/09/18  MRSA PCR Screening     Status: None   Collection Time: 07/10/18  4:39 AM  Result Value Ref Range Status   MRSA by PCR NEGATIVE NEGATIVE Final    Comment:        The GeneXpert MRSA Assay (FDA approved for NASAL specimens only), is one component of a comprehensive MRSA colonization surveillance program. It is not intended to diagnose MRSA infection nor to guide or monitor treatment for MRSA infections. Performed at [facility], [address] [place], [place] [address]     Coagulation Studies: No results for input(s): LABPROT, INR in the last 72 hours.  Urinalysis: Recent Labs    07/24/18 1115  COLORURINE  STRAW*  LABSPEC 1.003*  PHURINE 7.0  GLUCOSEU NEGATIVE  HGBUR SMALL*  BILIRUBINUR NEGATIVE  KETONESUR NEGATIVE  PROTEINUR NEGATIVE  NITRITE NEGATIVE  LEUKOCYTESUR NEGATIVE      Imaging: No results found.   Medications:   . sodium chloride    . levETIRAcetam     . [START ON 07/25/2018] amLODipine  5 mg Oral Daily  . benztropine  1 mg Oral BID  . [START ON 07/25/2018] cholecalciferol  2,000 Units Oral Daily  . clonazePAM  1 mg Oral TID  . enoxaparin (LOVENOX) injection  40 mg Subcutaneous Q24H  . fluPHENAZine  10 mg Oral TID  . insulin aspart  0-9 Units Subcutaneous TID  WC  . [START ON 07/25/2018] insulin detemir  28 Units Subcutaneous Daily  . lactulose  10 g Oral BID  . [START ON 07/25/2018] lisinopril  20 mg Oral Daily  . [START ON 07/25/2018] pantoprazole  40 mg Oral Daily  . [START ON 07/25/2018] sodium chloride  1 g Oral Daily  . traZODone  100 mg Oral QHS   HYDROcodone-acetaminophen, LORazepam, ondansetron **OR** ondansetron (ZOFRAN) IV, polyethylene glycol  Assessment/ Plan:  47 y.o. male with schizophrenia/bipolar disorder, hypertension, diabetes mellitus type II, seizure disorder, hyperlipidemia, who [facility] for seizure episode.   1.  Hyponatremia, suspecting psychogenic polydipsia.  2.  Seizure disorder.  3.  Hypertension.  4.  Diabetes mellitus type 2 without complication.   Plan:  We previously saw the patient for hyponatremia and even at that time suspected that there may be some element of psychogenic polydipsia.  When discussing with the patient today he believes that he is drinking 5 to 6 L of water per day.  His urine specific gravity would be in line with this as it is quite low at 1.003.  We will check serum osmolality, urine osmolality, TSH, cortisol, and uric acid.  At this point time we will place him on a 1 L fluid restriction.  Continue sodium chloride 1 g daily.  Further plan as patient progresses.  Thanks for consultation.   LOS: 0 Julina Altmann 9/12/20194:47 PM

## 2018-07-24 NOTE — ED Notes (Signed)
Annabelle Harmanana, RN given report.

## 2018-07-24 NOTE — Progress Notes (Signed)
PT Cancellation Note  Patient Details Name: Oscar Duke MRN: [id number] DOB: Jan 09, 1971   Cancelled Treatment:    Reason Eval/Treat Not Completed: Other (comment)(Patient recently demonstrating physical aggression toward staff and recent seizure activity.)  Order received.  Chart reviewed.  Per Nursing notes, pt has been combative and a security code recently called due to pt agitation.  Pt also has recently experienced seizure activity.  Will re-attempt later to give pt time to acclimate to surroundings and achieve a calmer state.   Glenetta HewSarah Kemo Spruce, PT, DPT 07/24/2018, 3:18 PM

## 2018-07-24 NOTE — ED Notes (Addendum)
Pt became agitated with staff and attempted to get out of bed. When attempting to redirect to lay down pt became physically aggressive attempting to hit staff. Male staff members were able to redirect staff back laying down into bed. Safety sitter present at bedside at this time.

## 2018-07-24 NOTE — Consult Note (Signed)
[facility] Face-to-Face Psychiatry Consult   Reason for Consult: Consult for this 47 year old man with chronic mental health problems who is back in the hospital with falls and probably seizures Referring Physician: Mody Patient Identification: Oscar Duke MRN:  [id number] Principal Diagnosis: Schizoaffective disorder, bipolar type [facility]) Diagnosis:   Patient Active Problem List   Diagnosis Date Noted  . 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 Seizure ([place]) [R56.9] 07/21/2018  . GERD (gastroesophageal reflux disease) [K21.9] 07/20/2018  . Increased ammonia level [R79.89] 07/09/2018  . Hyponatremia [E87.1] 05/28/2018  . Schizoaffective disorder, bipolar type ([place]) [F25.0] 05/14/2018  . Somnolence [R40.0] 10/05/2017  . Adverse drug interaction with prescription medication [T50.905A] 10/05/2017  . Hypertension [I10]   . Diabetes mellitus without complication ([place]) [W09.8]   . Bipolar 1 disorder ([place]) [F31.9]   . Seizures ([place]) [R56.9]   . Schizophrenia, acute ([place]) [F23] 05/14/2017    Total Time spent with patient: 1 hour  Subjective:   Oscar Duke is a 47 y.o. male patient admitted with "I fell down".  HPI: Patient seen chart reviewed.  Patient familiar from prior encounters.  This is a 47 year old man with chronic severe mental illness who also suffers from frequent falls which are probably at least in part related to seizures which are probably at least in part related to his chronic hyponatremia.  Patient currently has a lot of scrapes all over him and several bruises from having fallen down recently at home.  He does not really know whether he is been having seizures but knows that he is been falling.  As far as mental health issues he is denying suicidal or homicidal ideation.  Vaguely having hallucinations which he says he is able to ignore.  Patient is a little hyperverbal.  Constantly talking asking people questions somewhat intrusive but not threatening or violent.  He is  been trying to get up out of bed and pace around quite a bit so that he needs to have a sitter.  Social history: I think he has a guardian although I am not certain.  He lives in a group home.  He has been reasonably stable there. Medical history: Patient has chronic hyponatremia which I think is probably mostly related to his chronic water drinking.  He has hypertension diabetes history of seizures  Substance abuse history: Does not typically have active substance abuse issues  Past Psychiatric History: Long history of chronic mental health problems.  When he is agitated he can be pretty intimidating and loud and aggressive.  Typically does not have suicidal behavior so much as agitation.  Patient had long been maintained on Depakote and Klonopin and antipsychotics.  During a recent hospitalization he was found to have extremely elevated ammonia levels.  Depakote was discontinued and I left him off the Depakote and just on his antipsychotics when he went home.  Now he is a little more agitated although not necessarily completely manic.  We might want to consider restarting a mood stabilizer but I think for now we can just watch this.  Risk to Self:   Risk to Others:   Prior Inpatient Therapy:   Prior Outpatient Therapy:    Past Medical History:  Past Medical History:  Diagnosis Date  . Bipolar 1 disorder ([place])   . Diabetes mellitus without complication ([place])   . Hyperlipidemia   . Hypertension   . Manic affective disorder with recurrent episode ([place])   . Schizophrenia, acute ([place])   . Seizures ([place])  Past Surgical History:  Procedure Laterality Date  . JOINT REPLACEMENT     Family History: History reviewed. No pertinent family history. Family Psychiatric  History: Unknown Social History:  Social History   Substance and Sexual Activity  Alcohol Use No     Social History   Substance and Sexual Activity  Drug Use Yes  . Types: Marijuana   Comment: quit 5 years ago      Social History   Socioeconomic History  . Marital status: Single    Spouse name: Not on file  . Number of children: Not on file  . Years of education: Not on file  . Highest education level: Not on file  Occupational History  . Not on file  Social Needs  . Financial resource strain: Not on file  . Food insecurity:    Worry: Not on file    Inability: Not on file  . Transportation needs:    Medical: Not on file    Non-medical: Not on file  Tobacco Use  . Smoking status: Never Smoker  . Smokeless tobacco: Never Used  Substance and Sexual Activity  . Alcohol use: No  . Drug use: Yes    Types: Marijuana    Comment: quit 5 years ago  . Sexual activity: Not on file  Lifestyle  . Physical activity:    Days per week: Not on file    Minutes per session: Not on file  . Stress: Not on file  Relationships  . Social connections:    Talks on phone: Not on file    Gets together: Not on file    Attends religious service: Not on file    Active member of club or organization: Not on file    Attends meetings of clubs or organizations: Not on file    Relationship status: Not on file  Other Topics Concern  . Not on file  Social History Narrative  . Not on file   Additional Social History:    Allergies:   Allergies  Allergen Reactions  . Haldol [Haloperidol Lactate]   . Inderal [Propranolol]   . Januvia [Sitagliptin] Other (See Comments)    Skin discoloration on lower legs  . Lithium Other (See Comments)    Seizures    Labs:  Results for orders placed or performed during the hospital encounter of 07/24/18 (from the past 48 hour(s))  Urinalysis, Complete w Microscopic     Status: Abnormal   Collection Time: 07/24/18 11:15 AM  Result Value Ref Range   Color, Urine STRAW (A) YELLOW   APPearance CLEAR (A) CLEAR   Specific Gravity, Urine 1.003 (L) 1.005 - 1.030   pH 7.0 5.0 - 8.0   Glucose, UA NEGATIVE NEGATIVE mg/dL   Hgb urine dipstick SMALL (A) NEGATIVE   Bilirubin  Urine NEGATIVE NEGATIVE   Ketones, ur NEGATIVE NEGATIVE mg/dL   Protein, ur NEGATIVE NEGATIVE mg/dL   Nitrite NEGATIVE NEGATIVE   Leukocytes, UA NEGATIVE NEGATIVE   RBC / HPF 0-5 0 - 5 RBC/hpf   WBC, UA 0-5 0 - 5 WBC/hpf   Bacteria, UA NONE SEEN NONE SEEN   Squamous Epithelial / LPF NONE SEEN 0 - 5    Comment: Performed at [facility], [address], [place], [place] [address]  Urine Drug Screen, Qualitative     Status: None   Collection Time: 07/24/18 11:15 AM  Result Value Ref Range   Tricyclic, Ur Screen NONE DETECTED NONE DETECTED   Amphetamines, Ur Screen NONE  DETECTED NONE DETECTED   MDMA (Ecstasy)Ur Screen NONE DETECTED NONE DETECTED   Cocaine Metabolite,Ur [place] NONE DETECTED NONE DETECTED   Opiate, Ur Screen NONE DETECTED NONE DETECTED   Phencyclidine (PCP) Ur S NONE DETECTED NONE DETECTED   Cannabinoid 50 Ng, Ur [place] NONE DETECTED NONE DETECTED   Barbiturates, Ur Screen NONE DETECTED NONE DETECTED   Benzodiazepine, Ur Scrn NONE DETECTED NONE DETECTED   Methadone Scn, Ur NONE DETECTED NONE DETECTED    Comment: (NOTE) Tricyclics + metabolites, urine    Cutoff 1000 ng/mL Amphetamines + metabolites, urine  Cutoff 1000 ng/mL MDMA (Ecstasy), urine              Cutoff 500 ng/mL Cocaine Metabolite, urine          Cutoff 300 ng/mL Opiate + metabolites, urine        Cutoff 300 ng/mL Phencyclidine (PCP), urine         Cutoff 25 ng/mL Cannabinoid, urine                 Cutoff 50 ng/mL Barbiturates + metabolites, urine  Cutoff 200 ng/mL Benzodiazepine, urine              Cutoff 200 ng/mL Methadone, urine                   Cutoff 300 ng/mL The urine drug screen provides only a preliminary, unconfirmed analytical test result and should not be used for non-medical purposes. Clinical consideration and professional judgment should be applied to any positive drug screen result due to possible interfering substances. A more specific alternate chemical method must be used in order  to obtain a confirmed analytical result. Gas chromatography / mass spectrometry (GC/MS) is the preferred confirmat ory method. Performed at [facility], [address] [place], [place] [address]   Sodium, urine, random     Status: None   Collection Time: 07/24/18 11:15 AM  Result Value Ref Range   Sodium, Ur 38 mmol/L    Comment: Performed at [facility], [address] [place], [place] [address]  Glucose, capillary     Status: None   Collection Time: 07/24/18 11:27 AM  Result Value Ref Range   Glucose-Capillary 92 70 - 99 mg/dL  CBC with Differential     Status: Abnormal   Collection Time: 07/24/18 11:29 AM  Result Value Ref Range   WBC 8.2 3.8 - 10.6 K/uL   RBC 3.70 (L) 4.40 - 5.90 MIL/uL   Hemoglobin 11.9 (L) 13.0 - 18.0 g/dL   HCT 33.1 (L) 40.0 - 52.0 %   MCV 89.6 80.0 - 100.0 fL   MCH 32.3 26.0 - 34.0 pg   MCHC 36.0 32.0 - 36.0 g/dL   RDW 15.4 (H) 11.5 - 14.5 %   Platelets 169 150 - 440 K/uL   Neutrophils Relative % 68 %   Neutro Abs 5.6 1.4 - 6.5 K/uL   Lymphocytes Relative 22 %   Lymphs Abs 1.8 1.0 - 3.6 K/uL   Monocytes Relative 8 %   Monocytes Absolute 0.6 0.2 - 1.0 K/uL   Eosinophils Relative 1 %   Eosinophils Absolute 0.0 0 - 0.7 K/uL   Basophils Relative 1 %   Basophils Absolute 0.0 0 - 0.1 K/uL    Comment: Performed at [facility], [address], [place], [place] [address]  Basic metabolic panel     Status: Abnormal   Collection Time: 07/24/18 11:29 AM  Result Value Ref Range  Sodium 124 (L) 135 - 145 mmol/L   Potassium 4.1 3.5 - 5.1 mmol/L   Chloride 92 (L) 98 - 111 mmol/L   CO2 20 (L) 22 - 32 mmol/L   Glucose, Bld 103 (H) 70 - 99 mg/dL   BUN 6 6 - 20 mg/dL   Creatinine, Ser 0.77 0.61 - 1.24 mg/dL   Calcium 9.3 8.9 - 10.3 mg/dL   GFR calc non Af Amer >60 >60 mL/min   GFR calc Af Amer >60 >60 mL/min    Comment: (NOTE) The eGFR has been calculated using the CKD EPI equation. This calculation has not been validated  in all clinical situations. eGFR's persistently <60 mL/min signify possible Chronic Kidney Disease.    Anion gap 12 5 - 15    Comment: Performed at [facility], [address] [place], [place] [address]  Valproic acid level     Status: Abnormal   Collection Time: 07/24/18 11:29 AM  Result Value Ref Range   Valproic Acid Lvl 135 (H) 50.0 - 100.0 ug/mL    Comment: Performed at [facility], [address] [place], [place] [address]  Glucose, capillary     Status: None   Collection Time: 07/24/18  4:37 PM  Result Value Ref Range   Glucose-Capillary 93 70 - 99 mg/dL    Current Facility-Administered Medications  Medication Dose Route Frequency Provider Last Rate Last Dose  . 0.9 %  sodium chloride infusion   Intravenous Continuous Bettey Costa, MD      . Derrill Memo ON 07/25/2018] amLODipine (NORVASC) tablet 5 mg  5 mg Oral Daily Mody, Sital, MD      . benztropine (COGENTIN) tablet 1 mg  1 mg Oral BID Bettey Costa, MD      . Derrill Memo ON 07/25/2018] cholecalciferol (VITAMIN D) tablet 2,000 Units  2,000 Units Oral Daily Mody, Sital, MD      . clonazePAM (KLONOPIN) tablet 1 mg  1 mg Oral QID Alayssa Flinchum T, MD      . enoxaparin (LOVENOX) injection 40 mg  40 mg Subcutaneous Q24H Mody, Sital, MD      . fluPHENAZine (PROLIXIN) tablet 10 mg  10 mg Oral TID Bettey Costa, MD   10 mg at 07/24/18 1610  . HYDROcodone-acetaminophen (NORCO/VICODIN) 5-325 MG per tablet 1-2 tablet  1-2 tablet Oral Q4H PRN Mody, Sital, MD      . insulin aspart (novoLOG) injection 0-9 Units  0-9 Units Subcutaneous TID WC Bettey Costa, MD      . Derrill Memo ON 07/25/2018] insulin detemir (LEVEMIR) injection 28 Units  28 Units Subcutaneous Daily Mody, Sital, MD      . lactulose (CHRONULAC) 10 GM/15ML solution 10 g  10 g Oral BID Mody, Sital, MD      . levETIRAcetam (KEPPRA) IVPB 1000 mg/100 mL premix  1,000 mg Intravenous Once Schuyler Amor, MD      . Derrill Memo ON 07/25/2018] lisinopril (PRINIVIL,ZESTRIL) tablet 20 mg  20  mg Oral Daily Mody, Sital, MD      . LORazepam (ATIVAN) injection 1 mg  1 mg Intravenous Q4H PRN Bettey Costa, MD   1 mg at 07/24/18 1431  . ondansetron (ZOFRAN) tablet 4 mg  4 mg Oral Q6H PRN Bettey Costa, MD       Or  . ondansetron (ZOFRAN) injection 4 mg  4 mg Intravenous Q6H PRN Bettey Costa, MD      . Derrill Memo ON 07/25/2018] pantoprazole (PROTONIX) EC tablet 40 mg  40 mg Oral  Daily Mody, Sital, MD      . perphenazine (TRILAFON) tablet 8 mg  8 mg Oral TID Narelle Schoening T, MD      . polyethylene glycol (MIRALAX / GLYCOLAX) packet 17 g  17 g Oral Daily PRN Bettey Costa, MD      . Derrill Memo ON 07/25/2018] sodium chloride tablet 1 g  1 g Oral Daily Mody, Sital, MD      . traZODone (DESYREL) tablet 100 mg  100 mg Oral QHS Bettey Costa, MD        Musculoskeletal: Strength & Muscle Tone: within normal limits Gait & Station: normal Patient leans: N/A  Psychiatric Specialty Exam: Physical Exam  Nursing note and vitals reviewed. Constitutional: He appears well-developed and well-nourished.  HENT:  Head: Normocephalic.  Patient has a black eye on the right side and a lot of scrapes and bruises all over from his recent falls  Eyes: Pupils are equal, round, and reactive to light. Conjunctivae are normal.  Neck: Normal range of motion.  Cardiovascular: Regular rhythm and normal heart sounds.  Respiratory: Effort normal.  GI: Soft.  Musculoskeletal: Normal range of motion.  Neurological: He is alert.  Skin: Skin is warm and dry.     Psychiatric: His affect is blunt. His speech is tangential. He is slowed. Thought content is not paranoid. Cognition and memory are impaired. He expresses impulsivity. He expresses no homicidal and no suicidal ideation.    Review of Systems  Constitutional: Negative.   HENT: Negative.   Eyes: Negative.   Respiratory: Negative.   Cardiovascular: Negative.   Gastrointestinal: Negative.   Musculoskeletal: Positive for falls.  Skin: Negative.   Neurological: Negative.    Psychiatric/Behavioral: Negative for depression, hallucinations, memory loss, substance abuse and suicidal ideas. The patient is not nervous/anxious and does not have insomnia.     Blood pressure 116/82, pulse 93, temperature 98.4 F (36.9 C), temperature source Oral, resp. rate 18, height '6\' 6"'$  (1.981 m), weight 111 kg, SpO2 99 %.Body mass index is 28.28 kg/m.  General Appearance: Disheveled  Eye Contact:  Fair  Speech:  Pressured  Volume:  Increased  Mood:  Euthymic  Affect:  Congruent  Thought Process:  Disorganized  Orientation:  Full (Time, Place, and Person)  Thought Content:  Rumination and Tangential  Suicidal Thoughts:  No  Homicidal Thoughts:  No  Memory:  Immediate;   Fair Recent;   Fair Remote;   Fair  Judgement:  Impaired  Insight:  Shallow  Psychomotor Activity:  Decreased  Concentration:  Concentration: Poor  Recall:  [employer] of Knowledge:  Fair  Language:  Fair  Akathisia:  No  Handed:  Right  AIMS (if indicated):     Assets:  Communication Skills Desire for Improvement Housing Resilience  ADL's:  Intact  Cognition:  Impaired,  Mild  Sleep:        Treatment Plan Summary: Daily contact with patient to assess and evaluate symptoms and progress in treatment, Medication management and Plan Patient was schizoaffective disorder.  Also seizure disorder hyponatremia probably excessive water intake.  Patient is a little bit hyperverbal and a little on the manic side although that is pretty chronic for him.  He was not agitated or threatening.  For now I think we can forego restarting any mood stabilizer instead I am putting him back on the Trilafon that he was on previously in addition to his Prolixin.  I am also going to increase his Klonopin to 1 mg 4 times  a day.  I might suggest that the patient be put on fluid restriction if it is at all possible.  I will continue to follow up while he is in the hospital.  Disposition: Patient does not meet criteria for  psychiatric inpatient admission. Supportive therapy provided about ongoing stressors.  Alethia Berthold, MD 07/24/2018 7:57 PM

## 2018-07-24 NOTE — Consult Note (Addendum)
Reason for Consult: Seizures Referring Physician: Adrian Saran, MD  CC: Seizure like activity  HPI: Oscar Duke is an 47 y.o. male with history of bipolar disorder, diabetes mellitus, hyperlipidemia, hypertension, schizophrenia, and seizure disorder presenting to the ED from [facility] with seizure like activity.  Per EMS reports, EMS was called for difficulty breathing, however when EMS arrived he was noted to be in no distress.  Caregiver who was available over the phone stated that patient did not have a seizure and was insisting  that he wanted to come to the ED.  However patient was noted to have an episode of seizure-like activity.  Per nursing triage report, patient was talking when he suddenly looked off to the right and began shaking.  Episode lasted about 45 seconds, postictal for about 2 to 3 minutes.  He was loaded with the thousand milligrams of Keppra IV.  He was recently seen in the ED and admitted with similar episode causing falls and laceration to his right forehead.  He was discharged home on Keppra 1000 mg twice daily and Depakote 500 mg twice daily.  Per medication administration records from [facility], patient is on Keppra 1000 mg twice a day last dose administered at 8 am and Depakote 500 mg twice a day last dose administered at 8 am.  Is noted to have elevated Depakote levels of 135 ug/ml.  Patient is currently agitated with staff and attempted to get out of bed.  When attempting to redirect patient becomes physically aggressive attempting to hit staff is currently having a sitter present at bedside.  Patient states that he does not want to go back to [facility] because he thinks they are not treating him right.    Past Medical History:  Diagnosis Date  . Bipolar 1 disorder (HCC)   . Diabetes mellitus without complication (HCC)   . Hyperlipidemia   . Hypertension   . 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  Schizophrenia, acute (HCC)   . Seizures (HCC)     Past Surgical History:  Procedure Laterality Date  . JOINT REPLACEMENT      History reviewed. No pertinent family history.  Social History:  reports that he has never smoked. He has never used smokeless tobacco. He reports that he has current or past drug history. Drug: Marijuana. He reports that he does not drink alcohol.  Allergies  Allergen Reactions  . Haldol [Haloperidol Lactate]   . Inderal [Propranolol]   . Januvia [Sitagliptin] Other (See Comments)    Skin discoloration on lower legs  . Lithium Other (See Comments)    Seizures    Medications: I have reviewed the patient's current medications. Prior to Admission:  (Not in a hospital admission) Scheduled:   ROS: General ROS: negative for - chills, fatigue, fever, night sweats, weight gain or weight loss Psychological ROS: Positive for - behavioral disorder, hallucinations, memory difficulties, mood swings. Negative for  suicidal ideation Ophthalmic ROS: negative for - blurry vision, double vision, eye pain or loss of vision ENT ROS: negative for - epistaxis, nasal discharge, oral lesions, sore throat, tinnitus or vertigo Allergy and Immunology ROS: negative for - hives or itchy/watery eyes Hematological and Lymphatic ROS: negative for - bleeding problems, bruising or swollen lymph nodes Endocrine ROS: negative for - galactorrhea, hair pattern changes, polydipsia/polyuria or temperature intolerance Respiratory ROS: negative for - cough, hemoptysis, shortness of breath or wheezing Cardiovascular ROS: negative for - chest  pain, dyspnea on exertion, edema or irregular heartbeat Gastrointestinal ROS: negative for - abdominal pain, diarrhea, hematemesis, nausea/vomiting or stool incontinence Genito-Urinary ROS: negative for - dysuria, hematuria, incontinence or urinary frequency/urgency Musculoskeletal ROS: negative for - joint swelling or muscular weakness Neurological ROS:  as noted in HPI Dermatological ROS: negative for rash and skin lesion changes. Positive for laceration to right forehead  Physical Exam   Vitals Blood pressure 132/85, pulse 82, temperature 99.1 F (37.3 C), temperature source Oral, resp. rate 18, height 6\' 6"  (1.981 m), weight 111 kg, SpO2 98 %.  Neurological Exam   Mental Status: Alert, oriented, thought content inappropriate. Requires re-direction, very agitated. Speech fluent without evidence of aphasia. Able to follow 2 step commands without difficulty. Attention span and concentration seemed appropriate   Cranial Nerves: II: Discs flat bilaterally; Visual fields grossly normal, pupils equal, round, reactive to light and accommodation III,IV, VI: ptosis not present, extra-ocular motions intact bilaterally V,VII: smile symmetric, facial light touch sensation intact VIII: hearing normal bilaterally IX,X: gag reflex present XI: bilateral shoulder shrug XII: midline tongue extension  Motor: Right :  Upper extremity   5-/5 Without pronator drift      Left: Upper extremity   5/5 without pronator drift Right:   Lower extremity   2/5                                          Left: Lower extremity   5/5 Tone and bulk:normal tone throughout; no atrophy noted  Sensory: Pinprick and light touch decreased on the right Deep Tendon Reflexes: 2+ and symmetric throughout Plantars: Right: mute                              Left: mute Cerebellar: Finger-to-nose testing intact bilaterally. Heel to shin testing normal bilaterally Gait: not tested due to safety concerns  Laboratory Studies:   Basic Metabolic Panel: Recent Labs  Lab 07/20/18 1755 07/21/18 0059 07/24/18 1129  NA 123* 129* 124*  K 3.8 3.8 4.1  CL 91* 95* 92*  CO2 24 26 20*  GLUCOSE 130* 149* 103*  BUN 8 8 6   CREATININE 0.76 0.80 0.77  CALCIUM 8.9 8.8* 9.3    Liver Function Tests: No results for input(s): AST, ALT, ALKPHOS, BILITOT, PROT, ALBUMIN in the last 168  hours. No results for input(s): LIPASE, AMYLASE in the last 168 hours. No results for input(s): AMMONIA in the last 168 hours.  CBC: Recent Labs  Lab 07/20/18 1755 07/21/18 0059 07/24/18 1129  WBC 6.2 7.5 8.2  NEUTROABS  --   --  5.6  HGB 12.2* 12.0* 11.9*  HCT 33.2* 33.6* 33.1*  MCV 88.3 90.2 89.6  PLT 171 161 169    Cardiac Enzymes: No results for input(s): CKTOTAL, CKMB, CKMBINDEX, TROPONINI in the last 168 hours.  BNP: Invalid input(s): POCBNP  CBG: Recent Labs  Lab 07/20/18 2208 07/21/18 0737 07/21/18 1155 07/24/18 1127  GLUCAP 129* 115* 141* 92    Microbiology: Results for orders placed or performed during the hospital encounter of 07/09/18  MRSA PCR Screening     Status: None   Collection Time: 07/10/18  4:39 AM  Result Value Ref Range Status   MRSA by PCR NEGATIVE NEGATIVE Final    Comment:        The GeneXpert MRSA Assay (FDA approved  for NASAL specimens only), is one component of a comprehensive MRSA colonization surveillance program. It is not intended to diagnose MRSA infection nor to guide or monitor treatment for MRSA infections. Performed at [facility], [address] [place], [place] [address]     Coagulation Studies: No results for input(s): LABPROT, INR in the last 72 hours.  Urinalysis:  Recent Labs  Lab 07/20/18 1818 07/24/18 1115  COLORURINE YELLOW* STRAW*  LABSPEC 1.009 1.003*  PHURINE 6.0 7.0  GLUCOSEU NEGATIVE NEGATIVE  HGBUR SMALL* SMALL*  BILIRUBINUR NEGATIVE NEGATIVE  KETONESUR 5* NEGATIVE  PROTEINUR NEGATIVE NEGATIVE  NITRITE NEGATIVE NEGATIVE  LEUKOCYTESUR NEGATIVE NEGATIVE    Lipid Panel:     Component Value Date/Time   CHOL 93 05/15/2018 0625   TRIG 61 05/15/2018 0625   HDL 45 05/15/2018 0625   CHOLHDL 2.1 05/15/2018 0625   VLDL 12 05/15/2018 0625   LDLCALC 36 05/15/2018 0625    HgbA1C:  Lab Results  Component Value Date   HGBA1C 5.6 05/15/2018    Urine Drug Screen:      Component  Value Date/Time   LABOPIA NONE DETECTED 07/24/2018 1115   LABOPIA NONE DETECTED 10/13/2017 1311   COCAINSCRNUR NONE DETECTED 07/24/2018 1115   LABBENZ NONE DETECTED 07/24/2018 1115   LABBENZ POSITIVE (A) 10/13/2017 1311   AMPHETMU NONE DETECTED 07/24/2018 1115   AMPHETMU NONE DETECTED 10/13/2017 1311   THCU NONE DETECTED 07/24/2018 1115   THCU NONE DETECTED 10/13/2017 1311   LABBARB NONE DETECTED 07/24/2018 1115   LABBARB NONE DETECTED 10/13/2017 1311    Alcohol Level:  Recent Labs  Lab 07/20/18 1755  ETH <10    Other results: EKG: normal EKG, normal sinus rhythm, unchanged from previous tracings.  Imaging: No results found.   Patient seen and examined.  Clinical course and management discussed.  Necessary edits performed.  I agree with the above.  Assessment and plan of care developed and discussed below.    Assessment: 46 y.o.malewith history of seizure and mood disorder recently seen for seizure with a fall causing injury to right forehead and eye. He presents today again with seizure like activity and increased agitation. He was discharged on Keppra 1000 mg once a day and Depakote 500 mg BID which he is taking per facility MAR. He was loaded with Keppra 1000 mg IV in the ED.  Depakote level of 135 .     Plan 1. Decrease Depakote to 250 mg DR two times a day 2. Increase Keppra XR to 1250 mg nightly.  Would start on 9/13 since patient has had IV dosing and po dosing today 3. Seizure precautions 4. Ativan prn seizure activity 5. Depakote level in AM 6. Agree with addressing hyponatremia  This patient was staffed with Dr. Verlon Au, Thad Ranger who personally evaluated patient, reviewed documentation and agreed with assessment and plan of care as above.  Webb Silversmith, DNP, FNP-BC Board certified Nurse Practitioner Neurology Department  07/24/2018, 12:37 PM    Thana Farr, MD Neurology [phone]  07/24/2018  6:05 PM

## 2018-07-25 LAB — BASIC METABOLIC PANEL
Anion gap: 11 (ref 5–15)
BUN: 12 mg/dL (ref 6–20)
CALCIUM: 8.9 mg/dL (ref 8.9–10.3)
CO2: 21 mmol/L — ABNORMAL LOW (ref 22–32)
CREATININE: 0.74 mg/dL (ref 0.61–1.24)
Chloride: 100 mmol/L (ref 98–111)
GFR calc Af Amer: >60 mL/min (ref >60)
Glucose, Bld: 150 mg/dL — ABNORMAL HIGH (ref 70–99)
POTASSIUM: 3.5 mmol/L (ref 3.5–5.1)
SODIUM: 132 mmol/L — AB (ref 135–145)

## 2018-07-25 LAB — GLUCOSE, CAPILLARY
GLUCOSE-CAPILLARY: 156 mg/dL — AB (ref 70–99)
GLUCOSE-CAPILLARY: 140 mg/dL — AB (ref 70–99)
GLUCOSE-CAPILLARY: 143 mg/dL — AB (ref 70–99)
Glucose-Capillary: 120 mg/dL — ABNORMAL HIGH (ref 70–99)
Glucose-Capillary: 154 mg/dL — ABNORMAL HIGH (ref 70–99)

## 2018-07-25 LAB — CBC
HCT: 31.4 % — ABNORMAL LOW (ref 40.0–52.0)
HEMOGLOBIN: 11.4 g/dL — AB (ref 13.0–18.0)
MCH: 32.6 pg (ref 26.0–34.0)
MCHC: 36.1 g/dL — AB (ref 32.0–36.0)
MCV: 90.3 fL (ref 80.0–100.0)
Platelets: 143 10*3/uL — ABNORMAL LOW (ref 150–440)
RBC: 3.48 MIL/uL — ABNORMAL LOW (ref 4.40–5.90)
RDW: 15.6 % — ABNORMAL HIGH (ref 11.5–14.5)
WBC: 7.1 10*3/uL (ref 3.8–10.6)

## 2018-07-25 LAB — VALPROIC ACID LEVEL: Valproic Acid Lvl: 54 ug/mL (ref 50.0–100.0)

## 2018-07-25 LAB — OSMOLALITY: Osmolality: 268 mOsm/kg — ABNORMAL LOW (ref 275–295)

## 2018-07-25 LAB — CORTISOL: CORTISOL PLASMA: 7.8 ug/dL

## 2018-07-25 LAB — OSMOLALITY, URINE: Osmolality, Ur: 147 mOsm/kg — ABNORMAL LOW (ref 300–900)

## 2018-07-25 MED ORDER — POTASSIUM CHLORIDE CRYS ER 20 MEQ PO TBCR
20.0000 meq | EXTENDED_RELEASE_TABLET | Freq: Once | ORAL | Status: AC
  Administered 2018-07-25: 12:00:00 20 meq via ORAL
  Filled 2018-07-25: qty 1

## 2018-07-25 MED ORDER — LEVETIRACETAM ER 500 MG PO TB24
1500.0000 mg | ORAL_TABLET | Freq: Every day | ORAL | Status: DC
  Administered 2018-07-25 – 2018-07-26 (×2): 1500 mg via ORAL
  Filled 2018-07-25 (×2): qty 3

## 2018-07-25 NOTE — Clinical Social Work Note (Signed)
Clinical Social Work Assessment  Patient Details  Name: Oscar Duke MRN: [id number] Date of Birth: 1971-10-07  Date of referral:  07/25/18               Reason for consult:  Facility Placement                Permission sought to share information with:  Case Manager, Customer service manager, Family Supports Permission granted to share information::  Yes, Verbal Permission Granted  Name::        Agency::     Relationship::     Contact Information:     Housing/Transportation Living arrangements for the past 2 months:  Group Home Source of Information:  Patient Patient Interpreter Needed:  None Criminal Activity/Legal Involvement Pertinent to Current Situation/Hospitalization:  No - Comment as needed Significant Relationships:  Other Family Members Lives with:  Facility Resident Do you feel safe going back to the place where you live?  Yes Need for family participation in patient care:  Yes (Comment)  Care giving concerns:  Patient lives at [facility]    Social Worker assessment / plan:  CSW consulted for facility placement. CSW met with patient to discuss discharge plan. CSW introduced self and explained role. Patient states that he is from [facility]. Patient states that he has seizures and has to come to the hospital a lot. CSW asked patient about returning to group home and patient is agreeable to return to group home. CSW also spoke with patient's guardian Oscar Duke [phone]. Oscar Duke also in agreement with discharge back to group home. CSW will follow for discharge planning.   Employment status:  Disabled (Comment on whether or not currently receiving Disability) Insurance information:  Medicare PT Recommendations:  Not assessed at this time Information / Referral to community resources:     Patient/Family's Response to care:  Patient thanked CSW for assistance   Patient/Family's Understanding of and Emotional Response to Diagnosis,  Current Treatment, and Prognosis:  Patient and guardian in agreement with discharge plan   Emotional Assessment Appearance:  Appears stated age Attitude/Demeanor/Rapport:  Lethargic Affect (typically observed):  Flat, Guarded Orientation:  Oriented to Self, Oriented to Place Alcohol / Substance use:  Not Applicable Psych involvement (Current and /or in the community):  No (Comment)  Discharge Needs  Concerns to be addressed:  Discharge Planning Concerns Readmission within the last 30 days:  Yes Current discharge risk:  None Barriers to Discharge:  Continued Medical Work up   [employer], [place] 07/25/2018, 9:00 AM

## 2018-07-25 NOTE — Progress Notes (Signed)
[facility]  ROUNDING NOTE   Subjective:  Patient seen at bedside. Serum sodium up [place] 132 this a.m. Urine output was 3.1 L. Urine osmole's were quite low at 147 supporting the diagnosis of psychogenic polydipsia.  Objective:  Vital signs in last 24 hours:  Temp:  [98.4 F (36.9 C)-99.1 F (37.3 C)] 98.4 F (36.9 C) (09/12 1945) Pulse Rate:  [82-95] 87 (09/13 0842) Resp:  [18-21] 21 (09/13 0455) BP: (108-146)/(81-91) 131/84 (09/13 0842) SpO2:  [97 %-100 %] 97 % (09/13 0455) Weight:  [111 kg-111.1 kg] 111 kg (09/12 1125)  Weight change:  Filed Weights   07/24/18 1059 07/24/18 1125  Weight: 111.1 kg 111 kg    Intake/Output: I/O last 3 completed shifts: In: 1000 [IV Piggyback:1000] Out: 3195 [Urine:3195]   Intake/Output this shift:  No intake/output data recorded.  Physical Exam: General: No acute distress  Head: Left sided facial bruising noted  Eyes: Left periorbital ecchymosis  Neck: Supple, trachea midline  Lungs:  Clear to auscultation, normal effort  Heart: S1S2 no rubs  Abdomen:  Soft, nontender, bowel sounds present  Extremities: no peripheral edema.  Neurologic: Awake, alert, following commands, slight agitation  Skin: Bruising left side of the face       Basic Metabolic Panel: Recent Labs  Lab 07/20/18 1755 07/21/18 0059 07/24/18 1129 07/25/18 0538  NA 123* 129* 124* 132*  K 3.8 3.8 4.1 3.5  CL 91* 95* 92* 100  CO2 24 26 20* 21*  GLUCOSE 130* 149* 103* 150*  BUN 8 8 6 12   CREATININE 0.76 0.80 0.77 0.74  CALCIUM 8.9 8.8* 9.3 8.9    Liver Function Tests: No results for input(s): AST, ALT, ALKPHOS, BILITOT, PROT, ALBUMIN in the last 168 hours. No results for input(s): LIPASE, AMYLASE in the last 168 hours. No results for input(s): AMMONIA in the last 168 hours.  CBC: Recent Labs  Lab 07/20/18 1755 07/21/18 0059 07/24/18 1129 07/25/18 0538  WBC 6.2 7.5 8.2 7.1  NEUTROABS  --   --  5.6  --   HGB 12.2* 12.0* 11.9* 11.4*   HCT 33.2* 33.6* 33.1* 31.4*  MCV 88.3 90.2 89.6 90.3  PLT 171 161 169 143*    Cardiac Enzymes: No results for input(s): CKTOTAL, CKMB, CKMBINDEX, TROPONINI in the last 168 hours.  BNP: Invalid input(s): POCBNP  CBG: Recent Labs  Lab 07/21/18 1155 07/24/18 1127 07/24/18 1637 07/25/18 0557 07/25/18 0737  GLUCAP 141* 92 93 156* 140*    Microbiology: Results for orders placed or performed during the hospital encounter of 07/09/18  MRSA PCR Screening     Status: None   Collection Time: 07/10/18  4:39 AM  Result Value Ref Range Status   MRSA by PCR NEGATIVE NEGATIVE Final    Comment:        The GeneXpert MRSA Assay (FDA approved for NASAL specimens only), is one component of a comprehensive MRSA colonization surveillance program. It is not intended to diagnose MRSA infection nor to guide or monitor treatment for MRSA infections. Performed at [facility], [address] [place], [place] [id number]     Coagulation Studies: No results for input(s): LABPROT, INR in the last 72 hours.  Urinalysis: Recent Labs    07/24/18 1115  COLORURINE STRAW*  LABSPEC 1.003*  PHURINE 7.0  GLUCOSEU NEGATIVE  HGBUR SMALL*  BILIRUBINUR NEGATIVE  KETONESUR NEGATIVE  PROTEINUR NEGATIVE  NITRITE NEGATIVE  LEUKOCYTESUR NEGATIVE      Imaging: No results found.   Medications:   .  sodium chloride 75 mL/hr at 07/25/18 0606  . levETIRAcetam     . amLODipine  5 mg Oral Daily  . benztropine  1 mg Oral BID  . cholecalciferol  2,000 Units Oral Daily  . clonazePAM  1 mg Oral QID  . enoxaparin (LOVENOX) injection  40 mg Subcutaneous Q24H  . fluPHENAZine  10 mg Oral TID  . insulin aspart  0-9 Units Subcutaneous TID WC  . insulin detemir  28 Units Subcutaneous Daily  . lactulose  10 g Oral BID  . lisinopril  20 mg Oral Daily  . pantoprazole  40 mg Oral Daily  . perphenazine  8 mg Oral TID  . potassium chloride  20 mEq Oral Once  . sodium chloride  1 g Oral Daily  .  traZODone  100 mg Oral QHS   HYDROcodone-acetaminophen, LORazepam, ondansetron **OR** ondansetron (ZOFRAN) IV, polyethylene glycol  Assessment/ Plan:  47 y.o. male with schizophrenia/bipolar disorder, hypertension, diabetes mellitus type II, seizure disorder, hyperlipidemia, who was admitted to [facility] on 07/24/18 for seizure episode.   1.  Hyponatremia, suspecting psychogenic polydipsia.  2.  Seizure disorder.  3.  Hypertension.  4.  Diabetes mellitus type 2 without complication.   Plan:  Serum sodium has improved and is currently 132.  We will go ahead and discontinue 0.9 normal saline.  Urine osmolality was quite low at 147 supporting the diagnosis of psychogenic polydipsia.  Continue fluid restriction, however we will liberalize fluid intake to 1500 cc total per day.  Otherwise adjustment of his antiseizure medications per neurology.  Further plan as patient progresses.   [name] 9/13/20199:19 AM

## 2018-07-25 NOTE — Consult Note (Signed)
[place] [facility] Consult   Reason for Consult: Follow-up consult patient was schizoaffective disorder Referring Physician: Mody Patient Identification: Oscar Duke MRN:  [id number] Principal Diagnosis: Schizoaffective disorder, bipolar type [facility]) Diagnosis:   Patient Active Problem List   Diagnosis Date Noted  . 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 Seizure ([place]) [R56.9] 07/21/2018  . GERD (gastroesophageal reflux disease) [K21.9] 07/20/2018  . Increased ammonia level [R79.89] 07/09/2018  . Hyponatremia [E87.1] 05/28/2018  . Schizoaffective disorder, bipolar type ([place]) [F25.0] 05/14/2018  . Somnolence [R40.0] 10/05/2017  . Adverse drug interaction with prescription medication [T50.905A] 10/05/2017  . Hypertension [I10]   . Diabetes mellitus without complication ([place]) [Q22.2]   . Bipolar 1 disorder ([place]) [F31.9]   . Seizures ([place]) [R56.9]   . Schizophrenia, acute ([place]) [F23] 05/14/2017    Total Time spent with patient: 20 minutes  Subjective:   Oscar Duke is a 47 y.o. male patient admitted with "I guess I am okay".  HPI: See previous note.  Patient was schizoaffective disorder and a history of water intoxication comes into the hospital with seizures and falls.  Patient was put back on some more antipsychotic medicine and increase the dose of his benzodiazepines yesterday.  He is less agitated today.  Less signs of mania.  Calmer and more cooperative.  Past Psychiatric History: Long history of chronic mental illness and potentially dangerous behavior.  Risk to Self:   Risk to Others:   Prior Inpatient Therapy:   Prior Outpatient Therapy:    Past Medical History:  Past Medical History:  Diagnosis Date  . Bipolar 1 disorder ([place])   . Diabetes mellitus without complication ([place])   . Hyperlipidemia   . Hypertension   . Manic affective disorder with recurrent episode ([place])   . Schizophrenia, acute ([place])   . Seizures ([place])     Past Surgical  History:  Procedure Laterality Date  . JOINT REPLACEMENT     Family History: History reviewed. No pertinent family history. Family Psychiatric  History: Previous notes Social History:  Social History   Substance and Sexual Activity  Alcohol Use No     Social History   Substance and Sexual Activity  Drug Use Yes  . Types: Marijuana   Comment: quit 5 years ago    Social History   Socioeconomic History  . Marital status: Single    Spouse name: Not on file  . Number of children: Not on file  . Years of education: Not on file  . Highest education level: Not on file  Occupational History  . Not on file  Social Needs  . Financial resource strain: Not on file  . Food insecurity:    Worry: Not on file    Inability: Not on file  . Transportation needs:    Medical: Not on file    Non-medical: Not on file  Tobacco Use  . Smoking status: Never Smoker  . Smokeless tobacco: Never Used  Substance and Sexual Activity  . Alcohol use: No  . Drug use: Yes    Types: Marijuana    Comment: quit 5 years ago  . Sexual activity: Not on file  Lifestyle  . Physical activity:    Days per week: Not on file    Minutes per session: Not on file  . Stress: Not on file  Relationships  . Social connections:    Talks on phone: Not on file    Gets together: Not on file    Attends religious service:  Not on file    Active member of club or organization: Not on file    Attends meetings of clubs or organizations: Not on file    Relationship status: Not on file  Other Topics Concern  . Not on file  Social History Narrative  . Not on file   Additional Social History:    Allergies:   Allergies  Allergen Reactions  . Haldol [Haloperidol Lactate]   . Inderal [Propranolol]   . Januvia [Sitagliptin] Other (See Comments)    Skin discoloration on lower legs  . Lithium Other (See Comments)    Seizures    Labs:  Results for orders placed or performed during the hospital encounter of  07/24/18 (from the past 48 hour(s))  Urinalysis, Complete w Microscopic     Status: Abnormal   Collection Time: 07/24/18 11:15 AM  Result Value Ref Range   Color, Urine STRAW (A) YELLOW   APPearance CLEAR (A) CLEAR   Specific Gravity, Urine 1.003 (L) 1.005 - 1.030   pH 7.0 5.0 - 8.0   Glucose, UA NEGATIVE NEGATIVE mg/dL   Hgb urine dipstick SMALL (A) NEGATIVE   Bilirubin Urine NEGATIVE NEGATIVE   Ketones, ur NEGATIVE NEGATIVE mg/dL   Protein, ur NEGATIVE NEGATIVE mg/dL   Nitrite NEGATIVE NEGATIVE   Leukocytes, UA NEGATIVE NEGATIVE   RBC / HPF 0-5 0 - 5 RBC/hpf   WBC, UA 0-5 0 - 5 WBC/hpf   Bacteria, UA NONE SEEN NONE SEEN   Squamous Epithelial / LPF NONE SEEN 0 - 5    Comment: Performed at [facility] [address], [place], [place] [address]  Urine Drug Screen, Qualitative     Status: None   Collection Time: 07/24/18 11:15 AM  Result Value Ref Range   Tricyclic, Ur Screen NONE DETECTED NONE DETECTED   Amphetamines, Ur Screen NONE DETECTED NONE DETECTED   MDMA (Ecstasy)Ur Screen NONE DETECTED NONE DETECTED   Cocaine Metabolite,Ur [place] NONE DETECTED NONE DETECTED   Opiate, Ur Screen NONE DETECTED NONE DETECTED   Phencyclidine (PCP) Ur S NONE DETECTED NONE DETECTED   Cannabinoid 50 Ng, Ur [place] NONE DETECTED NONE DETECTED   Barbiturates, Ur Screen NONE DETECTED NONE DETECTED   Benzodiazepine, Ur Scrn NONE DETECTED NONE DETECTED   Methadone Scn, Ur NONE DETECTED NONE DETECTED    Comment: (NOTE) Tricyclics + metabolites, urine    Cutoff 1000 ng/mL Amphetamines + metabolites, urine  Cutoff 1000 ng/mL MDMA (Ecstasy), urine              Cutoff 500 ng/mL Cocaine Metabolite, urine          Cutoff 300 ng/mL Opiate + metabolites, urine        Cutoff 300 ng/mL Phencyclidine (PCP), urine         Cutoff 25 ng/mL Cannabinoid, urine                 Cutoff 50 ng/mL Barbiturates + metabolites, urine  Cutoff 200 ng/mL Benzodiazepine, urine              Cutoff 200 ng/mL Methadone,  urine                   Cutoff 300 ng/mL The urine drug screen provides only a preliminary, unconfirmed analytical test result and should not be used for non-medical purposes. Clinical consideration and professional judgment should be applied to any positive drug screen result due to possible interfering substances. A more specific alternate chemical method must be used  in order to obtain a confirmed analytical result. Gas chromatography / mass spectrometry (GC/MS) is the preferred confirmat ory method. Performed at [facility], [address] [place], [place] [address]   Osmolality, urine     Status: Abnormal   Collection Time: 07/24/18 11:15 AM  Result Value Ref Range   Osmolality, Ur 147 (L) 300 - 900 mOsm/kg    Comment: Performed at [facility] [address] [place], [place] [address]  Sodium, urine, random     Status: None   Collection Time: 07/24/18 11:15 AM  Result Value Ref Range   Sodium, Ur 38 mmol/L    Comment: Performed at [facility], [address] [place], [place] [address]  Glucose, capillary     Status: None   Collection Time: 07/24/18 11:27 AM  Result Value Ref Range   Glucose-Capillary 92 70 - 99 mg/dL  CBC with Differential     Status: Abnormal   Collection Time: 07/24/18 11:29 AM  Result Value Ref Range   WBC 8.2 3.8 - 10.6 K/uL   RBC 3.70 (L) 4.40 - 5.90 MIL/uL   Hemoglobin 11.9 (L) 13.0 - 18.0 g/dL   HCT 33.1 (L) 40.0 - 52.0 %   MCV 89.6 80.0 - 100.0 fL   MCH 32.3 26.0 - 34.0 pg   MCHC 36.0 32.0 - 36.0 g/dL   RDW 15.4 (H) 11.5 - 14.5 %   Platelets 169 150 - 440 K/uL   Neutrophils Relative % 68 %   Neutro Abs 5.6 1.4 - 6.5 K/uL   Lymphocytes Relative 22 %   Lymphs Abs 1.8 1.0 - 3.6 K/uL   Monocytes Relative 8 %   Monocytes Absolute 0.6 0.2 - 1.0 K/uL   Eosinophils Relative 1 %   Eosinophils Absolute 0.0 0 - 0.7 K/uL   Basophils Relative 1 %   Basophils Absolute 0.0 0 - 0.1 K/uL    Comment: Performed at [facility], [address] [place], [place] [address]  Basic metabolic panel     Status: Abnormal   Collection Time: 07/24/18 11:29 AM  Result Value Ref Range   Sodium 124 (L) 135 - 145 mmol/L   Potassium 4.1 3.5 - 5.1 mmol/L   Chloride 92 (L) 98 - 111 mmol/L   CO2 20 (L) 22 - 32 mmol/L   Glucose, Bld 103 (H) 70 - 99 mg/dL   BUN 6 6 - 20 mg/dL   Creatinine, Ser 0.77 0.61 - 1.24 mg/dL   Calcium 9.3 8.9 - 10.3 mg/dL   GFR calc non Af Amer >60 >60 mL/min   GFR calc Af Amer >60 >60 mL/min    Comment: (NOTE) The eGFR has been calculated using the CKD EPI equation. This calculation has not been validated in all clinical situations. eGFR's persistently <60 mL/min signify possible Chronic Kidney Disease.    Anion gap 12 5 - 15    Comment: Performed at [facility], [place], [place] [address]  Valproic acid level     Status: Abnormal   Collection Time: 07/24/18 11:29 AM  Result Value Ref Range   Valproic Acid Lvl 135 (H) 50.0 - 100.0 ug/mL    Comment: Performed at [facility], [address] [place], [place] [address]  Glucose, capillary     Status: None   Collection Time: 07/24/18  4:37 PM  Result Value Ref Range   Glucose-Capillary 93 70 - 99 mg/dL  Osmolality  Status: Abnormal   Collection Time: 07/24/18  8:45 PM  Result Value Ref Range   Osmolality 268 (L) 275 - 295 mOsm/kg    Comment: Performed at [facility], [place] [address], [place], [place] [address]  TSH     Status: None   Collection Time: 07/24/18  8:45 PM  Result Value Ref Range   TSH 1.272 0.350 - 4.500 uIU/mL    Comment: Performed by a 3rd Generation assay with a functional sensitivity of <=0.01 uIU/mL. Performed at [facility], [address] [place], [place] [address]   Cortisol     Status: None   Collection Time: 07/24/18  8:45 PM  Result Value Ref Range   Cortisol, Plasma 7.8 ug/dL    Comment: (NOTE) AM    6.7 - 22.6 ug/dL PM   <10.0        ug/dL Performed at [facility] [address] [place], [place] [address]   Uric acid     Status: None   Collection Time: 07/24/18  8:45 PM  Result Value Ref Range   Uric Acid, Serum 4.3 3.7 - 8.6 mg/dL    Comment: Performed at [facility], [address] [place], [place] [address]  Basic metabolic panel     Status: Abnormal   Collection Time: 07/25/18  5:38 AM  Result Value Ref Range   Sodium 132 (L) 135 - 145 mmol/L   Potassium 3.5 3.5 - 5.1 mmol/L   Chloride 100 98 - 111 mmol/L   CO2 21 (L) 22 - 32 mmol/L   Glucose, Bld 150 (H) 70 - 99 mg/dL   BUN 12 6 - 20 mg/dL   Creatinine, Ser 0.74 0.61 - 1.24 mg/dL   Calcium 8.9 8.9 - 10.3 mg/dL   GFR calc non Af Amer >60 >60 mL/min   GFR calc Af Amer >60 >60 mL/min    Comment: (NOTE) The eGFR has been calculated using the CKD EPI equation. This calculation has not been validated in all clinical situations. eGFR's persistently <60 mL/min signify possible Chronic Kidney Disease.    Anion gap 11 5 - 15    Comment: Performed at [facility], [address] [place], [place] [address]  CBC     Status: Abnormal   Collection Time: 07/25/18  5:38 AM  Result Value Ref Range   WBC 7.1 3.8 - 10.6 K/uL   RBC 3.48 (L) 4.40 - 5.90 MIL/uL   Hemoglobin 11.4 (L) 13.0 - 18.0 g/dL   HCT 31.4 (L) 40.0 - 52.0 %   MCV 90.3 80.0 - 100.0 fL   MCH 32.6 26.0 - 34.0 pg   MCHC 36.1 (H) 32.0 - 36.0 g/dL   RDW 15.6 (H) 11.5 - 14.5 %   Platelets 143 (L) 150 - 440 K/uL    Comment: Performed at [facility], [address] [place], [place] [address]  Glucose, capillary     Status: Abnormal   Collection Time: 07/25/18  5:57 AM  Result Value Ref Range   Glucose-Capillary 156 (H) 70 - 99 mg/dL   Comment 1 Notify RN   Glucose, capillary     Status: Abnormal   Collection Time: 07/25/18  7:37 AM  Result Value Ref Range   Glucose-Capillary 140 (H) 70 - 99 mg/dL   Comment 1 Notify RN   Glucose, capillary     Status:  Abnormal   Collection Time: 07/25/18 11:33 AM  Result Value Ref Range   Glucose-Capillary  120 (H) 70 - 99 mg/dL   Comment 1 Notify RN   Valproic acid level     Status: None   Collection Time: 07/25/18 12:48 PM  Result Value Ref Range   Valproic Acid Lvl 54 50.0 - 100.0 ug/mL    Comment: Performed at [facility], [place], [place] [address]  Glucose, capillary     Status: Abnormal   Collection Time: 07/25/18  5:07 PM  Result Value Ref Range   Glucose-Capillary 154 (H) 70 - 99 mg/dL   Comment 1 Notify RN     Current Facility-Administered Medications  Medication Dose Route Frequency Provider Last Rate Last Dose  . amLODipine (NORVASC) tablet 5 mg  5 mg Oral Daily Bettey Costa, MD   5 mg at 07/25/18 0814  . benztropine (COGENTIN) tablet 1 mg  1 mg Oral BID Bettey Costa, MD   1 mg at 07/25/18 0813  . cholecalciferol (VITAMIN D) tablet 2,000 Units  2,000 Units Oral Daily Bettey Costa, MD   2,000 Units at 07/25/18 [id number]  . clonazePAM (KLONOPIN) tablet 1 mg  1 mg Oral QID Clapacs, Madie Reno, MD   1 mg at 07/25/18 1711  . enoxaparin (LOVENOX) injection 40 mg  40 mg Subcutaneous Q24H Bettey Costa, MD   40 mg at 07/24/18 2148  . fluPHENAZine (PROLIXIN) tablet 10 mg  10 mg Oral TID Bettey Costa, MD   10 mg at 07/25/18 1702  . HYDROcodone-acetaminophen (NORCO/VICODIN) 5-325 MG per tablet 1-2 tablet  1-2 tablet Oral Q4H PRN Mody, Sital, MD      . insulin aspart (novoLOG) injection 0-9 Units  0-9 Units Subcutaneous TID WC Bettey Costa, MD   2 Units at 07/25/18 1712  . insulin detemir (LEVEMIR) injection 28 Units  28 Units Subcutaneous Daily Bettey Costa, MD   28 Units at 07/25/18 1148  . lactulose (CHRONULAC) 10 GM/15ML solution 10 g  10 g Oral BID Bettey Costa, MD   10 g at 07/25/18 0812  . levETIRAcetam (KEPPRA XR) 24 hr tablet 1,500 mg  1,500 mg Oral Daily Pyreddy, Pavan, MD   1,500 mg at 07/25/18 1347  . levETIRAcetam (KEPPRA) IVPB 1000 mg/100 mL premix  1,000 mg Intravenous Once  Schuyler Amor, MD      . lisinopril (PRINIVIL,ZESTRIL) tablet 20 mg  20 mg Oral Daily Bettey Costa, MD   20 mg at 07/25/18 0811  . LORazepam (ATIVAN) injection 1 mg  1 mg Intravenous Q4H PRN Bettey Costa, MD   1 mg at 07/24/18 1431  . ondansetron (ZOFRAN) tablet 4 mg  4 mg Oral Q6H PRN Mody, Sital, MD       Or  . ondansetron (ZOFRAN) injection 4 mg  4 mg Intravenous Q6H PRN Mody, Sital, MD      . pantoprazole (PROTONIX) EC tablet 40 mg  40 mg Oral Daily Bettey Costa, MD   40 mg at 07/25/18 0813  . perphenazine (TRILAFON) tablet 8 mg  8 mg Oral TID Clapacs, Madie Reno, MD   8 mg at 07/25/18 1702  . polyethylene glycol (MIRALAX / GLYCOLAX) packet 17 g  17 g Oral Daily PRN Mody, Sital, MD      . sodium chloride tablet 1 g  1 g Oral Daily Bettey Costa, MD   1 g at 07/25/18 0812  . traZODone (DESYREL) tablet 100 mg  100 mg Oral QHS Bettey Costa, MD   100 mg at 07/24/18 2148    Musculoskeletal: Strength & Muscle Tone:  decreased Gait & Station: unsteady Patient leans: N/A  Psychiatric Specialty Exam: Physical Exam  Nursing note and vitals reviewed. Constitutional: He appears well-developed and well-nourished.  HENT:  Head: Normocephalic and atraumatic.  Eyes: Pupils are equal, round, and reactive to light. Conjunctivae are normal.  Neck: Normal range of motion.  Cardiovascular: Normal heart sounds.  Respiratory: Effort normal.  GI: Soft.  Musculoskeletal: Normal range of motion.  Neurological: He is alert.  Skin: Skin is warm and dry.  Psychiatric: His affect is blunt. His speech is delayed. He is slowed.    Review of Systems  Constitutional: Negative.   HENT: Negative.   Eyes: Negative.   Respiratory: Negative.   Cardiovascular: Negative.   Gastrointestinal: Negative.   Musculoskeletal: Negative.   Skin: Negative.   Neurological: Negative.   Psychiatric/Behavioral: Negative.     Blood pressure 116/84, pulse 92, temperature 98 F (36.7 C), temperature source Oral, resp. rate (!) 21,  height 6' 6" (1.981 m), weight 111 kg, SpO2 100 %.Body mass index is 28.28 kg/m.  General Appearance: Disheveled  Eye Contact:  Minimal  Speech:  Slow  Volume:  Decreased  Mood:  Euthymic  Affect:  Constricted  Thought Process:  Goal Directed  Orientation:  Full (Time, Place, and Person)  Thought Content:  Logical  Suicidal Thoughts:  No  Homicidal Thoughts:  No  Memory:  Immediate;   Fair Recent;   Fair Remote;   Fair  Judgement:  Impaired  Insight:  Shallow  Psychomotor Activity:  Decreased  Concentration:  Concentration: Fair  Recall:  [employer] of Knowledge:  Fair  Language:  Fair  Akathisia:  No  Handed:  Right  AIMS (if indicated):     Assets:  Desire for Improvement Housing  ADL's:  Intact  Cognition:  Impaired,  Mild  Sleep:        Treatment Plan Summary: Daily contact with patient to assess and evaluate symptoms and progress in treatment, Medication management and Plan Continue with current antipsychotic and antianxiety medication.  Continue off of the Depakote for now.  No change to medication for today.  I will sign off to the doctors on call over the weekend.  I would hope that once he is medically stabilized he would be able to go back to his group home as he is not currently threatening or acutely dangerous.  Disposition: No evidence of imminent risk to self or others at present.   Patient does not meet criteria for psychiatric inpatient admission. Supportive therapy provided about ongoing stressors.  Alethia Berthold, MD 07/25/2018 7:08 PM

## 2018-07-25 NOTE — Progress Notes (Signed)
[facility] [place] [facility]   PATIENT NAME: Oscar Duke    MR#:  [id number]  DATE OF BIRTH:  [birth year]  SUBJECTIVE:  CHIEF COMPLAINT:   Chief Complaint  Patient presents with  . Seizures  Patient seen and evaluated today No new episodes of seizures Not completely oriented Responds to verbal commands Has some periods of mania  REVIEW OF SYSTEMS:    ROS  Could not be obtained completely secondary to underlying schizophrenia  DRUG ALLERGIES:   Allergies  Allergen Reactions  . Haldol [Haloperidol Lactate]   . Inderal [Propranolol]   . Januvia [Sitagliptin] Other (See Comments)    Skin discoloration on lower legs  . Lithium Other (See Comments)    Seizures    VITALS:  Blood pressure 116/84, pulse 92, temperature 98 F (36.7 C), temperature source Oral, resp. rate (!) 21, height 6\' 6"  (1.981 m), weight 111 kg, SpO2 100 %.  PHYSICAL EXAMINATION:   Physical Exam  GENERAL:  47 y.o.-year-old patient lying in the bed with no acute distress.  EYES: Pupils equal, round, reactive to light and accommodation. No scleral icterus. Extraocular muscles intact.  HEENT: Head atraumatic, normocephalic. Oropharynx and nasopharynx clear.  NECK:  Supple, no jugular venous distention. No thyroid enlargement, no tenderness.  LUNGS: Normal breath sounds bilaterally, no wheezing, rales, rhonchi. No use of accessory muscles of respiration.  CARDIOVASCULAR: S1, S2 normal. No murmurs, rubs, or gallops.  ABDOMEN: Soft, nontender, nondistended. Bowel sounds present. No organomegaly or mass.  EXTREMITIES: No cyanosis, clubbing or edema b/l.    NEUROLOGIC: Cranial nerves II through XII are intact. No focal Motor or sensory deficits b/l.   PSYCHIATRIC: Has.'s of mania SKIN: No obvious rash, lesion, or ulcer.   LABORATORY PANEL:   CBC Recent Labs  Lab 07/25/18 0538  WBC 7.1  HGB 11.4*  HCT 31.4*  PLT 143*    ------------------------------------------------------------------------------------------------------------------ Chemistries  Recent Labs  Lab 07/25/18 0538  NA 132*  K 3.5  CL 100  CO2 21*  GLUCOSE 150*  BUN 12  CREATININE 0.74  CALCIUM 8.9   ------------------------------------------------------------------------------------------------------------------  Cardiac Enzymes No results for input(s): TROPONINI in the last 168 hours. ------------------------------------------------------------------------------------------------------------------  RADIOLOGY:  No results found.   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 Management plans discussed with the patient, family and they are in agreement.  CODE STATUS: Full code  DVT Prophylaxis: SCDs  TOTAL TIME TAKING CARE OF THIS PATIENT: 34 minutes.   POSSIBLE D/C IN 1 to 2 DAYS, DEPENDING ON CLINICAL CONDITION.  Ihor AustinPavan Pyreddy M.D on 07/25/2018 at 2:56 PM  Between 7am to 6pm - Pager - [phone]  After 6pm go to [email or web address] - password [email or web address] [facility]  [facility] [place] [facility]  Office   [phone]  CC: Primary care physician; Duffy RhodyPartridge, [place], FNP  Note: This dictation  was prepared with Dragon dictation along with smaller phrase technology. Any transcriptional errors that result from this process are unintentional.

## 2018-07-25 NOTE — Evaluation (Signed)
Physical Therapy Evaluation Patient Details Name: Oscar Duke MRN: [id number] DOB: December 28, 1970 Today's Date: 07/25/2018   History of Present Illness   47 y.o. male with a known history of seizure and schizo affective disorder who presented to the ED (where he did have a witnessed seizure) via EMS from group home for "difficulty breathing and not being treated "right at the group home".  (Per medical records) Pt was just here earlier this week with the same.  Clinical Impression  Pt did well with ambulation with and w/o AD but was impulsive t/o the session.  This appears to be essentially baseline (phyiscal and mental status) and pt feels confident about being able to return to his transition home as long as his seizure related issues are cleared.  Pt does not require further PT on discharge and orders will be completed.     Home health PT    Equipment Recommendations  Rolling walker with 5" wheels    Recommendations for Other Services       Precautions / Restrictions Precautions Precautions: Fall Restrictions Weight Bearing Restrictions: No      Mobility  Bed Mobility Overal bed mobility: Modified Independent Bed Mobility: Supine to Sit;Sit to Supine     Supine to sit: Modified independent (Device/Increase time) Sit to supine: Modified independent (Device/Increase time)   General bed mobility comments: Pt easily and confidently able to get to sitting EOB and back to supine  Transfers Overall transfer level: Needs assistance Equipment used: None Transfers: Sit to/from Stand Sit to Stand: Modified independent (Device/Increase time)            Ambulation/Gait Ambulation/Gait assistance: Min guard Gait Distance (Feet): 350 Feet Assistive device: Rolling walker (2 wheeled);None       General Gait Details: Pt initially needing a lot of cuing for safety/directional awareness, keeping himself upright and generally staying on task.  Pt with no LOBs, O2 stayed in the  high 90s but his HR did increase to the 120s with the effort.  He did not report being overly fatigued and was eager to walk as much as he could  Information systems manager [employer] (Stroke Patients Only)       Balance Overall balance assessment: Modified Independent             Standing balance comment: Pt needing reminders secondary to safety awareness, but did not have any overt LOBs                             Pertinent Vitals/Pain Pain Assessment: (general pain from falls)    Home Living Family/patient expects to be discharged to:: Group home               Home Equipment: (states he has a walker?)      Prior Function Level of Independence: Independent         Comments: Indep without assist device for mobility; does endorse multiple fall history, increased in frequency over recent weeks     Hand Dominance        Extremity/Trunk Assessment   Upper Extremity Assessment Upper Extremity Assessment: Overall WFL for tasks assessed    Lower Extremity Assessment Lower Extremity Assessment: Overall WFL for tasks assessed       Communication   Communication: No difficulties  Cognition Arousal/Alertness: Awake/alert Behavior During Therapy: Impulsive Overall Cognitive Status: Within  Functional Limits for tasks assessed                                 General Comments: delayed processing      General Comments      Exercises     Assessment/Plan    PT Assessment Patent does not need any further PT services  PT Problem List Decreased strength;Decreased activity tolerance;Decreased balance;Decreased mobility;Decreased coordination;Decreased cognition;Decreased knowledge of use of DME;Decreased safety awareness;Decreased knowledge of precautions       PT Treatment Interventions DME instruction;Gait training;Stair training;Functional mobility training;Therapeutic activities;Therapeutic  exercise;Balance training;Patient/family education    PT Goals (Current goals can be found in the Care Plan section)  Acute Rehab PT Goals Patient Stated Goal: go home PT Goal Formulation: All assessment and education complete, DC therapy    Frequency Min 2X/week   Barriers to discharge        Co-evaluation               AM-PAC PT "6 Clicks" Daily Activity  Outcome Measure Difficulty turning over in bed (including adjusting bedclothes, sheets and blankets)?: None Difficulty moving from lying on back to sitting on the side of the bed? : None Difficulty sitting down on and standing up from a chair with arms (e.g., wheelchair, bedside commode, etc,.)?: None Help needed moving to and from a bed to chair (including a wheelchair)?: None Help needed walking in hospital room?: None Help needed climbing 3-5 steps with a railing? : None 6 Click Score: 24    End of Session Equipment Utilized During Treatment: Gait belt Activity Tolerance: Patient tolerated treatment well Patient left: with bed alarm set;with call bell/phone within reach Nurse Communication: Mobility status PT Visit Diagnosis: Muscle weakness (generalized) (M62.81);Difficulty in walking, not elsewhere classified (R26.2);Repeated falls (R29.6)    Time: [id number] PT Time Calculation (min) (ACUTE ONLY): 17 min   Charges:   PT Evaluation $PT Eval Low Complexity: 1 Low          Malachi ProGalen R Earvin Blazier, DPT 07/25/2018, 4:06 PM

## 2018-07-25 NOTE — Progress Notes (Signed)
Subjective: Patient agitated and combative today.  No further seizures noted.    Objective: Current vital signs: BP 116/84 (BP Location: Right Arm)   Pulse 92   Temp 98 F (36.7 C) (Oral)   Resp (!) 21   Ht 6\' 6"  (1.981 m)   Wt 111 kg   SpO2 100%   BMI 28.28 kg/m  Vital signs in last 24 hours: Temp:  [98 F (36.7 C)-98.4 F (36.9 C)] 98 F (36.7 C) (09/13 1129) Pulse Rate:  [82-93] 92 (09/13 1129) Resp:  [18-21] 21 (09/13 0455) BP: (116-146)/(82-91) 116/84 (09/13 1129) SpO2:  [97 %-100 %] 100 % (09/13 1129)  Intake/Output from previous day: 09/12 0701 - 09/13 0700 In: 1000 [IV Piggyback:1000] Out: 3195 [Urine:3195] Intake/Output this shift: Total I/O In: 480 [P.O.:480] Out: 850 [Urine:850] Nutritional status:  Diet Order            Diet regular Room service appropriate? Yes; Fluid consistency: Thin; Fluid restriction: 1500 mL Fluid  Diet effective now              Neurologic Exam: Mental Status: Alert and awake.  Speech fluent.  Agitated. Cranial Nerves: Smile symetric Motor: Moves all extremities strongly with no focal weakness noted  Lab Results: Basic Metabolic Panel: Recent Labs  Lab 07/20/18 1755 07/21/18 0059 07/24/18 1129 07/25/18 0538  NA 123* 129* 124* 132*  K 3.8 3.8 4.1 3.5  CL 91* 95* 92* 100  CO2 24 26 20* 21*  GLUCOSE 130* 149* 103* 150*  BUN 8 8 6 12   CREATININE 0.76 0.80 0.77 0.74  CALCIUM 8.9 8.8* 9.3 8.9    Liver Function Tests: No results for input(s): AST, ALT, ALKPHOS, BILITOT, PROT, ALBUMIN in the last 168 hours. No results for input(s): LIPASE, AMYLASE in the last 168 hours. No results for input(s): AMMONIA in the last 168 hours.  CBC: Recent Labs  Lab 07/20/18 1755 07/21/18 0059 07/24/18 1129 07/25/18 0538  WBC 6.2 7.5 8.2 7.1  NEUTROABS  --   --  5.6  --   HGB 12.2* 12.0* 11.9* 11.4*  HCT 33.2* 33.6* 33.1* 31.4*  MCV 88.3 90.2 89.6 90.3  PLT 171 161 169 143*    Cardiac Enzymes: No results for input(s):  CKTOTAL, CKMB, CKMBINDEX, TROPONINI in the last 168 hours.  Lipid Panel: No results for input(s): CHOL, TRIG, HDL, CHOLHDL, VLDL, LDLCALC in the last 168 hours.  CBG: Recent Labs  Lab 07/24/18 1127 07/24/18 1637 07/25/18 0557 07/25/18 0737 07/25/18 1133  GLUCAP 92 93 156* 140* 120*    Microbiology: Results for orders placed or performed during the hospital encounter of 07/09/18  MRSA PCR Screening     Status: None   Collection Time: 07/10/18  4:39 AM  Result Value Ref Range Status   MRSA by PCR NEGATIVE NEGATIVE Final    Comment:        The GeneXpert MRSA Assay (FDA approved for NASAL specimens only), is one component of a comprehensive MRSA colonization surveillance program. It is not intended to diagnose MRSA infection nor to guide or monitor treatment for MRSA infections. Performed at [facility], [address] [place], [place] [address]     Coagulation Studies: No results for input(s): LABPROT, INR in the last 72 hours.  Imaging: No results found.  Medications:  I have reviewed the patient's current medications. Scheduled: . amLODipine  5 mg Oral Daily  . benztropine  1 mg Oral BID  . cholecalciferol  2,000 Units Oral Daily  .  clonazePAM  1 mg Oral QID  . enoxaparin (LOVENOX) injection  40 mg Subcutaneous Q24H  . fluPHENAZine  10 mg Oral TID  . insulin aspart  0-9 Units Subcutaneous TID WC  . insulin detemir  28 Units Subcutaneous Daily  . lactulose  10 g Oral BID  . levETIRAcetam  1,500 mg Oral Daily  . lisinopril  20 mg Oral Daily  . pantoprazole  40 mg Oral Daily  . perphenazine  8 mg Oral TID  . sodium chloride  1 g Oral Daily  . traZODone  100 mg Oral QHS    Assessment/Plan: No further seizures noted.  No CR dosing to obtain 1250.  Will increase Keppra CR to 1500.  Depakote level pending.  Will continue to follow with you     LOS: 1 day   Thana FarrLeslie Andrea Ferrer, MD Neurology [phone] 07/25/2018  12:18 PM

## 2018-07-26 LAB — BASIC METABOLIC PANEL
ANION GAP: 6 (ref 5–15)
BUN: 17 mg/dL (ref 6–20)
CO2: 23 mmol/L (ref 22–32)
Calcium: 9.1 mg/dL (ref 8.9–10.3)
Chloride: 104 mmol/L (ref 98–111)
Creatinine, Ser: 0.85 mg/dL (ref 0.61–1.24)
GFR calc Af Amer: >60 mL/min (ref >60)
Glucose, Bld: 130 mg/dL — ABNORMAL HIGH (ref 70–99)
POTASSIUM: 4.1 mmol/L (ref 3.5–5.1)
Sodium: 133 mmol/L — ABNORMAL LOW (ref 135–145)

## 2018-07-26 LAB — GLUCOSE, CAPILLARY: Glucose-Capillary: 120 mg/dL — ABNORMAL HIGH (ref 70–99)

## 2018-07-26 MED ORDER — LEVETIRACETAM ER 750 MG PO TB24: 1500.0000 mg | ORAL_TABLET | Freq: Every day | ORAL | 0 refills | Status: DC

## 2018-07-26 MED ORDER — CLONAZEPAM 1 MG PO TABS: 1.0000 mg | ORAL_TABLET | Freq: Four times a day (QID) | ORAL | 0 refills | Status: DC

## 2018-07-26 NOTE — Discharge Summary (Signed)
[employer] [facility] [place] [facility]   PATIENT NAME: Oscar Duke    MR#:  [id number]  DATE OF BIRTH:  03-26-1971  DATE OF ADMISSION:  07/24/2018 ADMITTING PHYSICIAN: Adrian Saran, MD  DATE OF DISCHARGE: 07/26/2018  PRIMARY CARE PHYSICIAN: Duffy Rhody, FNP    ADMISSION DIAGNOSIS:  Seizure (HCC) [R56.9]  DISCHARGE DIAGNOSIS:  Principal Problem:   Schizoaffective disorder, bipolar type (HCC) Active Problems:   Hyponatremia   Seizure (HCC)   Water intoxication syndrome   SECONDARY DIAGNOSIS:   Past Medical History:  Diagnosis Date  . Bipolar 1 disorder (HCC)   . Diabetes mellitus without complication (HCC)   . Hyperlipidemia   . Hypertension   . 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 Schizophrenia, acute (HCC)   . Seizures [facility])     HOSPITAL COURSE:    47 year old male with a history of seizure disorder who presented with seizures.  1.  Breakthrough seizure: Patient was followed by neurology.  Keppra has been increased to 1500 mg daily.  He will continue Depakote.  2.  Hyponatremia on chronic hyponatremia: Sodium level has improved. He should continue 1500 cc fluid restriction. 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  Schizoaffective disorder: Patient will continue outpatient medications  4.  Essential hypertension: Continue Norvasc and lisinopril  5.  Diabetes: Continue outpatient regimen with ADA diet   DISCHARGE CONDITIONS AND DIET:   Stable for discharge on heart healthy diabetic diet 1500 cc fluid restriction CONSULTS OBTAINED:  Treatment Team:  Kym Groom, MD Thana Farr, MD Mady Haagensen, MD Clapacs, Jackquline Denmark, MD  DRUG ALLERGIES:   Allergies  Allergen Reactions  . Haldol [Haloperidol Lactate]   . Inderal [Propranolol]   . Januvia [Sitagliptin] Other (See Comments)    Skin discoloration on lower legs  . Lithium Other (See Comments)    Seizures    DISCHARGE  MEDICATIONS:   Allergies as of 07/26/2018      Reactions   Haldol [haloperidol Lactate]    Inderal [propranolol]    Januvia [sitagliptin] Other (See Comments)   Skin discoloration on lower legs   Lithium Other (See Comments)   Seizures      Medication List    STOP taking these medications   levETIRAcetam 1000 MG tablet Commonly known as:  KEPPRA Replaced by:  Levetiracetam 750 MG Tb24     TAKE these medications   acetaminophen 500 MG tablet Commonly known as:  TYLENOL Take 1 tablet (500 mg total) by mouth every 6 (six) hours as needed for mild pain, moderate pain, fever or headache.   amLODipine 5 MG tablet Commonly known as:  NORVASC Take 1 tablet (5 mg total) by mouth daily.   benztropine 1 MG tablet Commonly known as:  COGENTIN Take 1 tablet (1 mg total) by mouth 2 (two) times daily.   clonazePAM 1 MG tablet Commonly known as:  KLONOPIN Take 1 tablet (1 mg total) by mouth 4 (four) times daily. What changed:  when to take this   divalproex 500 MG DR tablet Commonly known as:  DEPAKOTE Take 1 tablet (500 mg total) by mouth every 12 (twelve) hours.   fluPHENAZine 10 MG tablet Commonly known as:  PROLIXIN Take 10 mg by mouth 3 (three) times daily.   ibuprofen 400 MG tablet Commonly known as:  ADVIL,MOTRIN Take 1 tablet (400 mg total) by mouth every 6 (six) hours as needed for fever, headache, mild pain, moderate pain or cramping.  insulin aspart 100 UNIT/ML injection Commonly known as:  novoLOG Inject 0-15 Units into the skin 3 (three) times daily with meals. CBG < 70: implement hypoglycemia protocol CBG 70 - 120: 0 units CBG 121 - 150: 2 units CBG 151 - 200: 3 units CBG 201 - 250: 5 units CBG 251 - 300: 8 units CBG 301 - 350: 11 units CBG 351 - 400: 15 units CBG > 400 call MD and obtain STAT lab verification   insulin detemir 100 UNIT/ML injection Commonly known as:  LEVEMIR Inject 0.28 mLs (28 Units total) into the skin daily.   lactulose 10 GM/15ML  solution Commonly known as:  CHRONULAC Take 15 mLs (10 g total) by mouth 2 (two) times daily.   Levetiracetam 750 MG Tb24 Take 2 tablets (1,500 mg total) by mouth daily. Replaces:  levETIRAcetam 1000 MG tablet   lisinopril 20 MG tablet Commonly known as:  PRINIVIL,ZESTRIL Take 1 tablet (20 mg total) by mouth daily.   pantoprazole 40 MG tablet Commonly known as:  PROTONIX Take 1 tablet (40 mg total) by mouth daily.   perphenazine 8 MG tablet Commonly known as:  TRILAFON Take 1 tablet (8 mg total) by mouth 3 (three) times daily.   sodium chloride 1 g tablet Take 1 tablet (1 g total) by mouth daily.   traZODone 100 MG tablet Commonly known as:  DESYREL Take 1 tablet (100 mg total) by mouth at bedtime.   Vitamin D3 2000 units Tabs Take 2,000 Units by mouth daily.         Today   CHIEF COMPLAINT:   Patient doing okay this morning no acute events overnight   VITAL SIGNS:  Blood pressure 128/82, pulse (!) 112, temperature 98.9 F (37.2 C), temperature source Oral, resp. rate 20, height 6\' 6"  (1.981 m), weight 111 kg, SpO2 96 %.   REVIEW OF SYSTEMS:  Review of Systems  Constitutional: Negative.  Negative for chills, fever and malaise/fatigue.  HENT: Negative.  Negative for ear discharge, ear pain, hearing loss, nosebleeds and sore throat.   Eyes: Negative.  Negative for blurred vision and pain.  Respiratory: Negative.  Negative for cough, hemoptysis, shortness of breath and wheezing.   Cardiovascular: Negative.  Negative for chest pain, palpitations and leg swelling.  Gastrointestinal: Negative.  Negative for abdominal pain, blood in stool, diarrhea, nausea and vomiting.  Genitourinary: Negative.  Negative for dysuria.  Musculoskeletal: Negative.  Negative for back pain.  Skin: Negative.   Neurological: Negative for dizziness, tremors, speech change, focal weakness, seizures and headaches.  Endo/Heme/Allergies: Negative.  Does not bruise/bleed easily.   Psychiatric/Behavioral: Negative for depression, hallucinations and suicidal ideas.       SAD     PHYSICAL EXAMINATION:  GENERAL:  47 y.o.-year-old patient lying in the bed with no acute distress.  NECK:  Supple, no jugular venous distention. No thyroid enlargement, no tenderness.  LUNGS: Normal breath sounds bilaterally, no wheezing, rales,rhonchi  No use of accessory muscles of respiration.  CARDIOVASCULAR: S1, S2 normal. No murmurs, rubs, or gallops.  ABDOMEN: Soft, non-tender, non-distended. Bowel sounds present. No organomegaly or mass.  EXTREMITIES: No pedal edema, cyanosis, or clubbing.  PSYCHIATRIC: The patient is alert and oriented x 3.  SKIN: No obvious rash, lesion, or ulcer.   DATA REVIEW:   CBC Recent Labs  Lab 07/25/18 0538  WBC 7.1  HGB 11.4*  HCT 31.4*  PLT 143*    Chemistries  Recent Labs  Lab 07/26/18 0547  NA 133*  K  4.1  CL 104  CO2 23  GLUCOSE 130*  BUN 17  CREATININE 0.85  CALCIUM 9.1    Cardiac Enzymes No results for input(s): TROPONINI in the last 168 hours.  Microbiology Results  @MICRORSLT48 @  RADIOLOGY:  No results found.    Allergies as of 07/26/2018      Reactions   Haldol [haloperidol Lactate]    Inderal [propranolol]    Januvia [sitagliptin] Other (See Comments)   Skin discoloration on lower legs   Lithium Other (See Comments)   Seizures      Medication List    STOP taking these medications   levETIRAcetam 1000 MG tablet Commonly known as:  KEPPRA Replaced by:  Levetiracetam 750 MG Tb24     TAKE these medications   acetaminophen 500 MG tablet Commonly known as:  TYLENOL Take 1 tablet (500 mg total) by mouth every 6 (six) hours as needed for mild pain, moderate pain, fever or headache.   amLODipine 5 MG tablet Commonly known as:  NORVASC Take 1 tablet (5 mg total) by mouth daily.   benztropine 1 MG tablet Commonly known as:  COGENTIN Take 1 tablet (1 mg total) by mouth 2 (two) times daily.   clonazePAM 1  MG tablet Commonly known as:  KLONOPIN Take 1 tablet (1 mg total) by mouth 4 (four) times daily. What changed:  when to take this   divalproex 500 MG DR tablet Commonly known as:  DEPAKOTE Take 1 tablet (500 mg total) by mouth every 12 (twelve) hours.   fluPHENAZine 10 MG tablet Commonly known as:  PROLIXIN Take 10 mg by mouth 3 (three) times daily.   ibuprofen 400 MG tablet Commonly known as:  ADVIL,MOTRIN Take 1 tablet (400 mg total) by mouth every 6 (six) hours as needed for fever, headache, mild pain, moderate pain or cramping.   insulin aspart 100 UNIT/ML injection Commonly known as:  novoLOG Inject 0-15 Units into the skin 3 (three) times daily with meals. CBG < 70: implement hypoglycemia protocol CBG 70 - 120: 0 units CBG 121 - 150: 2 units CBG 151 - 200: 3 units CBG 201 - 250: 5 units CBG 251 - 300: 8 units CBG 301 - 350: 11 units CBG 351 - 400: 15 units CBG > 400 call MD and obtain STAT lab verification   insulin detemir 100 UNIT/ML injection Commonly known as:  LEVEMIR Inject 0.28 mLs (28 Units total) into the skin daily.   lactulose 10 GM/15ML solution Commonly known as:  CHRONULAC Take 15 mLs (10 g total) by mouth 2 (two) times daily.   Levetiracetam 750 MG Tb24 Take 2 tablets (1,500 mg total) by mouth daily. Replaces:  levETIRAcetam 1000 MG tablet   lisinopril 20 MG tablet Commonly known as:  PRINIVIL,ZESTRIL Take 1 tablet (20 mg total) by mouth daily.   pantoprazole 40 MG tablet Commonly known as:  PROTONIX Take 1 tablet (40 mg total) by mouth daily.   perphenazine 8 MG tablet Commonly known as:  TRILAFON Take 1 tablet (8 mg total) by mouth 3 (three) times daily.   sodium chloride 1 g tablet Take 1 tablet (1 g total) by mouth daily.   traZODone 100 MG tablet Commonly known as:  DESYREL Take 1 tablet (100 mg total) by mouth at bedtime.   Vitamin D3 2000 units Tabs Take 2,000 Units by mouth daily.        A  Management plans discussed  with the patient and he is in agreement. Stable for  discharge   Patient should follow up with pcp  CODE STATUS:     Code Status Orders  (From admission, onward)         Start     Ordered   07/24/18 1355  Full code  Continuous     07/24/18 1354        Code Status History    Date Active Date Inactive Code Status Order ID Comments User Context   07/20/2018 2200 07/21/2018 1722 Full Code [id number]  Oralia Manis, MD Inpatient   07/09/2018 1614 07/12/2018 0012 Full Code [id number]  Adrian Saran, MD Inpatient   05/14/2018 2025 05/28/2018 1801 Full Code [id number]  Audery Amel, MD Inpatient   10/05/2017 1651 10/06/2017 2106 Full Code [id number]  Levie Heritage, DO ED    Advance Directive Documentation     Most Recent Value  Type of Advance Directive  Healthcare Power of Attorney  Pre-existing out of facility DNR order (yellow form or pink MOST form)  -  "MOST" Form in Place?  -      TOTAL TIME TAKING CARE OF THIS PATIENT: 38 minutes.    Note: This dictation was prepared with Dragon dictation along with smaller phrase technology. Any transcriptional errors that result from this process are unintentional.  Rovena Hearld M.D on 07/26/2018 at 9:27 AM  Between 7am to 6pm - Pager - [phone] After 6pm go to [email or web address] - Social research officer, government  [facility]  Office  [phone]  CC: Primary care physician; Duffy Rhody, FNP

## 2018-07-26 NOTE — Progress Notes (Signed)
Patient discharged to group home with self. Patient verbalized understanding of education, patient with no complaints.

## 2018-07-26 NOTE — Progress Notes (Signed)
Pt being discharged back to group home, discharge and prescriptions reviewed with pt, discharge papers sent with pt to group home, taxi transporting pt, pt with no complaints at discharge

## 2018-07-26 NOTE — Care Management Note (Signed)
Case Management Note  Patient Details  Name: Oscar Duke MRN: [id number] Date of Birth: [birth year]  Subjective/Objective:  Patient to be discharged per MD order. Orders in place for home health services. Patient active with [facility]. Patient has legal guardian Denice ParadiseMike Herring, left voicemail for him confirming our plan to restart services, waiting call back. No DME needs. Group home to provide transport.  Buddy DutyJosh Nathanyal Ashmead RN BSN RNCM [phone]                      Action/Plan:   Expected Discharge Date:  07/26/18               Expected Discharge Plan:  Home w Home Health Services  In-House Referral:  Clinical Social Work  Discharge planning Services  CM Consult  Post Acute Care Choice:  Home Health, Resumption of Svcs/PTA Provider Choice offered to:     DME Arranged:    DME Agency:     HH Arranged:  RN, PT, Nurse's Aide HH Agency:  [facility]  Status of Service:  Completed, signed off  If discussed at Long Length of Stay Meetings, dates discussed:    Additional Comments:  Virgel ManifoldJosh A Oneika Simonian, RN 07/26/2018, 11:06 AM

## 2018-07-26 NOTE — Clinical Social Work Note (Addendum)
[facility]. The group home will pay for the taxi as the facilitator Jimmye Norman(Lawanda Ray) has an account with [facility]. The CSW has left a HIPPA compliant voicemail for the patient's HCPOA, Mr. [name] advising of the discharge. The group home representative asked that the CSW send discharge paperwork with the patient rather than faxing. The CSW will deliver the discharge paperwork to the chart then sign off. Please consult should additional needs arise.  Argentina PonderKaren Martha Kendalyn Cranfield, MSW, Theresia MajorsLCSWA [phone]

## 2018-07-28 ENCOUNTER — Encounter: Payer: Self-pay | Admitting: Emergency Medicine

## 2018-07-28 ENCOUNTER — Emergency Department
Admission: EM | Admit: 2018-07-28 | Discharge: 2018-08-06 | Disposition: A | Discharge status: Home / Self Care | Payer: Medicare Other | Attending: Emergency Medicine | Admitting: Emergency Medicine

## 2018-07-28 DIAGNOSIS — F918 Other conduct disorders: Secondary | ICD-10-CM | POA: Diagnosis not present

## 2018-07-28 DIAGNOSIS — F259 Schizoaffective disorder, unspecified: Secondary | ICD-10-CM | POA: Diagnosis not present

## 2018-07-28 DIAGNOSIS — Z008 Encounter for other general examination: Secondary | ICD-10-CM | POA: Insufficient documentation

## 2018-07-28 DIAGNOSIS — E119 Type 2 diabetes mellitus without complications: Secondary | ICD-10-CM | POA: Diagnosis not present

## 2018-07-28 DIAGNOSIS — F250 Schizoaffective disorder, bipolar type: Secondary | ICD-10-CM

## 2018-07-28 DIAGNOSIS — E871 Hypo-osmolality and hyponatremia: Secondary | ICD-10-CM | POA: Insufficient documentation

## 2018-07-28 DIAGNOSIS — I10 Essential (primary) hypertension: Secondary | ICD-10-CM | POA: Insufficient documentation

## 2018-07-28 DIAGNOSIS — F319 Bipolar disorder, unspecified: Secondary | ICD-10-CM | POA: Diagnosis not present

## 2018-07-28 DIAGNOSIS — R4689 Other symptoms and signs involving appearance and behavior: Secondary | ICD-10-CM

## 2018-07-28 LAB — CBC
HCT: 31.8 % — ABNORMAL LOW (ref 40.0–52.0)
Hemoglobin: 11.5 g/dL — ABNORMAL LOW (ref 13.0–18.0)
MCH: 32.6 pg (ref 26.0–34.0)
MCHC: 36.2 g/dL — AB (ref 32.0–36.0)
MCV: 90.3 fL (ref 80.0–100.0)
PLATELETS: 191 10*3/uL (ref 150–440)
RBC: 3.52 MIL/uL — ABNORMAL LOW (ref 4.40–5.90)
RDW: 15.9 % — ABNORMAL HIGH (ref 11.5–14.5)
WBC: 6.2 10*3/uL (ref 3.8–10.6)

## 2018-07-28 LAB — COMPREHENSIVE METABOLIC PANEL
ALK PHOS: 43 U/L (ref 38–126)
ALT: 10 U/L (ref 0–44)
ANION GAP: 8 (ref 5–15)
AST: 14 U/L — ABNORMAL LOW (ref 15–41)
Albumin: 4 g/dL (ref 3.5–5.0)
BUN: 9 mg/dL (ref 6–20)
CALCIUM: 8.9 mg/dL (ref 8.9–10.3)
CO2: 24 mmol/L (ref 22–32)
Chloride: 93 mmol/L — ABNORMAL LOW (ref 98–111)
Creatinine, Ser: 0.68 mg/dL (ref 0.61–1.24)
GFR calc Af Amer: >60 mL/min (ref >60)
GLUCOSE: 104 mg/dL — AB (ref 70–99)
Potassium: 4.1 mmol/L (ref 3.5–5.1)
Sodium: 125 mmol/L — ABNORMAL LOW (ref 135–145)
TOTAL PROTEIN: 7 g/dL (ref 6.5–8.1)
Total Bilirubin: 0.8 mg/dL (ref 0.3–1.2)

## 2018-07-28 LAB — URINE DRUG SCREEN, QUALITATIVE (ARMC ONLY)
AMPHETAMINES, UR SCREEN: NOT DETECTED
BARBITURATES, UR SCREEN: NOT DETECTED
Benzodiazepine, Ur Scrn: NOT DETECTED
CANNABINOID 50 NG, UR ~~LOC~~: NOT DETECTED
Cocaine Metabolite,Ur ~~LOC~~: NOT DETECTED
MDMA (Ecstasy)Ur Screen: NOT DETECTED
METHADONE SCREEN, URINE: NOT DETECTED
OPIATE, UR SCREEN: NOT DETECTED
Phencyclidine (PCP) Ur S: NOT DETECTED
Tricyclic, Ur Screen: NOT DETECTED

## 2018-07-28 LAB — ETHANOL

## 2018-07-28 LAB — GLUCOSE, CAPILLARY: Glucose-Capillary: 154 mg/dL — ABNORMAL HIGH (ref 70–99)

## 2018-07-28 MED ORDER — LEVETIRACETAM ER 500 MG PO TB24
1500.0000 mg | ORAL_TABLET | Freq: Every day | ORAL | Status: DC
  Administered 2018-07-28 – 2018-08-06 (×10): 1500 mg via ORAL
  Filled 2018-07-28 (×12): qty 3

## 2018-07-28 MED ORDER — SODIUM CHLORIDE 1 G PO TABS
1.0000 g | ORAL_TABLET | Freq: Every day | ORAL | Status: DC
  Administered 2018-07-28 – 2018-08-06 (×10): 1 g via ORAL
  Filled 2018-07-28 (×11): qty 1

## 2018-07-28 MED ORDER — INSULIN DETEMIR 100 UNIT/ML ~~LOC~~ SOLN
28.0000 [IU] | Freq: Every day | SUBCUTANEOUS | Status: DC
  Administered 2018-07-28 – 2018-08-05 (×9): 28 [IU] via SUBCUTANEOUS
  Filled 2018-07-28 (×13): qty 0.28

## 2018-07-28 MED ORDER — LISINOPRIL 20 MG PO TABS
20.0000 mg | ORAL_TABLET | Freq: Every day | ORAL | Status: DC
  Administered 2018-07-28 – 2018-08-06 (×10): 20 mg via ORAL
  Filled 2018-07-28 (×4): qty 1
  Filled 2018-07-28: qty 2
  Filled 2018-07-28 (×5): qty 1

## 2018-07-28 MED ORDER — DIVALPROEX SODIUM 500 MG PO DR TAB
500.0000 mg | DELAYED_RELEASE_TABLET | Freq: Two times a day (BID) | ORAL | Status: DC
  Administered 2018-07-28 – 2018-08-06 (×18): 500 mg via ORAL
  Filled 2018-07-28 (×18): qty 1

## 2018-07-28 MED ORDER — LACTULOSE 10 GM/15ML PO SOLN
10.0000 g | Freq: Two times a day (BID) | ORAL | Status: DC
  Administered 2018-07-28 – 2018-08-06 (×17): 10 g via ORAL
  Filled 2018-07-28 (×22): qty 30

## 2018-07-28 MED ORDER — AMLODIPINE BESYLATE 5 MG PO TABS
5.0000 mg | ORAL_TABLET | Freq: Every day | ORAL | Status: DC
  Administered 2018-07-28 – 2018-08-06 (×10): 5 mg via ORAL
  Filled 2018-07-28 (×10): qty 1

## 2018-07-28 MED ORDER — CLONAZEPAM 1 MG PO TABS
1.0000 mg | ORAL_TABLET | Freq: Four times a day (QID) | ORAL | Status: DC
  Administered 2018-07-28 – 2018-08-06 (×34): 1 mg via ORAL
  Filled 2018-07-28 (×9): qty 1
  Filled 2018-07-28: qty 2
  Filled 2018-07-28 (×24): qty 1

## 2018-07-28 MED ORDER — LORAZEPAM 2 MG PO TABS
2.0000 mg | ORAL_TABLET | Freq: Once | ORAL | Status: AC
  Administered 2018-07-28: 2 mg via ORAL
  Filled 2018-07-28: qty 1

## 2018-07-28 MED ORDER — INSULIN ASPART 100 UNIT/ML ~~LOC~~ SOLN: 0.0000 [IU] | Freq: Three times a day (TID) | SUBCUTANEOUS | Status: DC

## 2018-07-28 NOTE — ED Notes (Signed)
Pt to be discharged home. RN attempted to contact legal guardian Rogelia BogaMichael Hering [phone]).  Left voicemail.    RN called [facility] ([phone]). Staff told this Clinical research associatewriter he could not leave to pick up patient because he was the only staff present.    RN also called group home administrator and left voicemail [phone]).   Charge nurse made aware.
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
## 2018-07-28 NOTE — ED Notes (Signed)
Pt unable to provide urine at this time

## 2018-07-28 NOTE — ED Notes (Signed)
VOL/Consult completed/ Plan to D/C back to [facility]/ Social work consult will be ordered
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
## 2018-07-28 NOTE — ED Notes (Signed)
Pt. Transferred to BHU from ED to room 2 after screening for contraband. Report to include Situation, Background, Assessment and Recommendations from RN. Pt. Oriented to unit including Q15 minute rounds as well as the security cameras for their protection. Patient is alert and oriented, warm and dry in no acute distress. Patient denies SI, HI, and AVH. Pt. Encouraged to let me know if needs arise. 
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
## 2018-07-28 NOTE — ED Notes (Signed)
VOL/Consult pending  

## 2018-07-28 NOTE — ED Notes (Signed)
Hourly rounding reveals patient in day room. No complaints, stable, in no acute distress. Q15 minute rounds and monitoring via Security Cameras to continue. 
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
## 2018-07-28 NOTE — ED Notes (Signed)
Hourly rounding reveals patient in room. No complaints, stable, in no acute distress. Q15 minute rounds and monitoring via Security Cameras to continue. 
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
## 2018-07-29 DIAGNOSIS — F918 Other conduct disorders: Secondary | ICD-10-CM | POA: Diagnosis not present

## 2018-07-29 LAB — GLUCOSE, CAPILLARY
GLUCOSE-CAPILLARY: 86 mg/dL (ref 70–99)
GLUCOSE-CAPILLARY: 119 mg/dL — AB (ref 70–99)
GLUCOSE-CAPILLARY: 152 mg/dL — AB (ref 70–99)
Glucose-Capillary: 88 mg/dL (ref 70–99)

## 2018-07-29 MED ORDER — PERPHENAZINE 4 MG PO TABS
8.0000 mg | ORAL_TABLET | Freq: Three times a day (TID) | ORAL | Status: DC
  Administered 2018-07-29 – 2018-08-06 (×24): 8 mg via ORAL
  Filled 2018-07-29 (×28): qty 2

## 2018-07-29 MED ORDER — INSULIN ASPART 100 UNIT/ML ~~LOC~~ SOLN
0.0000 [IU] | Freq: Three times a day (TID) | SUBCUTANEOUS | Status: DC
  Administered 2018-08-01: 3 [IU] via SUBCUTANEOUS
  Administered 2018-08-03 – 2018-08-05 (×2): 2 [IU] via SUBCUTANEOUS
  Filled 2018-07-29 (×3): qty 1

## 2018-07-29 MED ORDER — BENZTROPINE MESYLATE 1 MG PO TABS
1.0000 mg | ORAL_TABLET | Freq: Two times a day (BID) | ORAL | Status: DC
  Administered 2018-07-29 – 2018-08-01 (×7): 1 mg via ORAL
  Filled 2018-07-29 (×7): qty 1

## 2018-07-29 MED ORDER — QUETIAPINE FUMARATE 100 MG PO TABS
100.0000 mg | ORAL_TABLET | ORAL | Status: DC | PRN
  Administered 2018-07-29 – 2018-08-05 (×9): 100 mg via ORAL
  Filled 2018-07-29: qty 4
  Filled 2018-07-29: qty 1
  Filled 2018-07-29: qty 4
  Filled 2018-07-29: qty 1
  Filled 2018-07-29 (×3): qty 4
  Filled 2018-07-29 (×2): qty 1
  Filled 2018-07-29: qty 4
  Filled 2018-07-29: qty 1
  Filled 2018-07-29: qty 4
  Filled 2018-07-29 (×5): qty 1

## 2018-07-29 MED ORDER — FLUPHENAZINE HCL 5 MG PO TABS
10.0000 mg | ORAL_TABLET | Freq: Three times a day (TID) | ORAL | Status: DC
  Administered 2018-07-29 – 2018-08-06 (×25): 10 mg via ORAL
  Filled 2018-07-29 (×29): qty 2

## 2018-07-29 NOTE — ED Notes (Signed)
Hourly rounding reveals patient in room. No complaints, stable, in no acute distress. Q15 minute rounds and monitoring via Security Cameras to continue. 

## 2018-07-29 NOTE — ED Notes (Signed)
Pt. Requesting help with higene after urinating on his cloths. Assisted patient dressing in clean scrubs after washing.  
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
## 2018-07-29 NOTE — ED Notes (Signed)
VOL/Pending Admit to BMU 

## 2018-07-29 NOTE — ED Notes (Signed)
Hourly rounding reveals patient sleeping in room. No complaints, stable, in no acute distress. Q15 minute rounds and monitoring via Security Cameras to continue. 

## 2018-07-29 NOTE — ED Notes (Signed)
Pt. Requesting help with higene after urinating on his cloths. Assisted patient dressing in clean scrubs after washing.

## 2018-07-29 NOTE — ED Notes (Signed)
Report to include Situation, Background, Assessment, and Recommendations received from Dorothy RN. Patient alert and oriented, warm and dry, in no acute distress. Patient denies SI, HI, AVH and pain. Patient made aware of Q15 minute rounds and security cameras for their safety. Patient instructed to come to me with needs or concerns.  
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
## 2018-07-29 NOTE — ED Notes (Signed)
Pt labile, will curse out staff then come back and apologize. Pt states "you can't tell me what to do" then approach staff to apologize. Multiple redirections as he keeps coming to nurses station to ask a question but then can't remember or seem to find the words.

## 2018-07-29 NOTE — ED Notes (Signed)
Hourly rounding reveals patient in rest room. No complaints, stable, in no acute distress. Q15 minute rounds and monitoring via Security Cameras to continue. 

## 2018-07-29 NOTE — ED Provider Notes (Signed)
-----------------------------------------   6:20 AM on 07/29/2018 -----------------------------------------   Blood pressure 127/84, pulse 92, temperature 98.3 F (36.8 C), resp. rate 20, height 6\' 6"  (1.981 m), weight 106.6 kg, SpO2 99 %.  The patient had no acute events since last update.  Calm and cooperative at this time.  Disposition is pending Psychiatry/Behavioral Medicine team recommendations.     Merrily Brittleifenbark, Rivan Siordia, MD 07/29/18 [phone]

## 2018-07-30 DIAGNOSIS — F918 Other conduct disorders: Secondary | ICD-10-CM | POA: Diagnosis not present

## 2018-07-30 LAB — GLUCOSE, CAPILLARY
GLUCOSE-CAPILLARY: 89 mg/dL (ref 70–99)
GLUCOSE-CAPILLARY: 122 mg/dL — AB (ref 70–99)
GLUCOSE-CAPILLARY: 131 mg/dL — AB (ref 70–99)
Glucose-Capillary: 84 mg/dL (ref 70–99)
Glucose-Capillary: 116 mg/dL — ABNORMAL HIGH (ref 70–99)

## 2018-07-30 NOTE — ED Notes (Signed)
Hourly rounding reveals patient in room. No complaints, stable, in no acute distress. Q15 minute rounds and monitoring via Security Cameras to continue. 

## 2018-07-30 NOTE — BH Assessment (Signed)
Referral information for Psychiatric Hospitalization faxed to;   [facility] Kitchen. [facility]via GroveBrynn Marr [phone]),   . [facility] [phone]),  . [facility] [phone]  . [facility] [phone]),   . [facility] [phone]),   . [facility] ([phone]), Pending review, requesting a full psychological with IQ scores.  [facility] Coder. [facility] [phone]),   . [facility] [phone])  . [facility] [phone]).

## 2018-07-30 NOTE — ED Notes (Signed)
Hourly rounding reveals patient sleeping in room. No complaints, stable, in no acute distress. Q15 minute rounds and monitoring via Security Cameras to continue. 

## 2018-07-30 NOTE — ED Notes (Signed)
Pt took a shower. Sheet changed on bed.

## 2018-07-30 NOTE — ED Notes (Signed)
Pt BG 122 @ 2130. Hold 2 units of insulin per MD order.

## 2018-07-30 NOTE — ED Provider Notes (Signed)
-----------------------------------------   6:30 AM on 07/30/2018 -----------------------------------------   Blood pressure 119/75, pulse 88, temperature 98.4 F (36.9 C), temperature source Oral, resp. rate 16, height 6\' 6"  (1.981 m), weight 106.6 kg, SpO2 100 %.  The patient had no acute events since last update.  Calm and cooperative at this time.  Disposition is pending Psychiatry/Behavioral Medicine team recommendations.     Irean HongSung, Gunhild Bautch J, MD 07/30/18 0630

## 2018-07-30 NOTE — ED Notes (Signed)
Report to include Situation, Background, Assessment, and Recommendations received from RN Wendy. Patient alert and oriented, warm and dry, in no acute distress. Patient denies SI, HI, AVH and pain. Patient made aware of Q15 minute rounds and security cameras for their safety. Patient instructed to come to me with needs or concerns.  
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
## 2018-07-30 NOTE — ED Notes (Signed)
Hourly rounding reveals patient up to  rest room with assistance. No complaints, stable, in no acute distress. Q15 minute rounds and monitoring via [employer] to continue.

## 2018-07-30 NOTE — ED Notes (Signed)
Pt C/o dizziness and shaking. Notified MD. Recheck BG 131. Administer Scheduled HS 28 units of Insulin detemir (Levemir). Pt tolerated well. No issues or complaints to report
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
## 2018-07-30 NOTE — BH Assessment (Signed)
Writer spoke with [employer] [facility] Team, following their visit. Receive contact information for their main office.   Writer spoke with Environmental managertrategic Administration Staff requested patient's Psychological with IQ.

## 2018-07-30 NOTE — ED Notes (Signed)
Pt CBG performed and pt given breakfast tray.  Pt stated we need to call and get him a second tray.

## 2018-07-30 NOTE — ED Notes (Signed)
Pt given supper tray.

## 2018-07-30 NOTE — ED Notes (Signed)
Pt given lunch tray.

## 2018-07-30 NOTE — ED Notes (Signed)
VOL/Pending Admit to BMU 

## 2018-07-30 NOTE — ED Notes (Signed)
The office for [facility] is [phone], Oscar FortRegina Duke came to visit Patient today, visit when well, no signs of distress, patient  Is aware that he will not be admitted to [facility] at this time, and He states " I'm going to be a experiment here", Patient can be redirected easily at this time, nurse will continue to monitor, q 15 minute checks and camera surveillance in progress for safety.

## 2018-07-30 NOTE — ED Notes (Signed)
Pt speaking with someone names Casimiro NeedleMichael on the phone. [phone]

## 2018-07-31 DIAGNOSIS — F918 Other conduct disorders: Secondary | ICD-10-CM | POA: Diagnosis not present

## 2018-07-31 LAB — GLUCOSE, CAPILLARY
GLUCOSE-CAPILLARY: 78 mg/dL (ref 70–99)
Glucose-Capillary: 104 mg/dL — ABNORMAL HIGH (ref 70–99)
Glucose-Capillary: 92 mg/dL (ref 70–99)

## 2018-07-31 NOTE — ED Notes (Signed)
Pt couldn't sleeping. Administered 100 mg of Seroquel PRN per order. He tolerated well. Pt  Is in room.

## 2018-07-31 NOTE — ED Notes (Signed)
Hourly rounding reveals patient in room sleeping. No complaints, stable, in no acute distress. Q15 minute rounds and monitoring via [employer] Cam[employer] to continue.  

## 2018-07-31 NOTE — ED Notes (Signed)
Patient in dayroom at this time.

## 2018-07-31 NOTE — ED Provider Notes (Signed)
-----------------------------------------   4:56 AM on 07/31/2018 -----------------------------------------   Blood pressure 139/80, pulse 69, temperature 98.3 F (36.8 C), temperature source Oral, resp. rate 16, height 6\' 6"  (1.981 m), weight 106.6 kg, SpO2 96 %.  The patient had no acute events since last update.  Calm and cooperative at this time.  Disposition is pending Psychiatry/Behavioral Medicine team recommendations.     Merrily Brittleifenbark, Mehul Rudin, MD 07/31/18 [phone]
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
## 2018-07-31 NOTE — ED Notes (Signed)
Pt given supper tray and diet sprite
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
## 2018-07-31 NOTE — ED Notes (Signed)
Hourly rounding reveals patient in room. No complaints, stable, in no acute distress. Q15 minute rounds and monitoring via Security Cameras to continue. 

## 2018-07-31 NOTE — ED Notes (Signed)
Hourly rounding reveals patient in bathroom. He has frequent bathroom visits.  No complaints, stable, in no acute distress. Q15 minute rounds and monitoring via [employer] to continue.

## 2018-08-01 DIAGNOSIS — F918 Other conduct disorders: Secondary | ICD-10-CM | POA: Diagnosis not present

## 2018-08-01 LAB — GLUCOSE, CAPILLARY
GLUCOSE-CAPILLARY: 87 mg/dL (ref 70–99)
GLUCOSE-CAPILLARY: 80 mg/dL (ref 70–99)
Glucose-Capillary: 159 mg/dL — ABNORMAL HIGH (ref 70–99)

## 2018-08-01 MED ORDER — BENZTROPINE MESYLATE 1 MG PO TABS
2.0000 mg | ORAL_TABLET | Freq: Two times a day (BID) | ORAL | Status: DC
  Administered 2018-08-01 – 2018-08-06 (×10): 2 mg via ORAL
  Filled 2018-08-01 (×9): qty 2

## 2018-08-01 MED ORDER — IBUPROFEN 600 MG PO TABS
600.0000 mg | ORAL_TABLET | Freq: Four times a day (QID) | ORAL | Status: DC | PRN
  Administered 2018-08-01 – 2018-08-06 (×5): 600 mg via ORAL
  Filled 2018-08-01 (×5): qty 1
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
## 2018-08-01 NOTE — ED Provider Notes (Signed)
-----------------------------------------   6:15 AM on 08/01/2018 -----------------------------------------   Blood pressure 114/67, pulse 95, temperature 98.4 F (36.9 C), temperature source Oral, resp. rate 18, height 6\' 6"  (1.981 m), weight 106.6 kg, SpO2 100 %.  The patient had no acute events since last update.  Calm and cooperative at this time.  Disposition is pending Psychiatry/Behavioral Medicine team recommendations.     Irean HongSung, Dazha Kempa J, MD 08/01/18 [phone]

## 2018-08-02 DIAGNOSIS — F918 Other conduct disorders: Secondary | ICD-10-CM | POA: Diagnosis not present

## 2018-08-02 LAB — GLUCOSE, CAPILLARY
GLUCOSE-CAPILLARY: 67 mg/dL — AB (ref 70–99)
GLUCOSE-CAPILLARY: 107 mg/dL — AB (ref 70–99)
Glucose-Capillary: 86 mg/dL (ref 70–99)
Glucose-Capillary: 99 mg/dL (ref 70–99)
Glucose-Capillary: 109 mg/dL — ABNORMAL HIGH (ref 70–99)

## 2018-08-02 NOTE — ED Notes (Signed)
Patient received PM snack. 

## 2018-08-02 NOTE — ED Notes (Signed)

## 2018-08-02 NOTE — ED Notes (Signed)
voluntary 

## 2018-08-02 NOTE — ED Provider Notes (Signed)
-----------------------------------------   7:07 AM on 08/02/2018 -----------------------------------------   Blood pressure 126/73, pulse 90, temperature (!) 97.4 F (36.3 C), temperature source Oral, resp. rate 16, height 6\' 6"  (1.981 m), weight 106.6 kg, SpO2 100 %.  The patient had no acute events since last update.  Calm and cooperative at this time.  Disposition is pending Psychiatry/Behavioral Medicine team recommendations.     Willy Eddyobinson, Denessa Cavan, MD 08/02/18 [phone]

## 2018-08-02 NOTE — ED Notes (Signed)
PT VOLUNTARY PENDING PLACEMENT. 

## 2018-08-03 DIAGNOSIS — F918 Other conduct disorders: Secondary | ICD-10-CM | POA: Diagnosis not present

## 2018-08-03 LAB — GLUCOSE, CAPILLARY
GLUCOSE-CAPILLARY: 142 mg/dL — AB (ref 70–99)
Glucose-Capillary: 107 mg/dL — ABNORMAL HIGH (ref 70–99)
Glucose-Capillary: 97 mg/dL (ref 70–99)
Glucose-Capillary: 138 mg/dL — ABNORMAL HIGH (ref 70–99)

## 2018-08-03 NOTE — ED Notes (Addendum)
Pt continues to come to nurses station door every couple of minutes, usually requesting snack and drinks. Difficult to re-direct.   Maintained on 15 minute checks and observation by security camera for safety.

## 2018-08-03 NOTE — ED Notes (Signed)
Pt given cup of water 

## 2018-08-03 NOTE — ED Notes (Signed)
PT VOLUNTARY PENDING PLACEMENT. 

## 2018-08-03 NOTE — ED Notes (Signed)
Pt given a sprite 

## 2018-08-03 NOTE — ED Provider Notes (Signed)
-----------------------------------------   6:41 AM on 08/03/2018 -----------------------------------------   Blood pressure 113/71, pulse (!) 109, temperature 98.3 F (36.8 C), temperature source Oral, resp. rate 16, height 6\' 6"  (1.981 m), weight 106.6 kg, SpO2 100 %.  The patient had no acute events since last update.  Calm and cooperative at this time.  Disposition is pending Psychiatry/Behavioral Medicine team recommendations.     Irean HongSung, Jade J, MD 08/03/18 [phone]

## 2018-08-03 NOTE — ED Notes (Signed)
Hourly rounding reveals patient sleeping in room. No complaints, stable, in no acute distress. Q15 minute rounds and monitoring via Security Cameras to continue. 

## 2018-08-03 NOTE — ED Notes (Signed)
Pt given mango italian ice.

## 2018-08-03 NOTE — ED Notes (Signed)
Pt given lunch tray.

## 2018-08-03 NOTE — ED Notes (Signed)
Hourly rounding reveals patient in day room. No complaints, stable, in no acute distress. Q15 minute rounds and monitoring via Security Cameras to continue. 

## 2018-08-03 NOTE — ED Notes (Signed)
Report to include Situation, Background, Assessment, and Recommendations received from Amy B. RN. Patient alert and oriented, warm and dry, in no acute distress. Patient denies SI, HI, AVH and pain. Patient made aware of Q15 minute rounds and security cameras for their safety. Patient instructed to come to me with needs or concerns. 

## 2018-08-03 NOTE — ED Notes (Signed)
Pt given breakfast tray

## 2018-08-03 NOTE — ED Notes (Signed)
Hourly rounding reveals patient in room. No complaints, stable, in no acute distress. Q15 minute rounds and monitoring via Security Cameras to continue. 

## 2018-08-03 NOTE — ED Notes (Signed)
Pt given graham crackers and peanut butter.

## 2018-08-03 NOTE — ED Notes (Addendum)
Pt continuously coming to nurses station door requesting food. Pt has already been given 2 breakfast trays and a snack.    Maintained on 15 minute checks and observation by security camera for safety.

## 2018-08-03 NOTE — ED Notes (Signed)
Pt in shower at this time

## 2018-08-04 DIAGNOSIS — F918 Other conduct disorders: Secondary | ICD-10-CM | POA: Diagnosis not present

## 2018-08-04 LAB — GLUCOSE, CAPILLARY
GLUCOSE-CAPILLARY: 86 mg/dL (ref 70–99)
GLUCOSE-CAPILLARY: 99 mg/dL (ref 70–99)
Glucose-Capillary: 94 mg/dL (ref 70–99)
Glucose-Capillary: 142 mg/dL — ABNORMAL HIGH (ref 70–99)

## 2018-08-04 MED ORDER — DIAZEPAM 5 MG PO TABS
10.0000 mg | ORAL_TABLET | Freq: Once | ORAL | Status: AC
  Administered 2018-08-04: 10 mg via ORAL
  Filled 2018-08-04: qty 2

## 2018-08-04 NOTE — ED Notes (Signed)
Hourly rounding reveals patient in day room despite encouragement to go to his room. No complaints, stable, in no acute distress. Q15 minute rounds and monitoring via [employer] Cam[employer] to continue.

## 2018-08-04 NOTE — ED Notes (Signed)
Hourly rounding reveals patient in room. No complaints, stable, in no acute distress. Q15 minute rounds and monitoring via Security Cameras to continue. 

## 2018-08-04 NOTE — ED Notes (Signed)
VOL  PENDING  PLACEMENT 

## 2018-08-04 NOTE — ED Notes (Signed)
Hourly rounding reveals patient in day room. No complaints, stable, in no acute distress. Q15 minute rounds and monitoring via Security Cameras to continue. 

## 2018-08-04 NOTE — ED Notes (Signed)
Hourly rounding reveals patient sleeping in room. No complaints, stable, in no acute distress. Q15 minute rounds and monitoring via Security Cameras to continue. 

## 2018-08-04 NOTE — ED Notes (Signed)
Report to include Situation, Background, Assessment, and Recommendations received from Amy B. RN. Patient alert and oriented, warm and dry, in no acute distress. Patient denies SI, HI, AVH and pain. Patient made aware of Q15 minute rounds and security cameras for their safety. Patient instructed to come to me with needs or concerns. 

## 2018-08-04 NOTE — ED Provider Notes (Signed)
Vitals:   08/03/18 1935 08/03/18 2100  BP: 133/74   Pulse: (!) 115   Resp: 18   Temp:  97.6 F (36.4 C)  SpO2: 100%    No events overnight, patient remains medically stable.   Emily FilbertWilliams, Willis Kuipers E, MD 08/04/18 [phone]

## 2018-08-04 NOTE — ED Notes (Addendum)
Hourly rounding reveals patient in room. No complaints, stable, in no acute distress. Q15 minute rounds and monitoring via Security Cameras to continue. 

## 2018-08-04 NOTE — ED Notes (Signed)
Hourly rounding reveals patient in rest room. No complaints, stable, in no acute distress. Q15 minute rounds and monitoring via Security Cameras to continue. 

## 2018-08-04 NOTE — ED Notes (Signed)
Pt. C/o anxiety. 

## 2018-08-04 NOTE — ED Notes (Signed)
Hourly rounding reveals patient in day room despite redirection to his room. No complaints, stable, in no acute distress. Q15 minute rounds and monitoring via Security Cameras to continue. 

## 2018-08-04 NOTE — ED Notes (Signed)
Hourly rounding reveals patient in room. Stable, in no acute distress. Q15 minute rounds and monitoring via Security Cameras to continue. 

## 2018-08-04 NOTE — ED Notes (Signed)
Pt given lunch tray.

## 2018-08-04 NOTE — ED Notes (Signed)
Pt continues to come to nurses station door repeatedly.  Difficult to re-direct and cursing at staff.   Maintained on 15 minute checks and observation by security camera for safety.

## 2018-08-05 DIAGNOSIS — F918 Other conduct disorders: Secondary | ICD-10-CM | POA: Diagnosis not present

## 2018-08-05 LAB — GLUCOSE, CAPILLARY
GLUCOSE-CAPILLARY: 131 mg/dL — AB (ref 70–99)
GLUCOSE-CAPILLARY: 118 mg/dL — AB (ref 70–99)
Glucose-Capillary: 79 mg/dL (ref 70–99)

## 2018-08-05 MED ORDER — LORAZEPAM 2 MG/ML IJ SOLN
2.0000 mg | Freq: Once | INTRAMUSCULAR | Status: AC
  Administered 2018-08-05: 2 mg via INTRAVENOUS
  Filled 2018-08-05: qty 1

## 2018-08-05 MED ORDER — DIPHENHYDRAMINE HCL 50 MG/ML IJ SOLN
50.0000 mg | Freq: Once | INTRAMUSCULAR | Status: AC
  Administered 2018-08-05: 50 mg via INTRAVENOUS
  Filled 2018-08-05: qty 1

## 2018-08-05 MED ORDER — ZIPRASIDONE MESYLATE 20 MG IM SOLR
20.0000 mg | Freq: Once | INTRAMUSCULAR | Status: AC
  Administered 2018-08-05: 20 mg via INTRAMUSCULAR
  Filled 2018-08-05: qty 20

## 2018-08-05 MED ORDER — BENZTROPINE MESYLATE 1 MG PO TABS
ORAL_TABLET | ORAL | Status: AC
  Administered 2018-08-05: 2 mg via ORAL
  Filled 2018-08-05: qty 1

## 2018-08-05 NOTE — ED Notes (Signed)
Hourly rounding reveals patient sleeping in room. No complaints, stable, in no acute distress. Q15 minute rounds and monitoring via Security Cameras to continue. 

## 2018-08-05 NOTE — ED Notes (Signed)
Pt sleeping. Lunch tray held in RN station

## 2018-08-05 NOTE — ED Notes (Signed)
No report given to this tech on arrival at 11am 

## 2018-08-05 NOTE — ED Notes (Signed)
Oscar HaysRegina Duke [phone]) called to do a follow up on patients placement

## 2018-08-05 NOTE — ED Notes (Signed)
VOL  PENDING  PLACEMENT 

## 2018-08-05 NOTE — ED Notes (Signed)
Hourly rounding reveals patient in room. No complaints, stable, in no acute distress. Q15 minute rounds and monitoring via Security Cameras to continue. 

## 2018-08-05 NOTE — ED Notes (Signed)
Patient continues to knock on door of nurses station despite instructions to go to his room.

## 2018-08-05 NOTE — ED Notes (Signed)
Hourly rounding reveals patient in day room. Redirection regarding his multiple trips to the nurses station minimally effective. Stable, in no acute distress. Q15 minute rounds and monitoring via [employer] to continue.

## 2018-08-05 NOTE — ED Notes (Signed)
Hourly rounding reveals patient in rest room. No complaints, stable, in no acute distress. Q15 minute rounds and monitoring via Security Cameras to continue. 

## 2018-08-05 NOTE — Progress Notes (Signed)
Clinical Child psychotherapistocial Worker (CSW) attempted to contact patient's group home owner [name] to see if she would consider taking patient back however she did not answer and a voicemail was left.   Baker Hughes IncorporatedBailey Hinata Diener, LCSW [phone]

## 2018-08-05 NOTE — Progress Notes (Signed)
Clinical Child psychotherapistocial Worker (CSW) received a call back from patient's group home owner Pepco HoldingsLawanda Ray. CSW made Lawanda aware that patient has been cleared by psych. Per Rowan BlaseLawanda she will come visit patient tomorrow around 12 noon to see if she can accept him back in the group home. Per Rowan BlaseLawanda patient does better when he is on klonopin 4 times per day instead of 3 times per day. CSW will continue to follow and assist as needed.   Baker Hughes IncorporatedBailey Cleofas Hudgins, LCSW [phone]

## 2018-08-05 NOTE — ED Notes (Signed)
Patient continues to come to the nurses station constantly, patient needs frequent redirection about coming to the nurses station. Patient was given Ativan, Geodon, and benadryl at 0500 by night nurse, patient will not stay in his room and rest, continues to be unsteady on his feet and frequent redicrection

## 2018-08-05 NOTE — ED Notes (Signed)
Patient continues to come to the nurses station constantly, patient needs frequent redirection about coming to the nurses station.

## 2018-08-06 DIAGNOSIS — F918 Other conduct disorders: Secondary | ICD-10-CM | POA: Diagnosis not present

## 2018-08-06 LAB — GLUCOSE, CAPILLARY
Glucose-Capillary: 99 mg/dL (ref 70–99)
Glucose-Capillary: 86 mg/dL (ref 70–99)

## 2018-08-06 MED ORDER — CLONAZEPAM 1 MG PO TABS
2.0000 mg | ORAL_TABLET | Freq: Once | ORAL | Status: AC
  Administered 2018-08-06: 2 mg via ORAL
  Filled 2018-08-06: qty 2

## 2018-08-06 MED ORDER — INSULIN ASPART 100 UNIT/ML ~~LOC~~ SOLN: 0.0000 [IU] | Freq: Three times a day (TID) | SUBCUTANEOUS | 11 refills | Status: DC

## 2018-08-06 MED ORDER — INSULIN DETEMIR 100 UNIT/ML ~~LOC~~ SOLN: 28.0000 [IU] | Freq: Every day | SUBCUTANEOUS | 11 refills | Status: DC

## 2018-08-06 MED ORDER — LACTULOSE 10 GM/15ML PO SOLN: 10.0000 g | Freq: Two times a day (BID) | ORAL | 0 refills | Status: AC

## 2018-08-06 MED ORDER — SODIUM CHLORIDE 1 G PO TABS: 1.0000 g | ORAL_TABLET | Freq: Every day | ORAL | 0 refills | Status: AC

## 2018-08-06 MED ORDER — AMLODIPINE BESYLATE 5 MG PO TABS: 5.0000 mg | ORAL_TABLET | Freq: Every day | ORAL | 0 refills | Status: DC

## 2018-08-06 MED ORDER — LEVETIRACETAM ER 750 MG PO TB24: 1500.0000 mg | ORAL_TABLET | Freq: Every day | ORAL | 0 refills | Status: DC

## 2018-08-06 MED ORDER — IBUPROFEN 600 MG PO TABS: 600.0000 mg | ORAL_TABLET | Freq: Four times a day (QID) | ORAL | 0 refills | Status: DC | PRN

## 2018-08-06 MED ORDER — LISINOPRIL 20 MG PO TABS: 20.0000 mg | ORAL_TABLET | Freq: Every day | ORAL | 0 refills | Status: DC

## 2018-08-06 MED ORDER — BENZTROPINE MESYLATE 2 MG PO TABS: 2.0000 mg | ORAL_TABLET | Freq: Two times a day (BID) | ORAL | 0 refills | Status: AC

## 2018-08-06 MED ORDER — PERPHENAZINE 8 MG PO TABS: 8.0000 mg | ORAL_TABLET | Freq: Three times a day (TID) | ORAL | 0 refills | Status: DC

## 2018-08-06 MED ORDER — FLUPHENAZINE HCL 10 MG PO TABS: 10.0000 mg | ORAL_TABLET | Freq: Three times a day (TID) | ORAL | 0 refills | Status: DC

## 2018-08-06 MED ORDER — QUETIAPINE FUMARATE 100 MG PO TABS: 100.0000 mg | ORAL_TABLET | ORAL | 0 refills | Status: DC | PRN

## 2018-08-06 MED ORDER — PANTOPRAZOLE SODIUM 40 MG PO TBEC: 40.0000 mg | DELAYED_RELEASE_TABLET | Freq: Every day | ORAL | 0 refills | Status: AC

## 2018-08-06 MED ORDER — INSULIN DETEMIR 100 UNIT/ML ~~LOC~~ SOLN: 28.0000 [IU] | Freq: Every day | SUBCUTANEOUS | 11 refills | Status: AC

## 2018-08-06 MED ORDER — DIVALPROEX SODIUM 500 MG PO DR TAB: 500.0000 mg | DELAYED_RELEASE_TABLET | Freq: Two times a day (BID) | ORAL | 0 refills | Status: DC

## 2018-08-06 MED ORDER — CLONAZEPAM 1 MG PO TABS: 1.0000 mg | ORAL_TABLET | Freq: Four times a day (QID) | ORAL | 0 refills | Status: DC

## 2018-08-06 NOTE — Progress Notes (Signed)
LCSW met with Oscar Duke and reported that Oscar Duke will be coming at noon to assess. LCSW met with patient and explained Oscar Duke is coming out to meet with him today. He reports he cant sleep but will have a shower he doesn't want anyone to think he is stinky. He reports" he knows he cant get angry at the [facility] staff anymore because next time he will go to jail" LCSW encouraged kindness always is a safe bet. He agreed.   [employer] LCSW [phone]

## 2018-08-06 NOTE — Discharge Summary (Addendum)
  Psychiatry: Medications for Gari CrownMark A Vangilder, date of birth 1971/05/21, as of his discharge from the hospital emergency room on August 06, 2018:  Amlodipine 5 mg 1 tablet daily by mouth Lisinopril 20 mg 1 tablet by mouth daily Fluphenazine 10 mg 1 tablet by mouth 3 times daily Perphenazine 8 mg 1 tablet by mouth 3 times daily Quetiapine 100 mg 1 tablet by mouth every 4 hours as needed for anxiety Lactulose 10 g per 15 mL solution, take 15 mL by mouth 2 times daily Pantoprazole 40 mg 1 tablet by mouth daily Benztropine 2 mg 1 tablet by mouth 2 times daily  clonazepam 1 mg 1 tablet by mouth 4 times daily Divalproex 500 mg 1 tablet by mouth every 12 hours Levetiracetam 750 mg tablets, take 2 tablets by mouth daily Sodium chloride 1 g, take 1 tablet by mouth daily Ibuprofen 600 mg, 1 tablet by mouth every 6 hours as needed for headache or moderate pain Insulin detemir 100 unit/mL solution, inject 0.28 mL (28 units) into the skin at bedtime Helaine ChessJohn Terry Clapacs, MD

## 2018-08-06 NOTE — ED Notes (Signed)
Group home manager here and act team nurse to talk with Patient and they have agreed for him to go back to group home, a secure dwelling, group home manager did let him know that they would not tolerate any violent behaviors. Dr. Toni Amendlapacs is aware and writing prescriptions for Patient, awaiting discharge instructions.

## 2018-08-06 NOTE — ED Provider Notes (Signed)
-----------------------------------------   3:26 AM on 08/06/2018 -----------------------------------------   Blood pressure 124/80, pulse 98, temperature 97.8 F (36.6 C), temperature source Oral, resp. rate 18, height 1.981 m (6\' 6" ), weight 106.6 kg, SpO2 100 %.  I was alerted by the [facility] nurse that the patient had an intentional fall near the bathroom but because he was not getting attention.  He is not reporting any pain or injury and he ambulated back to his room without any difficulty.    Loleta Rose, MD 08/06/18 [phone]

## 2018-08-06 NOTE — Consult Note (Signed)
[facility] Face-to-Face Psychiatry Consult   Reason for Consult: Follow-up consult for this 47 year old man with schizoaffective disorder and history of head injury Referring Physician: Mayford Knife Patient Identification: Oscar Duke MRN:  [id number] Principal Diagnosis: Schizoaffective disorder, bipolar type [facility]) Diagnosis:   Patient Active Problem List   Diagnosis Date Noted  . 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 Seizure (HCC) [R56.9] 07/21/2018  . GERD (gastroesophageal reflux disease) [K21.9] 07/20/2018  . Increased ammonia level [R79.89] 07/09/2018  . Hyponatremia [E87.1] 05/28/2018  . Schizoaffective disorder, bipolar type (HCC) [F25.0] 05/14/2018  . Somnolence [R40.0] 10/05/2017  . Adverse drug interaction with prescription medication [T50.905A] 10/05/2017  . Hypertension [I10]   . Diabetes mellitus without complication (HCC) [E11.9]   . Bipolar 1 disorder (HCC) [F31.9]   . Seizures (HCC) [R56.9]   . Schizophrenia, acute (HCC) [F23] 05/14/2017    Total Time spent with patient: 30 minutes  Subjective:   Oscar Duke is a 47 y.o. male patient admitted with "I am ready to go".  HPI: Patient seen chart reviewed.  This 47 year old man has been in our emergency room for many days now awaiting some kind of disposition.  Because of his history of traumatic brain injury and the fact that he really does not respond well to treatment on the inpatient psychiatric ward we have not felt that he was appropriate for admission.  Nevertheless the patient's behavior had been such that for much of the time we could not contemplate discharge.  Gradually however his behavior has improved in a contained setting.  He is not currently expressing any suicidal or homicidal thoughts.  He is not behaving in a threatening manner.  He has been cooperative with medication.  Social work arranged for representatives from his previous group home to reevaluate him and they are in agreement that he can  return home today on his current medications.  Past Psychiatric History: Long history of mental health and behavior problems.  Difficulty maintaining placement because of behavior  Risk to Self: Suicidal Ideation: No Suicidal Intent: No Is patient at risk for suicide?: No Suicidal Plan?: No Access to Means: No What has been your use of drugs/alcohol within the last 12 months?: denies How many times?: 0 Triggers for Past Attempts: None known Intentional Self Injurious Behavior: None Risk to Others: Homicidal Ideation: No Thoughts of Harm to Others: No Current Homicidal Intent: No Current Homicidal Plan: No Access to Homicidal Means: No History of harm to others?: Yes Assessment of Violence: None Noted Violent Behavior Description: Fights other group home residents Does patient have access to weapons?: No Criminal Charges Pending?: No Does patient have a court date: No Prior Inpatient Therapy: Prior Inpatient Therapy: Yes Prior Therapy Facilty/Provider(s): [facility] Prior Outpatient Therapy:    Past Medical History:  Past Medical History:  Diagnosis Date  . Bipolar 1 disorder (HCC)   . Diabetes mellitus without complication (HCC)   . Hyperlipidemia   . Hypertension   . 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 Schizophrenia, acute (HCC)   . Seizures (HCC)     Past Surgical History:  Procedure Laterality Date  . JOINT REPLACEMENT     Family History: No family history on file. Family Psychiatric  History: See previous note Social History:  Social History   Substance and Sexual Activity  Alcohol Use No     Social History   Substance and Sexual Activity  Drug Use Yes  . Types: Marijuana   Comment: quit 5 years  ago    Social History   Socioeconomic History  . Marital status: Single    Spouse name: Not on file  . Number of children: Not on file  . Years of education: Not on file  . Highest education level: Not on file  Occupational History  .  Not on file  Social Needs  . Financial resource strain: Not on file  . Food insecurity:    Worry: Not on file    Inability: Not on file  . Transportation needs:    Medical: Not on file    Non-medical: Not on file  Tobacco Use  . Smoking status: Never Smoker  . Smokeless tobacco: Never Used  Substance and Sexual Activity  . Alcohol use: No  . Drug use: Yes    Types: Marijuana    Comment: quit 5 years ago  . Sexual activity: Not on file  Lifestyle  . Physical activity:    Days per week: Not on file    Minutes per session: Not on file  . Stress: Not on file  Relationships  . Social connections:    Talks on phone: Not on file    Gets together: Not on file    Attends religious service: Not on file    Active member of club or organization: Not on file    Attends meetings of clubs or organizations: Not on file    Relationship status: Not on file  Other Topics Concern  . Not on file  Social History Narrative  . Not on file   Additional Social History:    Allergies:   Allergies  Allergen Reactions  . Haldol [Haloperidol Lactate]   . Inderal [Propranolol]   . Januvia [Sitagliptin] Other (See Comments)    Skin discoloration on lower legs  . Lithium Other (See Comments)    Seizures    Labs:  Results for orders placed or performed during the hospital encounter of 07/28/18 (from the past 48 hour(s))  Glucose, capillary     Status: None   Collection Time: 08/04/18  4:46 PM  Result Value Ref Range   Glucose-Capillary 94 70 - 99 mg/dL   Comment 1 Notify RN    Comment 2 Document in Chart   Glucose, capillary     Status: Abnormal   Collection Time: 08/04/18  8:50 PM  Result Value Ref Range   Glucose-Capillary 142 (H) 70 - 99 mg/dL  Glucose, capillary     Status: None   Collection Time: 08/05/18  7:48 AM  Result Value Ref Range   Glucose-Capillary 79 70 - 99 mg/dL  Glucose, capillary     Status: Abnormal   Collection Time: 08/05/18 12:03 PM  Result Value Ref Range    Glucose-Capillary 131 (H) 70 - 99 mg/dL  Glucose, capillary     Status: Abnormal   Collection Time: 08/05/18  4:54 PM  Result Value Ref Range   Glucose-Capillary 118 (H) 70 - 99 mg/dL  Glucose, capillary     Status: None   Collection Time: 08/06/18  6:16 AM  Result Value Ref Range   Glucose-Capillary 99 70 - 99 mg/dL  Glucose, capillary     Status: None   Collection Time: 08/06/18 11:34 AM  Result Value Ref Range   Glucose-Capillary 86 70 - 99 mg/dL    Current Facility-Administered Medications  Medication Dose Route Frequency Provider Last Rate Last Dose  . amLODipine (NORVASC) tablet 5 mg  5 mg Oral Daily Jeanmarie Plant, MD   5  mg at 08/06/18 0952  . benztropine (COGENTIN) tablet 2 mg  2 mg Oral BID Celes Dedic, Jackquline Denmark, MD   2 mg at 08/06/18 0952  . clonazePAM (KLONOPIN) tablet 1 mg  1 mg Oral QID Jeanmarie Plant, MD   1 mg at 08/06/18 0953  . divalproex (DEPAKOTE) DR tablet 500 mg  500 mg Oral Q12H Jeanmarie Plant, MD   500 mg at 08/06/18 0953  . fluPHENAZine (PROLIXIN) tablet 10 mg  10 mg Oral TID Taygan Connell, Jackquline Denmark, MD   10 mg at 08/06/18 0955  . ibuprofen (ADVIL,MOTRIN) tablet 600 mg  600 mg Oral Q6H PRN Linh Hedberg, Jackquline Denmark, MD   600 mg at 08/06/18 0506  . insulin aspart (novoLOG) injection 0-15 Units  0-15 Units Subcutaneous TID WC Governor Rooks, MD   2 Units at 08/05/18 1215  . insulin detemir (LEVEMIR) injection 28 Units  28 Units Subcutaneous QHS Jeanmarie Plant, MD   28 Units at 08/05/18 2142  . lactulose (CHRONULAC) 10 GM/15ML solution 10 g  10 g Oral BID Jeanmarie Plant, MD   10 g at 08/06/18 0954  . levETIRAcetam (KEPPRA XR) 24 hr tablet 1,500 mg  1,500 mg Oral Daily Jeanmarie Plant, MD   1,500 mg at 08/06/18 0955  . lisinopril (PRINIVIL,ZESTRIL) tablet 20 mg  20 mg Oral Daily Jeanmarie Plant, MD   20 mg at 08/06/18 1000  . perphenazine (TRILAFON) tablet 8 mg  8 mg Oral TID Luke Rigsbee, Jackquline Denmark, MD   8 mg at 08/06/18 0954  . QUEtiapine (SEROQUEL) tablet 100 mg  100 mg Oral Q4H PRN  Helina Hullum, Jackquline Denmark, MD   100 mg at 08/05/18 0926  . sodium chloride tablet 1 g  1 g Oral Daily Jeanmarie Plant, MD   1 g at 08/06/18 1610   Current Outpatient Medications  Medication Sig Dispense Refill  . amLODipine (NORVASC) 5 MG tablet Take 1 tablet (5 mg total) by mouth daily. 30 tablet 0  . benztropine (COGENTIN) 2 MG tablet Take 1 tablet (2 mg total) by mouth 2 (two) times daily. 60 tablet 0  . clonazePAM (KLONOPIN) 1 MG tablet Take 1 tablet (1 mg total) by mouth 4 (four) times daily. 120 tablet 0  . divalproex (DEPAKOTE) 500 MG DR tablet Take 1 tablet (500 mg total) by mouth every 12 (twelve) hours. 60 tablet 0  . fluPHENAZine (PROLIXIN) 10 MG tablet Take 1 tablet (10 mg total) by mouth 3 (three) times daily. 90 tablet 0  . ibuprofen (ADVIL,MOTRIN) 600 MG tablet Take 1 tablet (600 mg total) by mouth every 6 (six) hours as needed for headache or moderate pain. 30 tablet 0  . insulin detemir (LEVEMIR) 100 UNIT/ML injection Inject 0.28 mLs (28 Units total) into the skin at bedtime. 10 mL 11  . lactulose (CHRONULAC) 10 GM/15ML solution Take 15 mLs (10 g total) by mouth 2 (two) times daily. 600 mL 0  . Levetiracetam 750 MG TB24 Take 2 tablets (1,500 mg total) by mouth daily. 60 tablet 0  . lisinopril (PRINIVIL,ZESTRIL) 20 MG tablet Take 1 tablet (20 mg total) by mouth daily. 30 tablet 0  . pantoprazole (PROTONIX) 40 MG tablet Take 1 tablet (40 mg total) by mouth daily. 30 tablet 0  . perphenazine (TRILAFON) 8 MG tablet Take 1 tablet (8 mg total) by mouth 3 (three) times daily. 90 tablet 0  . QUEtiapine (SEROQUEL) 100 MG tablet Take 1 tablet (100 mg total) by mouth every 4 (  four) hours as needed (anxiety). 60 tablet 0  . sodium chloride 1 g tablet Take 1 tablet (1 g total) by mouth daily. 30 tablet 0    Musculoskeletal: Strength & Muscle Tone: within normal limits Gait & Station: normal Patient leans: N/A  Psychiatric Specialty Exam: Physical Exam  Nursing note and vitals  reviewed. Constitutional: He appears well-developed and well-nourished.  HENT:  Head: Normocephalic and atraumatic.  Eyes: Pupils are equal, round, and reactive to light. Conjunctivae are normal.  Neck: Normal range of motion.  Cardiovascular: Regular rhythm and normal heart sounds.  Respiratory: Effort normal. No respiratory distress.  GI: Soft.  Musculoskeletal: Normal range of motion.  Neurological: He is alert.  Skin: Skin is warm and dry.  Psychiatric: His affect is blunt. His speech is delayed. He is slowed. Cognition and memory are impaired. He expresses impulsivity. He expresses no homicidal and no suicidal ideation.    Review of Systems  Constitutional: Negative.   HENT: Negative.   Eyes: Negative.   Respiratory: Negative.   Cardiovascular: Negative.   Gastrointestinal: Negative.   Musculoskeletal: Negative.   Skin: Negative.   Neurological: Negative.   Psychiatric/Behavioral: Negative.     Blood pressure (!) 143/96, pulse 94, temperature 98 F (36.7 C), temperature source Oral, resp. rate 18, height 6\' 6"  (1.981 m), weight 106.6 kg, SpO2 100 %.Body mass index is 27.16 kg/m.  General Appearance: Disheveled  Eye Contact:  Good  Speech:  Slow  Volume:  Decreased  Mood:  Euthymic  Affect:  Constricted  Thought Process:  Coherent  Orientation:  Full (Time, Place, and Person)  Thought Content:  Rumination and Tangential  Suicidal Thoughts:  No  Homicidal Thoughts:  No  Memory:  Immediate;   Fair Recent;   Poor Remote;   Fair  Judgement:  Impaired  Insight:  Shallow  Psychomotor Activity:  Restlessness  Concentration:  Concentration: Poor  Recall:  [employer]  Fund of Knowledge:  Fair  Language:  Fair  Akathisia:  No  Handed:  Right  AIMS (if indicated):     Assets:  Desire for Improvement Housing Resilience Social Support  ADL's:  Impaired  Cognition:  Impaired,  Mild and Moderate  Sleep:        Treatment Plan Summary: Plan Patient was schizoaffective  disorder also history of head injury with chronic cognitive impairment also chronic behavior problems including wandering behaviors and chronic water intoxication and also history of diabetes and high blood pressure.  His behavior has been stable in the emergency room.  He is not reporting suicidal or homicidal ideation.  Although at times he will still make odd statements he is not grossly bizarre.  He is able to engage in a conversation and express at least a basic understanding that he needs to be cooperative at the group home in order to maintain his status outside the hospital.  Case reviewed with emergency room doctor.  Patient can be discharged from the emergency room.  His blood sugars have been quite stable recently and I will not continue the sliding scale at this time but discharge him just on the standing insulin and his multiple other medicines.  Prescriptions written.  Follow-up in the community as previously.  Disposition: No evidence of imminent risk to self or others at present.   Patient does not meet criteria for psychiatric inpatient admission. Supportive therapy provided about ongoing stressors. Discussed crisis plan, support from social network, calling 911, coming to the Emergency Department, and calling Suicide Hotline.  Mordecai Rasmussen, MD 08/06/2018 1:37 PM

## 2018-08-06 NOTE — ED Notes (Signed)
Patient ate 100% of lunch tray, and beverage, no signs of distress, nurse will continue to monitor.

## 2018-08-06 NOTE — ED Notes (Signed)
Patient's caregiver, manager of group home here to transport back to group home, she voices understanding of discharge instructions, all prescriptions given to caregiver, Patient 's belongings given to patient, no signs of distress, Patient is smiling and friendly .

## 2018-08-06 NOTE — ED Notes (Signed)
Patient denies si/hi or avh, he is very talkative, talked on the phone for about 20 minutes, will continue to monitor, q 15 minute checks and camera surveillance in progress for safety.

## 2018-08-06 NOTE — ED Notes (Signed)
Patient has made multiple visits to nurse's station throughout the night.  Patient is not compliant with limit setting and instead stated, "I'm not listening because I'm hard headed."  Patient has not slept during this shift.

## 2018-08-06 NOTE — ED Notes (Signed)
Patient is taking a shower now, no signs of distress.

## 2018-08-06 NOTE — ED Notes (Addendum)
Patient reporting pain in his foot 5/10 from a "spot" on his foot that he reports was present on admission.  He also states that the pain is worsened because of constant ambulation around the unit.

## 2018-08-06 NOTE — ED Notes (Signed)
Patient is walking around in dayroom, knocks on door often, questions about when He will get his lunch tray, Patient's blood sugar obtained 86, Patient is calm, will continue to monitor, q 15 minute checks and camera surveillance in progress for safety.

## 2018-08-06 NOTE — ED Notes (Signed)
Patient has displayed attention seeking behavior throughout the night.  Staff witnessed patient intentionally fall on bathroom floor and then yelled, "Help!"  Patient then got up and ambulated to his room.  EDP notified.

## 2018-08-06 NOTE — ED Notes (Signed)
Patient ate 100% of breakfast and beverage, he is watching tv, and pacing, comes to window often and will start talking about random topics, he is calm and cooperative, and easily to redirect.

## 2018-08-07 ENCOUNTER — Emergency Department: Payer: Medicare Other

## 2018-08-07 ENCOUNTER — Other Ambulatory Visit: Payer: Self-pay

## 2018-08-07 ENCOUNTER — Encounter: Payer: Self-pay | Admitting: Emergency Medicine

## 2018-08-07 DIAGNOSIS — I10 Essential (primary) hypertension: Secondary | ICD-10-CM | POA: Insufficient documentation

## 2018-08-07 DIAGNOSIS — R079 Chest pain, unspecified: Secondary | ICD-10-CM | POA: Insufficient documentation

## 2018-08-07 DIAGNOSIS — E871 Hypo-osmolality and hyponatremia: Secondary | ICD-10-CM | POA: Insufficient documentation

## 2018-08-07 DIAGNOSIS — E119 Type 2 diabetes mellitus without complications: Secondary | ICD-10-CM | POA: Diagnosis not present

## 2018-08-07 DIAGNOSIS — F250 Schizoaffective disorder, bipolar type: Secondary | ICD-10-CM | POA: Diagnosis not present

## 2018-08-07 DIAGNOSIS — F3132 Bipolar disorder, current episode depressed, moderate: Secondary | ICD-10-CM | POA: Diagnosis not present

## 2018-08-07 LAB — BASIC METABOLIC PANEL
Anion gap: 8 (ref 5–15)
BUN: 12 mg/dL (ref 6–20)
CHLORIDE: 92 mmol/L — AB (ref 98–111)
CO2: 23 mmol/L (ref 22–32)
Calcium: 8.8 mg/dL — ABNORMAL LOW (ref 8.9–10.3)
Creatinine, Ser: 0.78 mg/dL (ref 0.61–1.24)
GFR calc non Af Amer: >60 mL/min (ref >60)
Glucose, Bld: 117 mg/dL — ABNORMAL HIGH (ref 70–99)
POTASSIUM: 3.9 mmol/L (ref 3.5–5.1)
SODIUM: 123 mmol/L — AB (ref 135–145)

## 2018-08-07 LAB — CBC
HEMATOCRIT: 28.9 % — AB (ref 40.0–52.0)
Hemoglobin: 10.6 g/dL — ABNORMAL LOW (ref 13.0–18.0)
MCH: 33 pg (ref 26.0–34.0)
MCHC: 36.8 g/dL — ABNORMAL HIGH (ref 32.0–36.0)
MCV: 89.8 fL (ref 80.0–100.0)
Platelets: 217 10*3/uL (ref 150–440)
RBC: 3.22 MIL/uL — ABNORMAL LOW (ref 4.40–5.90)
RDW: 15.8 % — ABNORMAL HIGH (ref 11.5–14.5)
WBC: 9.7 10*3/uL (ref 3.8–10.6)

## 2018-08-07 LAB — TROPONIN I: Troponin I: <0.03 ng/mL (ref <0.03)

## 2018-08-07 NOTE — ED Notes (Signed)
First Rn note:  Patient came in via [facility] where he left his group home and went walking around for 2 hours and then began knocking on someone's door telling them he had chest pain.  Patients' vital were stable with EMS.

## 2018-08-07 NOTE — ED Triage Notes (Signed)
Pt presents to ED with chest pain from "walking too far". Pt states he came to ED because he has "emotional problems and needs to be admitted to the mental ward because I belong there". Pt states "I'm not crazy. I've just had a very hard life." denies SI or HI. Pt denies hallucinations. Pt states he just doesn't want to be in "[facility] assisted living" anymore. Pt talkative and appears cooperative at this time.

## 2018-08-08 ENCOUNTER — Emergency Department
Admission: EM | Admit: 2018-08-08 | Discharge: 2018-08-08 | Disposition: A | Discharge status: Home / Self Care | Payer: Medicare Other | Attending: Emergency Medicine | Admitting: Emergency Medicine

## 2018-08-08 DIAGNOSIS — F319 Bipolar disorder, unspecified: Secondary | ICD-10-CM | POA: Diagnosis present

## 2018-08-08 DIAGNOSIS — R079 Chest pain, unspecified: Secondary | ICD-10-CM | POA: Diagnosis not present

## 2018-08-08 DIAGNOSIS — F79 Unspecified intellectual disabilities: Secondary | ICD-10-CM

## 2018-08-08 DIAGNOSIS — K219 Gastro-esophageal reflux disease without esophagitis: Secondary | ICD-10-CM | POA: Diagnosis present

## 2018-08-08 DIAGNOSIS — E119 Type 2 diabetes mellitus without complications: Secondary | ICD-10-CM

## 2018-08-08 DIAGNOSIS — F3132 Bipolar disorder, current episode depressed, moderate: Secondary | ICD-10-CM

## 2018-08-08 DIAGNOSIS — G40909 Epilepsy, unspecified, not intractable, without status epilepticus: Secondary | ICD-10-CM

## 2018-08-08 DIAGNOSIS — F250 Schizoaffective disorder, bipolar type: Secondary | ICD-10-CM

## 2018-08-08 DIAGNOSIS — E8779 Other fluid overload: Secondary | ICD-10-CM | POA: Diagnosis present

## 2018-08-08 DIAGNOSIS — E871 Hypo-osmolality and hyponatremia: Secondary | ICD-10-CM

## 2018-08-08 LAB — TROPONIN I: TROPONIN I: 0.03 ng/mL — AB (ref <0.03)

## 2018-08-08 MED ORDER — INSULIN DETEMIR 100 UNIT/ML ~~LOC~~ SOLN
28.0000 [IU] | Freq: Every day | SUBCUTANEOUS | Status: DC
  Filled 2018-08-08: qty 0.28

## 2018-08-08 MED ORDER — LISINOPRIL 10 MG PO TABS
20.0000 mg | ORAL_TABLET | Freq: Every day | ORAL | Status: DC
  Administered 2018-08-08: 20 mg via ORAL
  Filled 2018-08-08: qty 2

## 2018-08-08 MED ORDER — PANTOPRAZOLE SODIUM 40 MG PO TBEC
40.0000 mg | DELAYED_RELEASE_TABLET | Freq: Every day | ORAL | Status: DC
  Administered 2018-08-08: 40 mg via ORAL
  Filled 2018-08-08: qty 1

## 2018-08-08 MED ORDER — PERPHENAZINE 4 MG PO TABS
8.0000 mg | ORAL_TABLET | Freq: Three times a day (TID) | ORAL | Status: DC
  Filled 2018-08-08 (×3): qty 2

## 2018-08-08 MED ORDER — AMLODIPINE BESYLATE 5 MG PO TABS
5.0000 mg | ORAL_TABLET | Freq: Every day | ORAL | Status: DC
  Administered 2018-08-08: 5 mg via ORAL
  Filled 2018-08-08: qty 1

## 2018-08-08 MED ORDER — DIVALPROEX SODIUM 500 MG PO DR TAB
500.0000 mg | DELAYED_RELEASE_TABLET | Freq: Two times a day (BID) | ORAL | Status: DC
  Administered 2018-08-08: 500 mg via ORAL
  Filled 2018-08-08: qty 1

## 2018-08-08 MED ORDER — QUETIAPINE FUMARATE 25 MG PO TABS
100.0000 mg | ORAL_TABLET | ORAL | Status: DC | PRN
  Administered 2018-08-08: 100 mg via ORAL
  Filled 2018-08-08: qty 4

## 2018-08-08 MED ORDER — SODIUM CHLORIDE 0.9 % IV BOLUS
1000.0000 mL | Freq: Once | INTRAVENOUS | Status: AC
  Administered 2018-08-08: 1000 mL via INTRAVENOUS

## 2018-08-08 MED ORDER — CLONAZEPAM 0.5 MG PO TABS
1.0000 mg | ORAL_TABLET | Freq: Four times a day (QID) | ORAL | Status: DC
  Administered 2018-08-08: 1 mg via ORAL
  Filled 2018-08-08: qty 2

## 2018-08-08 MED ORDER — BENZTROPINE MESYLATE 1 MG PO TABS
2.0000 mg | ORAL_TABLET | Freq: Two times a day (BID) | ORAL | Status: DC
  Administered 2018-08-08: 2 mg via ORAL
  Filled 2018-08-08: qty 2

## 2018-08-08 MED ORDER — FLUPHENAZINE HCL 5 MG PO TABS
10.0000 mg | ORAL_TABLET | Freq: Three times a day (TID) | ORAL | Status: DC
  Filled 2018-08-08 (×3): qty 2

## 2018-08-08 MED ORDER — LEVETIRACETAM ER 500 MG PO TB24
1500.0000 mg | ORAL_TABLET | Freq: Every day | ORAL | Status: DC
  Filled 2018-08-08: qty 3

## 2018-08-08 MED ORDER — SODIUM CHLORIDE 1 G PO TABS
1.0000 g | ORAL_TABLET | Freq: Every day | ORAL | Status: DC
  Filled 2018-08-08: qty 1

## 2018-08-08 NOTE — ED Notes (Signed)
Pt reports he was walking today when he developed chest pain and shortness of breath. Pt reports he lives at [facility] assisted living and he does not like it there. Pt reports his chest pain has gone away several hours ago. Pt talking in full and complete sentences with no difficulty at this time.

## 2018-08-08 NOTE — Progress Notes (Signed)
LCSW called Brock Bad and she will pick up patient once he is discharged. LCSW called and left message for [employer] to increase patients community support spoke to [place] team Lead . Awaiting a call back from [place]- peer support worker, to increase patients in home visits Rene Kocher from [employer]- awaiting call back from [place].  [employer] LCSW [phone]

## 2018-08-08 NOTE — ED Notes (Signed)
Patient observed ambulating in lobby in no acute distress.

## 2018-08-08 NOTE — Progress Notes (Signed)
LCSW texted Brock Bad [facility] and she will call this worker back. Messaged her that patient is discharged and ready to be picked up.   [employer] LCSW [phone]

## 2018-08-08 NOTE — ED Provider Notes (Signed)
[facility] Emergency Department Provider Note   ____________________________________________   First MD Initiated Contact with Patient 08/08/18 [phone]     (approximate)  I have reviewed the triage vital signs and the nursing notes.   HISTORY  Chief Complaint Chest Pain    HPI Oscar Duke is a 47 y.o. male who presents to the ED for behavioral medicine evaluation.  Patient was recently discharged from the behavioral health unit and states he walked from his assisted living facility here to be admitted to the mental ward "because I belong there".  States he walked about 3 to 4 miles.  Developed brief chest pain no shortness of breath while walking; none currently.  Denies active SI/HI/AH/VH.  However, endorses depression and asking to speak with psychiatry.  Denies recent fever, chills, abdominal pain, nausea or vomiting.  Denies recent trauma.   Past Medical History:  Diagnosis Date  . Bipolar 1 disorder (HCC)   . Diabetes mellitus without complication (HCC)   . Hyperlipidemia   . Hypertension   . 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 Schizophrenia, acute (HCC)   . Seizures [facility])     Patient Active Problem List   Diagnosis Date Noted  . Water intoxication syndrome 07/24/2018  . Seizure (HCC) 07/21/2018  . GERD (gastroesophageal reflux disease) 07/20/2018  . Increased ammonia level 07/09/2018  . Hyponatremia 05/28/2018  . Schizoaffective disorder, bipolar type (HCC) 05/14/2018  . Somnolence 10/05/2017  . Adverse drug interaction with prescription medication 10/05/2017  . Hypertension   . Diabetes mellitus without complication (HCC)   . Bipolar 1 disorder (HCC)   . Seizures (HCC)   . Schizophrenia, acute (HCC) 05/14/2017    Past Surgical History:  Procedure Laterality Date  . JOINT REPLACEMENT      Prior to Admission medications   Medication Sig Start Date End Date Taking? Authorizing Provider  amLODipine (NORVASC) 5  MG tablet Take 1 tablet (5 mg total) by mouth daily. 08/06/18   Clapacs, Jackquline Denmark, MD  benztropine (COGENTIN) 2 MG tablet Take 1 tablet (2 mg total) by mouth 2 (two) times daily. 08/06/18   Clapacs, Jackquline Denmark, MD  clonazePAM (KLONOPIN) 1 MG tablet Take 1 tablet (1 mg total) by mouth 4 (four) times daily. 08/06/18   Clapacs, Jackquline Denmark, MD  divalproex (DEPAKOTE) 500 MG DR tablet Take 1 tablet (500 mg total) by mouth every 12 (twelve) hours. 08/06/18   Clapacs, Jackquline Denmark, MD  fluPHENAZine (PROLIXIN) 10 MG tablet Take 1 tablet (10 mg total) by mouth 3 (three) times daily. 08/06/18   Clapacs, Jackquline Denmark, MD  ibuprofen (ADVIL,MOTRIN) 600 MG tablet Take 1 tablet (600 mg total) by mouth every 6 (six) hours as needed for headache or moderate pain. 08/06/18   Clapacs, Jackquline Denmark, MD  insulin detemir (LEVEMIR) 100 UNIT/ML injection Inject 0.28 mLs (28 Units total) into the skin at bedtime. 08/06/18   Clapacs, Jackquline Denmark, MD  lactulose (CHRONULAC) 10 GM/15ML solution Take 15 mLs (10 g total) by mouth 2 (two) times daily. 08/06/18   Clapacs, Jackquline Denmark, MD  Levetiracetam 750 MG TB24 Take 2 tablets (1,500 mg total) by mouth daily. 08/06/18 09/05/18  Clapacs, Jackquline Denmark, MD  lisinopril (PRINIVIL,ZESTRIL) 20 MG tablet Take 1 tablet (20 mg total) by mouth daily. 08/06/18   Clapacs, Jackquline Denmark, MD  pantoprazole (PROTONIX) 40 MG tablet Take 1 tablet (40 mg total) by mouth daily. 08/06/18   Clapacs, Jackquline Denmark, MD  perphenazine (TRILAFON)  8 MG tablet Take 1 tablet (8 mg total) by mouth 3 (three) times daily. 08/06/18   Clapacs, Jackquline Denmark, MD  QUEtiapine (SEROQUEL) 100 MG tablet Take 1 tablet (100 mg total) by mouth every 4 (four) hours as needed (anxiety). 08/06/18   Clapacs, Jackquline Denmark, MD  sodium chloride 1 g tablet Take 1 tablet (1 g total) by mouth daily. 08/06/18   Clapacs, Jackquline Denmark, MD    Allergies Haldol [haloperidol lactate]; Inderal [propranolol]; Januvia [sitagliptin]; Lithium; Prozac [fluoxetine hcl]; and Thorazine [chlorpromazine]  No family history on  file.  Social History Social History   Tobacco Use  . Smoking status: Never Smoker  . Smokeless tobacco: Never Used  Substance Use Topics  . Alcohol use: No  . Drug use: Yes    Types: Marijuana    Comment: quit 5 years ago    Review of Systems  Constitutional: No fever/chills Eyes: No visual changes. ENT: No sore throat. Cardiovascular: Positive for chest pain. Respiratory: Denies shortness of breath. Gastrointestinal: No abdominal pain.  No nausea, no vomiting.  No diarrhea.  No constipation. Genitourinary: Negative for dysuria. Musculoskeletal: Negative for back pain. Skin: Negative for rash. Neurological: Negative for headaches, focal weakness or numbness. Psychiatric:Positive for depression.  ____________________________________________   PHYSICAL EXAM:  VITAL SIGNS: ED Triage Vitals  Enc Vitals Group     BP 08/07/18 2033 116/79     Pulse Rate 08/07/18 2033 (!) 101     Resp 08/07/18 2033 18     Temp 08/07/18 2033 98.6 F (37 C)     Temp Source 08/07/18 2033 Oral     SpO2 08/07/18 2033 97 %     Weight 08/07/18 2042 240 lb (108.9 kg)     Height 08/07/18 2042 6\' 6"  (1.981 m)     Head Circumference --      Peak Flow --      Pain Score 08/07/18 2033 8     Pain Loc --      Pain Edu? --      Excl. in GC? --     Constitutional: Alert and oriented. Well appearing and in no acute distress. Eyes: Conjunctivae are normal. PERRL. EOMI. Head: Atraumatic. Nose: No congestion/rhinnorhea. Mouth/Throat: Mucous membranes are moist.  Oropharynx non-erythematous. Neck: No stridor.  No cervical spine tenderness to palpation. Cardiovascular: Normal rate, regular rhythm. Grossly normal heart sounds.  Good peripheral circulation. Respiratory: Normal respiratory effort.  No retractions. Lungs CTAB. Gastrointestinal: Soft and nontender. No distention. No abdominal bruits. No CVA tenderness. Musculoskeletal: No lower extremity tenderness nor edema.  No joint  effusions. Neurologic:  Normal speech and language. No gross focal neurologic deficits are appreciated. No gait instability. Skin:  Skin is warm, dry and intact. No rash noted. Psychiatric: Mood and affect are normal. Speech and behavior are normal.  ____________________________________________   LABS (all labs ordered are listed, but only abnormal results are displayed)  Labs Reviewed  BASIC METABOLIC PANEL - Abnormal; Notable for the following components:      Result Value   Sodium 123 (*)    Chloride 92 (*)    Glucose, Bld 117 (*)    Calcium 8.8 (*)    All other components within normal limits  CBC - Abnormal; Notable for the following components:   RBC 3.22 (*)    Hemoglobin 10.6 (*)    HCT 28.9 (*)    MCHC 36.8 (*)    RDW 15.8 (*)    All other components within normal limits  TROPONIN I - Abnormal; Notable for the following components:   Troponin I 0.03 (*)    All other components within normal limits  TROPONIN I  TROPONIN I   ____________________________________________  EKG  ED ECG REPORT I, SUNG,JADE J, the attending physician, personally viewed and interpreted this ECG.   Date: 08/08/2018  EKG Time: 2040  Rate: 92  Rhythm: normal EKG, normal sinus rhythm  Axis: RAD  Intervals:none  ST&T Change: Nonspecific  ____________________________________________  RADIOLOGY  ED MD interpretation: No active cardiopulmonary process  Official radiology report(s): Dg Chest 2 View  Result Date: 08/07/2018 CLINICAL DATA:  Generalized chest pain after walking 4 miles today. EXAM: CHEST - 2 VIEW COMPARISON:  10/05/2017 FINDINGS: Lungs are adequately inflated and otherwise clear. Cardiomediastinal silhouette and remainder of the exam is unchanged. IMPRESSION: No active cardiopulmonary disease. Electronically Signed   By: Elberta Fortis M.D.   On: 08/07/2018 21:15    ____________________________________________   PROCEDURES  Procedure(s) performed:  None  Procedures  Critical Care performed: No  ____________________________________________   INITIAL IMPRESSION / ASSESSMENT AND PLAN / ED COURSE  As part of my medical decision making, I reviewed the following data within the electronic MEDICAL RECORD NUMBER Nursing notes reviewed and incorporated, Labs reviewed, Old chart reviewed, A consult was requested and obtained from this/these consultant(s) Psychiatry and Notes from prior ED visits   47 year old male with bipolar disorder who was recently discharged from the behavioral health unit who returns to be readmitted.  Contracts for safety while in the emergency department.  Initial troponin unremarkable.  Hyponatremia noted which is stable from 11 days ago; however, 13 days ago sodium was unremarkable.  Will infuse 2 L IV fluids.  Will keep patient voluntarily in the ED pending psychiatric consultation later this morning.  Clinical Course as of Aug 08 708  Fri Aug 08, 2018  0507 Repeat 8-hour troponin is 0.03.  I think this is clinically insignificant especially given that patient has had no chest pain since he arrived to the emergency department. However, since he will be held for psychiatry evaluation, will repeat 3 hour timed troponin.   [JS]  [id number] Care transferred to Dr. Cyril Loosen at shift change.  Repeat troponin to be drawn in 1 hour.  Anticipate it will be unremarkable and patient may be medically cleared at that time for psychiatric evaluation and disposition.  He will be kept under voluntary status in the ED.   [JS]    Clinical Course User Index [JS] Irean Hong, MD     ____________________________________________   FINAL CLINICAL IMPRESSION(S) / ED DIAGNOSES  Final diagnoses:  Nonspecific chest pain  Bipolar affective disorder, currently depressed, moderate (HCC)  Hyponatremia     ED Discharge Orders    None       Note:  This document was prepared using Dragon voice recognition software and may include  unintentional dictation errors.    Irean Hong, MD 08/08/18 [phone]
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
## 2018-08-08 NOTE — ED Notes (Signed)
Patient has been constantly getting up and going to the bathroom, not sure if hes using the bathroom or just getting up to talk with staff

## 2018-08-08 NOTE — ED Notes (Signed)
BEHAVIORAL HEALTH ROUNDING Patient sleeping: No. Patient alert and oriented: yes Behavior appropriate: Yes.  ; If no, describe:  Nutrition and fluids offered: yes Toileting and hygiene offered: Yes  Sitter present: q15 minute observations and security monitoring Law enforcement present: Yes    

## 2018-08-08 NOTE — ED Notes (Addendum)
Spoke to [place] [phone] from [facility] to inform that patient has been discharged from [facility]. Mrs. Ray stated "I am working on something and as soon as I am finished I will come and get him".

## 2018-08-08 NOTE — ED Notes (Signed)
Patient discharged back to [facility] by [employer], patient received discharge papers. Patient received belongings and verbalized he has received all of his belongings. Patient appropriate and cooperative, Denies SI/HI AVH. Vital signs taken. NAD noted.

## 2018-08-08 NOTE — ED Notes (Signed)
Pt assisted to bathroom with minimal assistance required.

## 2018-08-08 NOTE — Progress Notes (Signed)
LCSW spoke to [employer] and patient if DC will be picked up by group home provider no social work needs at this time.  [employer] LCSW [phone]

## 2018-08-08 NOTE — Consult Note (Signed)
[name]   Reason for Consult: Consult for this 47 year old man with chronic mental disabilities who came back to the emergency room after wandering away from his group home Referring Physician: Corky Downs Patient Identification: Oscar Duke MRN:  [id number] Principal Diagnosis: Schizoaffective disorder, bipolar type [facility]) Diagnosis:   Patient Active Problem List   Diagnosis Date Noted  . Intellectual disability with epilepsy (Oscar Duke) [F79, G40.909] 08/08/2018  . 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 Seizure ([place]) [R56.9] 07/21/2018  . GERD (gastroesophageal reflux disease) [K21.9] 07/20/2018  . Increased ammonia level [R79.89] 07/09/2018  . Hyponatremia [E87.1] 05/28/2018  . Schizoaffective disorder, bipolar type ([place]) [F25.0] 05/14/2018  . Somnolence [R40.0] 10/05/2017  . Adverse drug interaction with prescription medication [T50.905A] 10/05/2017  . Hypertension [I10]   . Diabetes mellitus without complication ([place]) [X21.1]   . Bipolar 1 disorder ([place]) [F31.9]   . Seizures ([place]) [R56.9]   . Schizophrenia, acute ([place]) [F23] 05/14/2017    Total Time spent with patient: 1 hour  Subjective:   Oscar Duke is a 47 y.o. male patient admitted with "I guess I am okay".  HPI: Patient seen chart reviewed.  Patient very well known from previous encounters.  He was actually just discharged from the emergency room yesterday back to his group home.  Apparently after getting there he got bored (which is usually how these things start) and decided to walk away from the group home.  This is not something he is allowed to do independently.  Apparently he took a walk for a while and then at some point knocked on a stranger's doors telling them that he had chest pain wanting to get taken to the hospital.  This morning it is documented that he told an interviewer that he knew that if he just said he was suicidal or homicidal I would let him stay in the hospital.  By the  time I got to talk to him however he had tone to this down.  He denied being suicidal or homicidal.  He said that he was just planning to go to the store to buy a candy bar.  He understands that the group home does not like him to walk away unsupervised which is probably exactly why he does it because he knows that if he asks they will say no.  He is not reporting any current psychotic symptoms any depression any mania he is not threatening.  He is exactly at the same mental status baseline that he was yesterday.  Social history: Patient has a legal guardian.  He is living in a group home here.  It took them quite a while to agree to take him back yesterday.  Medical history: Seizures history of head injury in the past diabetes history of hyponatremia which is related to his excessive water intoxication syndrome  Substance abuse history: None  Past Psychiatric History: Long history of mental health problems carries a diagnosis of schizoaffective disorder although he also has clear chronic cognitive impairment which I think is of a different order than schizoaffective disorder.  He has a reported history of serious head injury in the past.  Tends to be very simple and perseverative.  Has threatened suicide many times in the past.  Has done a little self injury but it does not appear that he has a history of really trying to kill himself.  Risk to Self:   Risk to Others:   Prior Inpatient Therapy:   Prior Outpatient Therapy:  Past Medical History:  Past Medical History:  Diagnosis Date  . Bipolar 1 disorder ([place])   . Diabetes mellitus without complication ([place])   . Hyperlipidemia   . Hypertension   . Manic affective disorder with recurrent episode ([place])   . Schizophrenia, acute ([place])   . Seizures ([place])     Past Surgical History:  Procedure Laterality Date  . JOINT REPLACEMENT     Family History: No family history on file. Family Psychiatric  History: Does not know of any Social  History:  Social History   Substance and Sexual Activity  Alcohol Use No     Social History   Substance and Sexual Activity  Drug Use Yes  . Types: Marijuana   Comment: quit 5 years ago    Social History   Socioeconomic History  . Marital status: Single    Spouse name: Not on file  . Number of children: Not on file  . Years of education: Not on file  . Highest education level: Not on file  Occupational History  . Not on file  Social Needs  . Financial resource strain: Not on file  . Food insecurity:    Worry: Not on file    Inability: Not on file  . Transportation needs:    Medical: Not on file    Non-medical: Not on file  Tobacco Use  . Smoking status: Never Smoker  . Smokeless tobacco: Never Used  Substance and Sexual Activity  . Alcohol use: No  . Drug use: Yes    Types: Marijuana    Comment: quit 5 years ago  . Sexual activity: Not on file  Lifestyle  . Physical activity:    Days per week: Not on file    Minutes per session: Not on file  . Stress: Not on file  Relationships  . Social connections:    Talks on phone: Not on file    Gets together: Not on file    Attends religious service: Not on file    Active member of club or organization: Not on file    Attends meetings of clubs or organizations: Not on file    Relationship status: Not on file  Other Topics Concern  . Not on file  Social History Narrative  . Not on file   Additional Social History:    Allergies:   Allergies  Allergen Reactions  . Haldol [Haloperidol Lactate]   . Inderal [Propranolol]   . Januvia [Sitagliptin] Other (See Comments)    Skin discoloration on lower legs  . Lithium Other (See Comments)    Seizures  . Prozac [Fluoxetine Hcl]   . Thorazine [Chlorpromazine]     Labs:  Results for orders placed or performed during the hospital encounter of 08/08/18 (from the past 48 hour(s))  Basic metabolic panel     Status: Abnormal   Collection Time: 08/07/18  8:36 PM  Result  Value Ref Range   Sodium 123 (L) 135 - 145 mmol/L   Potassium 3.9 3.5 - 5.1 mmol/L   Chloride 92 (L) 98 - 111 mmol/L   CO2 23 22 - 32 mmol/L   Glucose, Bld 117 (H) 70 - 99 mg/dL   BUN 12 6 - 20 mg/dL   Creatinine, Ser 0.78 0.61 - 1.24 mg/dL   Calcium 8.8 (L) 8.9 - 10.3 mg/dL   GFR calc non Af Amer >60 >60 mL/min   GFR calc Af Amer >60 >60 mL/min    Comment: (NOTE) The eGFR has been  calculated using the CKD EPI equation. This calculation has not been validated in all clinical situations. eGFR's persistently <60 mL/min signify possible Chronic Kidney Disease.    Anion gap 8 5 - 15    Comment: Performed at [facility], [address] [place], [place] [address]  CBC     Status: Abnormal   Collection Time: 08/07/18  8:36 PM  Result Value Ref Range   WBC 9.7 3.8 - 10.6 K/uL   RBC 3.22 (L) 4.40 - 5.90 MIL/uL   Hemoglobin 10.6 (L) 13.0 - 18.0 g/dL   HCT 28.9 (L) 40.0 - 52.0 %   MCV 89.8 80.0 - 100.0 fL   MCH 33.0 26.0 - 34.0 pg   MCHC 36.8 (H) 32.0 - 36.0 g/dL   RDW 15.8 (H) 11.5 - 14.5 %   Platelets 217 150 - 440 K/uL    Comment: Performed at [facility], [address] [place], [place] [address]  Troponin I     Status: None   Collection Time: 08/07/18  8:36 PM  Result Value Ref Range   Troponin I <0.03 <0.03 ng/mL    Comment: Performed at [facility], [address] [place], [place] [address]  Troponin I     Status: Abnormal   Collection Time: 08/08/18  4:14 AM  Result Value Ref Range   Troponin I 0.03 (HH) <0.03 ng/mL    Comment: CRITICAL RESULT CALLED TO, READ BACK BY AND VERIFIED WITH  ANN CALES AT 0505 08/08/18 SDR Performed at [facility], [address] [place], [place] [address]   Troponin I     Status: None   Collection Time: 08/08/18  8:11 AM  Result Value Ref Range   Troponin I <0.03 <0.03 ng/mL    Comment: Performed at [facility], [address] [place], [place] [address]    Current  Facility-Administered Medications  Medication Dose Route Frequency Provider Last Rate Last Dose  . amLODipine (NORVASC) tablet 5 mg  5 mg Oral Daily Lavonia Drafts, MD   5 mg at 08/08/18 0940  . benztropine (COGENTIN) tablet 2 mg  2 mg Oral BID Lavonia Drafts, MD   2 mg at 08/08/18 0940  . clonazePAM (KLONOPIN) tablet 1 mg  1 mg Oral QID Lavonia Drafts, MD   1 mg at 08/08/18 0940  . divalproex (DEPAKOTE) DR tablet 500 mg  500 mg Oral Q12H Lavonia Drafts, MD   500 mg at 08/08/18 0940  . fluPHENAZine (PROLIXIN) tablet 10 mg  10 mg Oral TID Lavonia Drafts, MD      . insulin detemir (LEVEMIR) injection 28 Units  28 Units Subcutaneous QHS Lavonia Drafts, MD      . levETIRAcetam (KEPPRA XR) 24 hr tablet 1,500 mg  1,500 mg Oral Daily Lavonia Drafts, MD      . lisinopril (PRINIVIL,ZESTRIL) tablet 20 mg  20 mg Oral Daily Lavonia Drafts, MD   20 mg at 08/08/18 0940  . pantoprazole (PROTONIX) EC tablet 40 mg  40 mg Oral Daily Lavonia Drafts, MD   40 mg at 08/08/18 0940  . perphenazine (TRILAFON) tablet 8 mg  8 mg Oral TID Lavonia Drafts, MD      . QUEtiapine (SEROQUEL) tablet 100 mg  100 mg Oral Q4H PRN Lavonia Drafts, MD   100 mg at 08/08/18 0941  . sodium chloride tablet 1 g  1 g Oral Daily Lavonia Drafts, MD       Current Outpatient Medications  Medication Sig Dispense  Refill  . amLODipine (NORVASC) 5 MG tablet Take 1 tablet (5 mg total) by mouth daily. 30 tablet 0  . benztropine (COGENTIN) 2 MG tablet Take 1 tablet (2 mg total) by mouth 2 (two) times daily. 60 tablet 0  . clonazePAM (KLONOPIN) 1 MG tablet Take 1 tablet (1 mg total) by mouth 4 (four) times daily. 120 tablet 0  . divalproex (DEPAKOTE) 500 MG DR tablet Take 1 tablet (500 mg total) by mouth every 12 (twelve) hours. 60 tablet 0  . fluPHENAZine (PROLIXIN) 10 MG tablet Take 1 tablet (10 mg total) by mouth 3 (three) times daily. 90 tablet 0  . ibuprofen (ADVIL,MOTRIN) 600 MG tablet Take 1 tablet (600 mg total) by mouth every 6 (six) hours as  needed for headache or moderate pain. 30 tablet 0  . insulin detemir (LEVEMIR) 100 UNIT/ML injection Inject 0.28 mLs (28 Units total) into the skin at bedtime. 10 mL 11  . lactulose (CHRONULAC) 10 GM/15ML solution Take 15 mLs (10 g total) by mouth 2 (two) times daily. 600 mL 0  . Levetiracetam 750 MG TB24 Take 2 tablets (1,500 mg total) by mouth daily. 60 tablet 0  . lisinopril (PRINIVIL,ZESTRIL) 20 MG tablet Take 1 tablet (20 mg total) by mouth daily. 30 tablet 0  . pantoprazole (PROTONIX) 40 MG tablet Take 1 tablet (40 mg total) by mouth daily. 30 tablet 0  . perphenazine (TRILAFON) 8 MG tablet Take 1 tablet (8 mg total) by mouth 3 (three) times daily. 90 tablet 0  . QUEtiapine (SEROQUEL) 100 MG tablet Take 1 tablet (100 mg total) by mouth every 4 (four) hours as needed (anxiety). 60 tablet 0  . sodium chloride 1 g tablet Take 1 tablet (1 g total) by mouth daily. 30 tablet 0    Musculoskeletal: Strength & Muscle Tone: within normal limits Gait & Station: normal Patient leans: N/A  Psychiatric Specialty Exam: Physical Exam  Nursing note and vitals reviewed. Constitutional: He appears well-developed and well-nourished.  HENT:  Head: Normocephalic and atraumatic.  Eyes: Pupils are equal, round, and reactive to light. Conjunctivae are normal.  Neck: Normal range of motion.  Cardiovascular: Regular rhythm and normal heart sounds.  Respiratory: Effort normal. No respiratory distress.  GI: Soft.  Musculoskeletal: Normal range of motion.  Neurological: He is alert.  Skin: Skin is warm and dry.  Psychiatric: His affect is blunt. His speech is delayed and tangential. He is slowed. Thought content is not paranoid. He expresses impulsivity. He expresses no homicidal and no suicidal ideation. He exhibits abnormal recent memory.    Review of Systems  Constitutional: Negative.   HENT: Negative.   Eyes: Negative.   Respiratory: Negative.   Cardiovascular: Negative.   Gastrointestinal:  Negative.   Musculoskeletal: Negative.   Skin: Negative.   Neurological: Negative.   Psychiatric/Behavioral: Negative.     Blood pressure 124/84, pulse 94, temperature 97.8 F (36.6 C), temperature source Oral, resp. rate 18, height _0  (1.981 m), weight 108.9 kg, SpO2 100 %.Body mass index is 27.73 kg/m.  General Appearance: Casual  Eye Contact:  Good  Speech:  Slow  Volume:  Normal  Mood:  Euthymic  Affect:  Constricted  Thought Process:  Coherent  Orientation:  Full (Time, Place, and Person)  Thought Content:  Rumination and Tangential  Suicidal Thoughts:  No  Homicidal Thoughts:  No  Memory:  Immediate;   Fair Recent;   Fair Remote;   Fair  Judgement:  Impaired  Insight:  Shallow  Psychomotor Activity:  Normal  Concentration:  Concentration: Fair  Recall:  [employer] of Knowledge:  Fair  Language:  Fair  Akathisia:  No  Handed:  Right  AIMS (if indicated):     Assets:  Desire for Improvement Housing Resilience  ADL's:  Impaired  Cognition:  Impaired,  Mild and Moderate  Sleep:        Treatment Plan Summary: Plan This 47 year old man with chronic impairment is at exactly the same baseline he was at yesterday.  This is his major problem behavior in the community that he wanders away from group homes and gets lost or just decides he is not going to go back and instead get someone to bring him to the hospital.  I pointed out to him how he really needs to break this habit otherwise he is going to lose all hope that placement.  I reminded him that he really did not like being in the emergency room for weeks at a time and that if he wants to stay outside of the hospital he needs to put forth some more effort to cooperate with the group home.  He at least expresses a superficial understanding.  Does not meet commitment criteria.  Patient can be discharged from the emergency room no change to medicine no new prescriptions send him back to his group home.  Disposition: No  evidence of imminent risk to self or others at present.   Patient does not meet criteria for psychiatric inpatient admission. Supportive therapy provided about ongoing stressors. Discussed crisis plan, support from social network, calling 911, coming to the Emergency Department, and calling Suicide Hotline.  Alethia Berthold, MD 08/08/2018 11:20 AM

## 2018-08-08 NOTE — ED Notes (Addendum)
Patient speaking to Beckie Salts his caretaker

## 2018-08-08 NOTE — ED Provider Notes (Signed)
Cleared for discharge by Dr. Luz Lex, MD 08/08/18 1137

## 2018-08-11 ENCOUNTER — Encounter: Payer: Self-pay | Admitting: Podiatry

## 2018-08-11 ENCOUNTER — Ambulatory Visit (INDEPENDENT_AMBULATORY_CARE_PROVIDER_SITE_OTHER): Payer: Medicare Other | Admitting: Podiatry

## 2018-08-11 DIAGNOSIS — B351 Tinea unguium: Secondary | ICD-10-CM | POA: Diagnosis not present

## 2018-08-11 DIAGNOSIS — M79676 Pain in unspecified toe(s): Secondary | ICD-10-CM

## 2018-08-11 DIAGNOSIS — M2010 Hallux valgus (acquired), unspecified foot: Secondary | ICD-10-CM | POA: Diagnosis not present

## 2018-08-11 DIAGNOSIS — S98131S Complete traumatic amputation of one right lesser toe, sequela: Secondary | ICD-10-CM

## 2018-08-11 DIAGNOSIS — M2040 Other hammer toe(s) (acquired), unspecified foot: Secondary | ICD-10-CM | POA: Diagnosis not present

## 2018-08-11 NOTE — Progress Notes (Signed)
Complaint:  Visit Type: Patient returns to my office for continued preventative foot care services. Complaint: Patient states" my nails have grown long and thick and become painful to walk and wear shoes"  The patient presents for preventative foot care services. No changes to ROS  Podiatric Exam: Vascular: dorsalis pedis and posterior tibial pulses are palpable bilateral. Capillary return is immediate. Temperature gradient is WNL. Skin turgor WNL  Sensorium: Normal Semmes Weinstein monofilament test. Normal tactile sensation bilaterally. Nail Exam: Pt has thick disfigured discolored nails with subungual debris noted bilateral entire nail hallux through fifth toenails.  His right hallux nail plate has pulled away from the proximal nail fold and has become unattached to the nailbed. No signs of redness, swelling or infection or drainage Ulcer Exam: There is no evidence of ulcer or pre-ulcerative changes or infection. Orthopedic Exam: Muscle tone and strength are WNL. No limitations in general ROM. No crepitus or effusions noted. Foot type and digits show no abnormalities. Bony prominences are unremarkable. 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  Hammer toes B/L Skin: No Porokeratosis. No infection or ulcers  Diagnosis:  Onychomycosis, , Pain in right toe, pain in left toes  Treatment & Plan Procedures and Treatment: Consent by patient was obtained for treatment procedures.   Debridement of mycotic and hypertrophic toenails, 1 through 5 bilateral and clearing of subungual debris. No ulceration, no infection noted. Note  ABN given to caregiver to sign. Return Visit-Office Procedure: Patient instructed to return to the office for a follow up visit 3 months for continued evaluation and treatment.    Helane Gunther DPM

## 2018-08-12 ENCOUNTER — Other Ambulatory Visit: Payer: Self-pay

## 2018-08-12 ENCOUNTER — Emergency Department
Admission: EM | Admit: 2018-08-12 | Discharge: 2018-08-12 | Disposition: A | Discharge status: Home / Self Care | Payer: Medicare Other | Attending: Emergency Medicine | Admitting: Emergency Medicine

## 2018-08-12 ENCOUNTER — Emergency Department: Payer: Medicare Other

## 2018-08-12 DIAGNOSIS — Z794 Long term (current) use of insulin: Secondary | ICD-10-CM | POA: Diagnosis not present

## 2018-08-12 DIAGNOSIS — I10 Essential (primary) hypertension: Secondary | ICD-10-CM | POA: Diagnosis not present

## 2018-08-12 DIAGNOSIS — R42 Dizziness and giddiness: Secondary | ICD-10-CM

## 2018-08-12 DIAGNOSIS — Z79899 Other long term (current) drug therapy: Secondary | ICD-10-CM | POA: Insufficient documentation

## 2018-08-12 DIAGNOSIS — E860 Dehydration: Secondary | ICD-10-CM | POA: Insufficient documentation

## 2018-08-12 DIAGNOSIS — E119 Type 2 diabetes mellitus without complications: Secondary | ICD-10-CM | POA: Diagnosis not present

## 2018-08-12 DIAGNOSIS — F79 Unspecified intellectual disabilities: Secondary | ICD-10-CM | POA: Insufficient documentation

## 2018-08-12 DIAGNOSIS — I951 Orthostatic hypotension: Secondary | ICD-10-CM | POA: Insufficient documentation

## 2018-08-12 DIAGNOSIS — W19XXXA Unspecified fall, initial encounter: Secondary | ICD-10-CM

## 2018-08-12 DIAGNOSIS — M549 Dorsalgia, unspecified: Secondary | ICD-10-CM | POA: Diagnosis present

## 2018-08-12 LAB — COMPREHENSIVE METABOLIC PANEL
ALT: 9 U/L (ref 0–44)
AST: 18 U/L (ref 15–41)
Albumin: 4 g/dL (ref 3.5–5.0)
Alkaline Phosphatase: 47 U/L (ref 38–126)
Anion gap: 11 (ref 5–15)
BUN: 13 mg/dL (ref 6–20)
CO2: 22 mmol/L (ref 22–32)
Calcium: 8.9 mg/dL (ref 8.9–10.3)
Chloride: 95 mmol/L — ABNORMAL LOW (ref 98–111)
Creatinine, Ser: 0.83 mg/dL (ref 0.61–1.24)
GFR calc Af Amer: >60 mL/min (ref >60)
GFR calc non Af Amer: >60 mL/min (ref >60)
Glucose, Bld: 128 mg/dL — ABNORMAL HIGH (ref 70–99)
Potassium: 4.3 mmol/L (ref 3.5–5.1)
Sodium: 128 mmol/L — ABNORMAL LOW (ref 135–145)
Total Bilirubin: 0.7 mg/dL (ref 0.3–1.2)
Total Protein: 6.6 g/dL (ref 6.5–8.1)

## 2018-08-12 LAB — CBC WITH DIFFERENTIAL/PLATELET
Basophils Absolute: 0 10*3/uL (ref 0–0.1)
Basophils Relative: 1 %
Eosinophils Absolute: 0.1 10*3/uL (ref 0–0.7)
Eosinophils Relative: 1 %
HEMATOCRIT: 30.1 % — AB (ref 40.0–52.0)
HEMOGLOBIN: 11.1 g/dL — AB (ref 13.0–18.0)
LYMPHS ABS: 2.4 10*3/uL (ref 1.0–3.6)
Lymphocytes Relative: 32 %
MCH: 33.9 pg (ref 26.0–34.0)
MCHC: 36.8 g/dL — AB (ref 32.0–36.0)
MCV: 92.3 fL (ref 80.0–100.0)
Monocytes Absolute: 0.8 10*3/uL (ref 0.2–1.0)
Monocytes Relative: 10 %
NEUTROS ABS: 4.2 10*3/uL (ref 1.4–6.5)
NEUTROS PCT: 56 %
Platelets: 197 10*3/uL (ref 150–440)
RBC: 3.26 MIL/uL — AB (ref 4.40–5.90)
RDW: 15.9 % — ABNORMAL HIGH (ref 11.5–14.5)
WBC: 7.5 10*3/uL (ref 3.8–10.6)

## 2018-08-12 LAB — URINALYSIS, COMPLETE (UACMP) WITH MICROSCOPIC
BACTERIA UA: NONE SEEN
Bilirubin Urine: NEGATIVE
Glucose, UA: NEGATIVE mg/dL
Hgb urine dipstick: NEGATIVE
Ketones, ur: NEGATIVE mg/dL
LEUKOCYTES UA: NEGATIVE
Nitrite: NEGATIVE
PH: 7 (ref 5.0–8.0)
Protein, ur: NEGATIVE mg/dL
SPECIFIC GRAVITY, URINE: 1.005 (ref 1.005–1.030)
SQUAMOUS EPITHELIAL / LPF: NONE SEEN (ref 0–5)

## 2018-08-12 LAB — TROPONIN I: Troponin I: <0.03 ng/mL (ref <0.03)

## 2018-08-12 MED ORDER — SODIUM CHLORIDE 0.9 % IV BOLUS
1000.0000 mL | Freq: Once | INTRAVENOUS | Status: AC
  Administered 2018-08-12: 1000 mL via INTRAVENOUS

## 2018-08-12 MED ORDER — SODIUM CHLORIDE 0.9 % IV BOLUS: 1000.0000 mL | Freq: Once | INTRAVENOUS | Status: DC

## 2018-08-12 MED ORDER — ACETAMINOPHEN 500 MG PO TABS
1000.0000 mg | ORAL_TABLET | Freq: Once | ORAL | Status: AC
  Administered 2018-08-12: 1000 mg via ORAL
  Filled 2018-08-12: qty 2

## 2018-08-12 NOTE — ED Notes (Signed)
Pt given sandwich and drink while waiting on cab.  Will remain in bed until cab comes as pt has legal guardian that was contacted by previous RN.

## 2018-08-12 NOTE — Discharge Instructions (Signed)
Stop your amlodipine and lisinopril. Check blood pressure twice a day for the next week and follow up with your doctor for re-evaluation of your blood pressure.

## 2018-08-12 NOTE — ED Notes (Signed)
Pt given crackers and peanut butter.  Ambulating to restroom. NAD. waiting on cab

## 2018-08-12 NOTE — ED Provider Notes (Addendum)
[facility] Emergency Department Provider Note  ____________________________________________  Time seen: Approximately 4:12 PM  I have reviewed the triage vital signs and the nursing notes.   HISTORY  Chief Complaint Back Pain   HPI Oscar Duke is a 47 y.o. male a history of bipolar disorder, hypertension, hyperlipidemia, schizophrenia who presents for evaluation of back pain.  Patient reports 2 falls over the last 2 days.  He reports that both of him he felt dizzy as soon as he stood up and fell hitting his back onto a wall.  No head trauma.  He is complaining of diffuse back pain that is worse with movement, the pain is soreness and sharp, none at rest.  He denies hip pain or leg pains, no chest pain or shortness of breath.  He endorses normal p.o. intake.  He has been taking his antihypertensive medication.  He denies fever. NO HI or SI  Past Medical History:  Diagnosis Date  . Bipolar 1 disorder (HCC)   . Diabetes mellitus without complication (HCC)   . Hyperlipidemia   . Hypertension   . 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 Schizophrenia, acute (HCC)   . Seizures [facility])     Patient Active Problem List   Diagnosis Date Noted  . 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 Water intoxication syndrome 07/24/2018  . Seizure (HCC) 07/21/2018  . GERD (gastroesophageal reflux disease) 07/20/2018  . Increased ammonia level 07/09/2018  . Hyponatremia 05/28/2018  . Schizoaffective disorder, bipolar type (HCC) 05/14/2018  . Somnolence 10/05/2017  . Adverse drug interaction with prescription medication 10/05/2017  . Hypertension   . Diabetes mellitus without complication (HCC)   . Bipolar 1 disorder (HCC)   . Seizures (HCC)   . Schizophrenia, acute (HCC) 05/14/2017    Past Surgical History:  Procedure Laterality Date  . JOINT REPLACEMENT      Prior to Admission medications   Medication Sig Start Date End Date  Taking? Authorizing Provider  benztropine (COGENTIN) 2 MG tablet Take 1 tablet (2 mg total) by mouth 2 (two) times daily. 08/06/18   Clapacs, Jackquline Denmark, MD  clonazePAM (KLONOPIN) 1 MG tablet Take 1 tablet (1 mg total) by mouth 4 (four) times daily. 08/06/18   Clapacs, Jackquline Denmark, MD  divalproex (DEPAKOTE) 500 MG DR tablet Take 1 tablet (500 mg total) by mouth every 12 (twelve) hours. 08/06/18   Clapacs, Jackquline Denmark, MD  fluPHENAZine (PROLIXIN) 10 MG tablet Take 1 tablet (10 mg total) by mouth 3 (three) times daily. 08/06/18   Clapacs, Jackquline Denmark, MD  ibuprofen (ADVIL,MOTRIN) 600 MG tablet Take 1 tablet (600 mg total) by mouth every 6 (six) hours as needed for headache or moderate pain. 08/06/18   Clapacs, Jackquline Denmark, MD  insulin detemir (LEVEMIR) 100 UNIT/ML injection Inject 0.28 mLs (28 Units total) into the skin at bedtime. 08/06/18   Clapacs, Jackquline Denmark, MD  lactulose (CHRONULAC) 10 GM/15ML solution Take 15 mLs (10 g total) by mouth 2 (two) times daily. 08/06/18   Clapacs, Jackquline Denmark, MD  levETIRAcetam (KEPPRA) 750 MG tablet  08/06/18   [provider]  pantoprazole (PROTONIX) 40 MG tablet Take 1 tablet (40 mg total) by mouth daily. 08/06/18   Clapacs, Jackquline Denmark, MD  perphenazine (TRILAFON) 8 MG tablet Take 1 tablet (8 mg total) by mouth 3 (three) times daily. 08/06/18   Clapacs, Jackquline Denmark, MD  QUEtiapine (SEROQUEL) 100 MG tablet Take 1 tablet (100 mg total)  by mouth every 4 (four) hours as needed (anxiety). 08/06/18   Clapacs, Jackquline Denmark, MD  sodium chloride 1 g tablet Take 1 tablet (1 g total) by mouth daily. 08/06/18   Clapacs, Jackquline Denmark, MD  TRUE METRIX BLOOD GLUCOSE TEST test strip  08/06/18   [provider]    Allergies Haldol [haloperidol lactate]; Inderal [propranolol]; Januvia [sitagliptin]; Lithium; Prozac [fluoxetine hcl]; and Thorazine [chlorpromazine]  History reviewed. No pertinent family history.  Social History Social History   Tobacco Use  . Smoking status: Never Smoker  . Smokeless tobacco: Never Used    Substance Use Topics  . Alcohol use: No  . Drug use: Yes    Types: Marijuana    Comment: quit 5 years ago    Review of Systems  Constitutional: Negative for fever. + Lightheadedness Eyes: Negative for visual changes. ENT: Negative for sore throat. Neck: No neck pain  Cardiovascular: Negative for chest pain. Respiratory: Negative for shortness of breath. Gastrointestinal: Negative for abdominal pain, vomiting or diarrhea. Genitourinary: Negative for dysuria. Musculoskeletal: + diffuse back pain. Skin: Negative for rash. Neurological: Negative for headaches, weakness or numbness. Psych: No SI or HI  ____________________________________________   PHYSICAL EXAM:  VITAL SIGNS: ED Triage Vitals  Enc Vitals Group     BP 08/12/18 1501 (!) 89/56     Pulse Rate 08/12/18 1501 100     Resp 08/12/18 1501 18     Temp 08/12/18 1501 98 F (36.7 C)     Temp Source 08/12/18 1501 Oral     SpO2 08/12/18 1501 100 %     Weight 08/12/18 1455 238 lb 1.6 oz (108 kg)     Height 08/12/18 1455 6\' 6"  (1.981 m)     Head Circumference --      Peak Flow --      Pain Score 08/12/18 1455 8     Pain Loc --      Pain Edu? --      Excl. in GC? --     Constitutional: Alert and oriented. Well appearing and in no apparent distress. HEENT:      Head: Normocephalic and atraumatic.         Eyes: Conjunctivae are normal. Sclera is non-icteric.       Mouth/Throat: Mucous membranes are moist.       Neck: Supple with no signs of meningismus. No midline cspine tenderness Cardiovascular: Tachycardic with regular rhythm. No murmurs, gallops, or rubs. 2+ symmetrical distal pulses are present in all extremities. No JVD. Respiratory: Normal respiratory effort. Lungs are clear to auscultation bilaterally. No wheezes, crackles, or rhonchi.  Gastrointestinal: Soft, non tender, and non distended with positive bowel sounds. No rebound or guarding. Musculoskeletal: Nontender with normal range of motion in all  extremities. No midline T and L spine tenderness, diffuse bilateral paraspinal tenderness with no bruises. No edema, cyanosis, or erythema of extremities. Neurologic: Normal speech and language. Face is symmetric. Moving all extremities. No gross focal neurologic deficits are appreciated. Skin: Skin is warm, dry and intact. No rash noted. Psychiatric: Mood and affect are normal. Slightly pressured speech ____________________________________________   LABS (all labs ordered are listed, but only abnormal results are displayed)  Labs Reviewed  CBC WITH DIFFERENTIAL/PLATELET - Abnormal; Notable for the following components:      Result Value   RBC 3.26 (*)    Hemoglobin 11.1 (*)    HCT 30.1 (*)    MCHC 36.8 (*)    RDW 15.9 (*)  All other components within normal limits  COMPREHENSIVE METABOLIC PANEL - Abnormal; Notable for the following components:   Sodium 128 (*)    Chloride 95 (*)    Glucose, Bld 128 (*)    All other components within normal limits  URINALYSIS, COMPLETE (UACMP) WITH MICROSCOPIC - Abnormal; Notable for the following components:   Color, Urine STRAW (*)    APPearance CLEAR (*)    All other components within normal limits  TROPONIN I   ____________________________________________  EKG  ED ECG REPORT I, Nita Sickle, the attending physician, personally viewed and interpreted this ECG.  Normal sinus rhythm, rate of 89, normal intervals, normal axis, no ST elevations or depressions.  Normal EKG unchanged from prior. ____________________________________________  RADIOLOGY  I have personally reviewed the images performed during this visit and I agree with the Radiologist's read.   Interpretation by Radiologist:  Dg Thoracic Spine 2 View  Result Date: 08/12/2018 CLINICAL DATA:  Back pain.  Fall yesterday. EXAM: THORACIC SPINE 2 VIEWS COMPARISON:  08/07/2018 FINDINGS: Negative for fracture or mass. Disc degeneration and right anterior spurring in the mid  lower thoracic spine appears chronic. IMPRESSION: Negative for thoracic spine fracture Electronically Signed   By: Marlan Palau M.D.   On: 08/12/2018 16:33   Dg Lumbar Spine Complete  Result Date: 08/12/2018 CLINICAL DATA:  Low back pain after fall twice yesterday. EXAM: LUMBAR SPINE - COMPLETE 4+ VIEW COMPARISON:  07/09/2018 FINDINGS: Vertebral body alignment and heights are normal. There is mild spondylosis throughout the lumbar spine to include facet arthropathy over the lower lumbar spine. No evidence of compression fracture or spondylolisthesis. Disc spaces are unremarkable. IMPRESSION: No acute findings. Mild spondylosis of the lumbar spine. Electronically Signed   By: Elberta Fortis M.D.   On: 08/12/2018 16:32     ____________________________________________   PROCEDURES  Procedure(s) performed: None Procedures Critical Care performed:  CRITICAL CARE Performed by: Nita Sickle  ?  Total critical care time: 30 min  Critical care time was exclusive of separately billable procedures and treating other patients.  Critical care was necessary to treat or prevent imminent or life-threatening deterioration.  Critical care was time spent personally by me on the following activities: development of treatment plan with patient and/or surrogate as well as nursing, discussions with consultants, evaluation of patient's response to treatment, examination of patient, obtaining history from patient or surrogate, ordering and performing treatments and interventions, ordering and review of laboratory studies, ordering and review of radiographic studies, pulse oximetry and re-evaluation of patient's condition.  ____________________________________________   INITIAL IMPRESSION / ASSESSMENT AND PLAN / ED COURSE   47 y.o. male a history of bipolar disorder, hypertension, hyperlipidemia, schizophrenia who presents for evaluation of back pain after 2 falls due to dizziness.  Patient arrives in  the emergency department in tachycardic and hypotensive, afebrile, is otherwise extremely well-appearing, there is no obvious signs of trauma, he does have diffuse paraspinal tenderness bilaterally with no midline CT and L-spine tenderness, abdomen is soft with no pulsatile mass and no pain. Ddx includes dehydration, anemia, AKI, UTI. Labs, EKG, UA, orthostatic vital signs pending. Will give IVF. Tylenol for pain. XR of the spine to rule out injury.  Clinical Course as of Aug 12 1841  Tue Aug 12, 2018  1831 labs with no acute findings.  Vitals improved after IV fluids.  Most likely dehydration.  Patient has been walking up and down the emergency department looking extremely well, no vomiting, had a meal.  Will discharge  back to his group home and recommend holding patient BP meds, keep BP diary for a few days and f/u with PCP.   [CV]    Clinical Course User Index [CV] Don Perking Washington, MD     As part of my medical decision making, I reviewed the following data within the electronic MEDICAL RECORD NUMBER Nursing notes reviewed and incorporated, Labs reviewed , EKG interpreted , Old EKG reviewed, Old chart reviewed, Radiograph reviewed , Notes from prior ED visits and [place] Controlled Substance Database    Pertinent labs & imaging results that were available during my care of the patient were reviewed by me and considered in my medical decision making (see chart for details).    ____________________________________________   FINAL CLINICAL IMPRESSION(S) / ED DIAGNOSES  Final diagnoses:  Dehydration  Orthostatic dizziness  Fall, initial encounter  Acute bilateral back pain, unspecified back location      NEW MEDICATIONS STARTED DURING THIS VISIT:  ED Discharge Orders    None       Note:  This document was prepared using Dragon voice recognition software and may include unintentional dictation errors.    Nita Sickle, MD 08/12/18 1842    Nita Sickle, MD 08/26/18  [phone]

## 2018-08-12 NOTE — ED Notes (Signed)
Pt ambulatory to bathroom independently.  Pt declines urinal.  Pt denies any dizziness at this time, pt with steady gait.  Charge RN aware of patient non-compliance.

## 2018-08-12 NOTE — ED Notes (Signed)
Pt provided urinal; pt refuses to use urinal, demands that he is going to walk to the bathroom despite this RN suggestion to remain at 1H due to safety concerns.  This RN assisted patient to private bathroom and then walked back to his bed.  Pt with steady gait at this time, denies any dizziness.

## 2018-08-12 NOTE — ED Notes (Addendum)
Pt placed in cab for discharge back to group home.  Cab has contract with group home.  NAD, VSS, pt unable to sign as has legal guardian

## 2018-08-12 NOTE — ED Notes (Signed)
Pt provided sandwich tray 

## 2018-08-12 NOTE — ED Notes (Signed)
This RN attempted to reach [facility] x 2, no answer at this time.  Voicemail left to receive call back.

## 2018-08-12 NOTE — ED Triage Notes (Signed)
Pt arrives via EMS from [facility] c/o back pain "all over" states that he fell into wall x2 yesterday. Per EMS initial set of vitals BP 97/61 HR 120 after standing BP 80/51 HR 128.

## 2018-08-12 NOTE — ED Notes (Signed)
Patient transported to X-ray 

## 2018-08-14 ENCOUNTER — Other Ambulatory Visit: Payer: Self-pay | Admitting: Psychiatry

## 2018-09-08 ENCOUNTER — Other Ambulatory Visit: Payer: Self-pay | Admitting: Psychiatry

## 2018-09-10 ENCOUNTER — Emergency Department
Admission: EM | Admit: 2018-09-10 | Discharge: 2018-09-10 | Disposition: A | Discharge status: Home / Self Care | Payer: Medicare Other | Attending: Student in an Organized Health Care Education/Training Program | Admitting: Student in an Organized Health Care Education/Training Program

## 2018-09-10 ENCOUNTER — Emergency Department: Payer: Medicare Other

## 2018-09-10 ENCOUNTER — Other Ambulatory Visit: Payer: Self-pay

## 2018-09-10 ENCOUNTER — Encounter: Payer: Self-pay | Admitting: Emergency Medicine

## 2018-09-10 DIAGNOSIS — X31XXXA Exposure to excessive natural cold, initial encounter: Secondary | ICD-10-CM | POA: Insufficient documentation

## 2018-09-10 DIAGNOSIS — R6889 Other general symptoms and signs: Secondary | ICD-10-CM | POA: Diagnosis present

## 2018-09-10 DIAGNOSIS — I10 Essential (primary) hypertension: Secondary | ICD-10-CM | POA: Insufficient documentation

## 2018-09-10 DIAGNOSIS — Z79899 Other long term (current) drug therapy: Secondary | ICD-10-CM | POA: Diagnosis not present

## 2018-09-10 DIAGNOSIS — E119 Type 2 diabetes mellitus without complications: Secondary | ICD-10-CM | POA: Insufficient documentation

## 2018-09-10 DIAGNOSIS — T68XXXA Hypothermia, initial encounter: Secondary | ICD-10-CM | POA: Insufficient documentation

## 2018-09-10 DIAGNOSIS — E871 Hypo-osmolality and hyponatremia: Secondary | ICD-10-CM | POA: Insufficient documentation

## 2018-09-10 LAB — CBC WITH DIFFERENTIAL/PLATELET
Abs Immature Granulocytes: 0.03 10*3/uL (ref 0.00–0.07)
Basophils Absolute: 0 10*3/uL (ref 0.0–0.1)
Basophils Relative: 0 %
EOS ABS: 0 10*3/uL (ref 0.0–0.5)
EOS PCT: 1 %
HCT: 35.7 % — ABNORMAL LOW (ref 39.0–52.0)
HEMOGLOBIN: 12.5 g/dL — AB (ref 13.0–17.0)
Immature Granulocytes: 1 %
Lymphocytes Relative: 24 %
Lymphs Abs: 1.3 10*3/uL (ref 0.7–4.0)
MCH: 31.6 pg (ref 26.0–34.0)
MCHC: 35 g/dL (ref 30.0–36.0)
MCV: 90.4 fL (ref 80.0–100.0)
MONO ABS: 0.4 10*3/uL (ref 0.1–1.0)
Monocytes Relative: 8 %
NRBC: 0 % (ref 0.0–0.2)
Neutro Abs: 3.7 10*3/uL (ref 1.7–7.7)
Neutrophils Relative %: 66 %
Platelets: 145 10*3/uL — ABNORMAL LOW (ref 150–400)
RBC: 3.95 MIL/uL — ABNORMAL LOW (ref 4.22–5.81)
RDW: 14.2 % (ref 11.5–15.5)
WBC: 5.6 10*3/uL (ref 4.0–10.5)

## 2018-09-10 LAB — URINALYSIS, COMPLETE (UACMP) WITH MICROSCOPIC
BILIRUBIN URINE: NEGATIVE
Bacteria, UA: NONE SEEN
Glucose, UA: NEGATIVE mg/dL
Hgb urine dipstick: NEGATIVE
KETONES UR: NEGATIVE mg/dL
LEUKOCYTES UA: NEGATIVE
Nitrite: NEGATIVE
PH: 6 (ref 5.0–8.0)
PROTEIN: NEGATIVE mg/dL
SQUAMOUS EPITHELIAL / LPF: NONE SEEN (ref 0–5)
Specific Gravity, Urine: 1.003 — ABNORMAL LOW (ref 1.005–1.030)

## 2018-09-10 LAB — COMPREHENSIVE METABOLIC PANEL
ALT: 10 U/L (ref 0–44)
ANION GAP: 12 (ref 5–15)
AST: 16 U/L (ref 15–41)
Albumin: 4.5 g/dL (ref 3.5–5.0)
Alkaline Phosphatase: 46 U/L (ref 38–126)
BILIRUBIN TOTAL: 0.6 mg/dL (ref 0.3–1.2)
BUN: 6 mg/dL (ref 6–20)
CALCIUM: 9.3 mg/dL (ref 8.9–10.3)
CHLORIDE: 91 mmol/L — AB (ref 98–111)
CO2: 24 mmol/L (ref 22–32)
CREATININE: 0.77 mg/dL (ref 0.61–1.24)
GFR calc Af Amer: >60 mL/min (ref >60)
GLUCOSE: 120 mg/dL — AB (ref 70–99)
Potassium: 4.3 mmol/L (ref 3.5–5.1)
Sodium: 127 mmol/L — ABNORMAL LOW (ref 135–145)
TOTAL PROTEIN: 7.7 g/dL (ref 6.5–8.1)

## 2018-09-10 LAB — INFLUENZA PANEL BY PCR (TYPE A & B)
Influenza A By PCR: NEGATIVE
Influenza B By PCR: NEGATIVE

## 2018-09-10 LAB — TROPONIN I: Troponin I: <0.03 ng/mL (ref <0.03)

## 2018-09-10 LAB — LACTIC ACID, PLASMA: LACTIC ACID, VENOUS: 0.8 mmol/L (ref 0.5–1.9)

## 2018-09-10 LAB — TSH: TSH: 2.459 u[IU]/mL (ref 0.350–4.500)

## 2018-09-10 NOTE — ED Provider Notes (Signed)
[facility] Emergency Department Provider Note    First MD Initiated Contact with Patient 09/10/18 1339     (approximate)  I have reviewed the triage vital signs and the nursing notes.   HISTORY  Chief Complaint Low blood temperature   HPI Oscar Duke is a 47 y.o. male with below listed pmh who presents from clinic due to low temperature.  Patient asymptomatic and denies any complaints.  States that he did feel cool and cold after getting out side this morning.  Denies any cough.   Has had some increased urinary frequency.  No recent antibiotics.  States that he feels better since coming inside.   Past Medical History:  Diagnosis Date  . Bipolar 1 disorder (HCC)   . Diabetes mellitus without complication (HCC)   . Hyperlipidemia   . Hypertension   . 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 Schizophrenia, acute (HCC)   . Seizures (HCC)    No family history on file. Past Surgical History:  Procedure Laterality Date  . JOINT REPLACEMENT     Patient Active Problem List   Diagnosis Date Noted  . 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 Water intoxication syndrome 07/24/2018  . Seizure (HCC) 07/21/2018  . GERD (gastroesophageal reflux disease) 07/20/2018  . Increased ammonia level 07/09/2018  . Hyponatremia 05/28/2018  . Schizoaffective disorder, bipolar type (HCC) 05/14/2018  . Somnolence 10/05/2017  . Adverse drug interaction with prescription medication 10/05/2017  . Hypertension   . Diabetes mellitus without complication (HCC)   . Bipolar 1 disorder (HCC)   . Seizures (HCC)   . Schizophrenia, acute (HCC) 05/14/2017      Prior to Admission medications   Medication Sig Start Date End Date Taking? Authorizing Provider  benztropine (COGENTIN) 2 MG tablet Take 1 tablet (2 mg total) by mouth 2 (two) times daily. 08/06/18   Clapacs, Jackquline Denmark, MD  clonazePAM (KLONOPIN) 1 MG tablet Take 1 tablet (1 mg total) by  mouth 4 (four) times daily. 08/06/18   Clapacs, Jackquline Denmark, MD  divalproex (DEPAKOTE) 500 MG DR tablet TAKE (1) TABLET BY MOUTH EVERY TWELVE HOURS. 08/14/18   Clapacs, Jackquline Denmark, MD  fluPHENAZine (PROLIXIN) 10 MG tablet Take 1 tablet (10 mg total) by mouth 3 (three) times daily. 08/06/18   Clapacs, Jackquline Denmark, MD  ibuprofen (ADVIL,MOTRIN) 600 MG tablet Take 1 tablet (600 mg total) by mouth every 6 (six) hours as needed for headache or moderate pain. 08/06/18   Clapacs, Jackquline Denmark, MD  insulin detemir (LEVEMIR) 100 UNIT/ML injection Inject 0.28 mLs (28 Units total) into the skin at bedtime. 08/06/18   Clapacs, Jackquline Denmark, MD  lactulose (CHRONULAC) 10 GM/15ML solution Take 15 mLs (10 g total) by mouth 2 (two) times daily. 08/06/18   Clapacs, Jackquline Denmark, MD  levETIRAcetam (KEPPRA) 750 MG tablet TAKE (2) TABLETS BY MOUTH ONCE DAILY. 08/14/18   Clapacs, Jackquline Denmark, MD  pantoprazole (PROTONIX) 40 MG tablet Take 1 tablet (40 mg total) by mouth daily. 08/06/18   Clapacs, Jackquline Denmark, MD  perphenazine (TRILAFON) 8 MG tablet Take 1 tablet (8 mg total) by mouth 3 (three) times daily. 08/06/18   Clapacs, Jackquline Denmark, MD  QUEtiapine (SEROQUEL) 100 MG tablet Take 1 tablet (100 mg total) by mouth every 4 (four) hours as needed (anxiety). 08/06/18   Clapacs, Jackquline Denmark, MD  sodium chloride 1 g tablet Take 1 tablet (1 g total) by mouth daily. 08/06/18   Clapacs,  Jackquline Denmark, MD  TRUE METRIX BLOOD GLUCOSE TEST test strip  08/06/18   [provider]    Allergies Haldol [haloperidol lactate]; Inderal [propranolol]; Januvia [sitagliptin]; Lithium; Prozac [fluoxetine hcl]; and Thorazine [chlorpromazine]    Social History Social History   Tobacco Use  . Smoking status: Never Smoker  . Smokeless tobacco: Never Used  Substance Use Topics  . Alcohol use: No  . Drug use: Yes    Types: Marijuana    Comment: quit 5 years ago    Review of Systems Patient denies headaches, rhinorrhea, blurry vision, numbness, shortness of breath, chest pain, edema, cough,  abdominal pain, nausea, vomiting, diarrhea, dysuria, fevers, rashes or hallucinations unless otherwise stated above in HPI. ____________________________________________   PHYSICAL EXAM:  VITAL SIGNS: Vitals:   09/10/18 1344 09/10/18 1530  BP:  132/87  Pulse:  99  Resp:  20  Temp: 97.9 F (36.6 C) 98.7 F (37.1 C)  SpO2:  98%    Constitutional: Alert and oriented.  Eyes: Conjunctivae are normal.  Head: Atraumatic. Nose: No congestion/rhinnorhea. Mouth/Throat: Mucous membranes are moist.   Neck: No stridor. Painless ROM.  Cardiovascular: Normal rate, regular rhythm. Grossly normal heart sounds.  Good peripheral circulation. Respiratory: Normal respiratory effort.  No retractions. Lungs CTAB. Gastrointestinal: Soft and nontender. No distention. No abdominal bruits. No CVA tenderness. Genitourinary:  Musculoskeletal: No lower extremity tenderness nor edema.  No joint effusions. Neurologic:  Normal speech and language. No gross focal neurologic deficits are appreciated. No facial droop Skin:  Skin is warm, dry and intact. No rash noted. Psychiatric: Mood and affect are normal. Speech and behavior are normal.  ____________________________________________   LABS (all labs ordered are listed, but only abnormal results are displayed)  Results for orders placed or performed during the hospital encounter of 09/10/18 (from the past 24 hour(s))  Influenza panel by PCR (type A & B)     Status: None   Collection Time: 09/10/18  1:49 PM  Result Value Ref Range   Influenza A By PCR NEGATIVE NEGATIVE   Influenza B By PCR NEGATIVE NEGATIVE  Lactic acid, plasma     Status: None   Collection Time: 09/10/18  1:50 PM  Result Value Ref Range   Lactic Acid, Venous 0.8 0.5 - 1.9 mmol/L  Comprehensive metabolic panel     Status: Abnormal   Collection Time: 09/10/18  1:50 PM  Result Value Ref Range   Sodium 127 (L) 135 - 145 mmol/L   Potassium 4.3 3.5 - 5.1 mmol/L   Chloride 91 (L) 98 - 111  mmol/L   CO2 24 22 - 32 mmol/L   Glucose, Bld 120 (H) 70 - 99 mg/dL   BUN 6 6 - 20 mg/dL   Creatinine, Ser 1.61 0.61 - 1.24 mg/dL   Calcium 9.3 8.9 - 09.6 mg/dL   Total Protein 7.7 6.5 - 8.1 g/dL   Albumin 4.5 3.5 - 5.0 g/dL   AST 16 15 - 41 U/L   ALT 10 0 - 44 U/L   Alkaline Phosphatase 46 38 - 126 U/L   Total Bilirubin 0.6 0.3 - 1.2 mg/dL   GFR calc non Af Amer >60 >60 mL/min   GFR calc Af Amer >60 >60 mL/min   Anion gap 12 5 - 15  Troponin I     Status: None   Collection Time: 09/10/18  1:50 PM  Result Value Ref Range   Troponin I <0.03 <0.03 ng/mL  CBC WITH DIFFERENTIAL     Status:  Abnormal   Collection Time: 09/10/18  1:50 PM  Result Value Ref Range   WBC 5.6 4.0 - 10.5 K/uL   RBC 3.95 (L) 4.22 - 5.81 MIL/uL   Hemoglobin 12.5 (L) 13.0 - 17.0 g/dL   HCT 16.1 (L) 09.6 - 04.5 %   MCV 90.4 80.0 - 100.0 fL   MCH 31.6 26.0 - 34.0 pg   MCHC 35.0 30.0 - 36.0 g/dL   RDW 40.9 81.1 - 91.4 %   Platelets 145 (L) 150 - 400 K/uL   nRBC 0.0 0.0 - 0.2 %   Neutrophils Relative % 66 %   Neutro Abs 3.7 1.7 - 7.7 K/uL   Lymphocytes Relative 24 %   Lymphs Abs 1.3 0.7 - 4.0 K/uL   Monocytes Relative 8 %   Monocytes Absolute 0.4 0.1 - 1.0 K/uL   Eosinophils Relative 1 %   Eosinophils Absolute 0.0 0.0 - 0.5 K/uL   Basophils Relative 0 %   Basophils Absolute 0.0 0.0 - 0.1 K/uL   Immature Granulocytes 1 %   Abs Immature Granulocytes 0.03 0.00 - 0.07 K/uL  TSH     Status: None   Collection Time: 09/10/18  1:50 PM  Result Value Ref Range   TSH 2.459 0.350 - 4.500 uIU/mL  Urinalysis, Complete w Microscopic     Status: Abnormal   Collection Time: 09/10/18  2:44 PM  Result Value Ref Range   Color, Urine STRAW (A) YELLOW   APPearance CLEAR (A) CLEAR   Specific Gravity, Urine 1.003 (L) 1.005 - 1.030   pH 6.0 5.0 - 8.0   Glucose, UA NEGATIVE NEGATIVE mg/dL   Hgb urine dipstick NEGATIVE NEGATIVE   Bilirubin Urine NEGATIVE NEGATIVE   Ketones, ur NEGATIVE NEGATIVE mg/dL   Protein, ur  NEGATIVE NEGATIVE mg/dL   Nitrite NEGATIVE NEGATIVE   Leukocytes, UA NEGATIVE NEGATIVE   RBC / HPF 0-5 0 - 5 RBC/hpf   WBC, UA 0-5 0 - 5 WBC/hpf   Bacteria, UA NONE SEEN NONE SEEN   Squamous Epithelial / LPF NONE SEEN 0 - 5   _______________________________________________________________________________________  RADIOLOGY  I personally reviewed all radiographic images ordered to evaluate for the above acute complaints and reviewed radiology reports and findings.  These findings were personally discussed with the patient.  Please see medical record for radiology report.  ____________________________________________   PROCEDURES  Procedure(s) performed:  Procedures    Critical Care performed: no ____________________________________________   INITIAL IMPRESSION / ASSESSMENT AND PLAN / ED COURSE  Pertinent labs & imaging results that were available during my care of the patient were reviewed by me and considered in my medical decision making (see chart for details).   DDX: electrolyte and, sepsis, viral illness, pna, uti  NAI DASCH is a 47 y.o. who presents to the ED with symptoms as described above.  Patient with low-grade oral temperature but core temperature is normal.  Will check blood work based on his chronic medical conditions to evaluate for acute abnormality.  Doubt sepsis.  Clinical Course as of Sep 10 1550  Wed Sep 10, 2018  1503 Core temperature is normal.  No signs or symptoms of sepsis thus far.   [PR]    Clinical Course User Index [PR] Willy Eddy, MD    ----------------------------------------- 3:51 PM on 09/10/2018 -----------------------------------------  Patient vital signs are stable.  No evidence of UTI pneumonia or illness at this time.  Patient stable and appropriate for outpatient follow-up. As part of my medical decision making,  I reviewed the following data within the electronic MEDICAL RECORD NUMBER Nursing notes reviewed and  incorporated, Labs reviewed, notes from prior ED visits and [place] Controlled Substance Database   ____________________________________________   FINAL CLINICAL IMPRESSION(S) / ED DIAGNOSES  Final diagnoses:  Hypothermia, initial encounter  Chronic hyponatremia      NEW MEDICATIONS STARTED DURING THIS VISIT:  New Prescriptions   No medications on file     Note:  This document was prepared using Dragon voice recognition software and may include unintentional dictation errors.    Willy Eddy, MD 09/10/18 [phone]

## 2018-09-10 NOTE — ED Notes (Signed)
Pt given sandwich tray and water 

## 2018-09-10 NOTE — ED Notes (Addendum)
First nurse note: Pt sent from PCP for low temp and pale color. PT with caretaker from [facility] asst living. PT given wheelchair by this RN

## 2018-09-10 NOTE — ED Triage Notes (Signed)
Pt from PCP for routine check up and sent over for low grade temp and pale in color. PT denies any complaints. PT appears pale and oral temp 94.6 PT A&Ox4

## 2018-09-10 NOTE — ED Notes (Signed)
Lawanda from group home contacted and said they would provide cab for pt to go back to facility/home in. Will contact [employer]CP[employer] to seek approval of discharge plans/

## 2018-09-10 NOTE — ED Notes (Signed)
2 attempts at contacting group home, no answer

## 2018-09-10 NOTE — ED Notes (Signed)
I spoke with [name] pt's HCPOA who gave verbal consent for pt to go back via cab and sign for discharge

## 2018-09-10 NOTE — ED Notes (Signed)
Attempted to call care home x 4 times. No answer

## 2018-09-10 NOTE — ED Notes (Addendum)
Spoke with [name], pt's HCPOA and provided him with discharge plans.

## 2018-09-10 NOTE — ED Notes (Signed)
Pt caregiver Jakaiden Fill)  number: [phone]

## 2018-09-10 NOTE — ED Notes (Signed)
Called [facility] and verified a cab would be called for pt. [facility] states they provide payment. Cab service called

## 2018-09-10 NOTE — Discharge Instructions (Signed)
Please make an appointment to follow-up with your primary care physician.  Return to the emergency department if you develop fever or shaking chills, lightheadedness or fainting, pain, inability to keep down fluids, or any other symptoms concerning to you.

## 2018-09-12 LAB — URINE CULTURE: CULTURE: NO GROWTH

## 2018-09-15 LAB — CULTURE, BLOOD (ROUTINE X 2)
Culture: NO GROWTH
Culture: NO GROWTH
Special Requests: ADEQUATE

## 2018-09-17 ENCOUNTER — Emergency Department
Admission: EM | Admit: 2018-09-17 | Discharge: 2018-09-17 | Disposition: A | Discharge status: Home Health | Payer: Medicare Other | Attending: Emergency Medicine | Admitting: Emergency Medicine

## 2018-09-17 ENCOUNTER — Encounter: Payer: Self-pay | Admitting: Emergency Medicine

## 2018-09-17 ENCOUNTER — Other Ambulatory Visit: Payer: Self-pay

## 2018-09-17 DIAGNOSIS — Z794 Long term (current) use of insulin: Secondary | ICD-10-CM | POA: Diagnosis not present

## 2018-09-17 DIAGNOSIS — Z79899 Other long term (current) drug therapy: Secondary | ICD-10-CM | POA: Diagnosis not present

## 2018-09-17 DIAGNOSIS — F79 Unspecified intellectual disabilities: Secondary | ICD-10-CM

## 2018-09-17 DIAGNOSIS — F209 Schizophrenia, unspecified: Secondary | ICD-10-CM | POA: Diagnosis present

## 2018-09-17 DIAGNOSIS — F250 Schizoaffective disorder, bipolar type: Secondary | ICD-10-CM | POA: Diagnosis present

## 2018-09-17 DIAGNOSIS — I10 Essential (primary) hypertension: Secondary | ICD-10-CM | POA: Diagnosis present

## 2018-09-17 DIAGNOSIS — E119 Type 2 diabetes mellitus without complications: Secondary | ICD-10-CM | POA: Diagnosis not present

## 2018-09-17 DIAGNOSIS — G40909 Epilepsy, unspecified, not intractable, without status epilepticus: Secondary | ICD-10-CM

## 2018-09-17 DIAGNOSIS — F319 Bipolar disorder, unspecified: Secondary | ICD-10-CM | POA: Diagnosis present

## 2018-09-17 LAB — BASIC METABOLIC PANEL
ANION GAP: 9 (ref 5–15)
BUN: 9 mg/dL (ref 6–20)
CALCIUM: 9.4 mg/dL (ref 8.9–10.3)
CO2: 24 mmol/L (ref 22–32)
Chloride: 94 mmol/L — ABNORMAL LOW (ref 98–111)
Creatinine, Ser: 0.88 mg/dL (ref 0.61–1.24)
Glucose, Bld: 119 mg/dL — ABNORMAL HIGH (ref 70–99)
Potassium: 4 mmol/L (ref 3.5–5.1)
SODIUM: 127 mmol/L — AB (ref 135–145)

## 2018-09-17 LAB — VALPROIC ACID LEVEL: VALPROIC ACID LVL: 78 ug/mL (ref 50.0–100.0)

## 2018-09-17 LAB — GLUCOSE, CAPILLARY: GLUCOSE-CAPILLARY: 169 mg/dL — AB (ref 70–99)

## 2018-09-17 NOTE — ED Notes (Signed)

## 2018-09-17 NOTE — ED Notes (Signed)
Pt given meal tray.

## 2018-09-17 NOTE — ED Triage Notes (Signed)
Pt in via [facility] from [facility].  Pt doesn't have any complaints, when asked, pt states, "I need to be checked out again. I couldn't get anybody to bring me from the group home."  Pt eating a bag of pretzels and drinking [facility] even after being asked to not have anything to drink so that we could get his temperature.  Pt rude in triage, uncooperative, states, "I'm just mad" but does not going into any further details.    This RN attempted to contact pt's legal guardian, Nelle Don at [phone], unsuccessful at this time, voicemail left.

## 2018-09-17 NOTE — ED Notes (Signed)
Attempted to take oral temperature. When informing pt that I was about to take temperature, pt started taking lid off of mt dew bottle. Pt asked to wait until after temperature was taken to drink. Pt proceeded to start drinking prior to taking temp. Axillary temp taken due to pt not cooperating for oral temp

## 2018-09-17 NOTE — Discharge Instructions (Addendum)

## 2018-09-17 NOTE — ED Notes (Signed)
BEHAVIORAL HEALTH ROUNDING Patient sleeping: No. Patient alert and oriented: yes Behavior appropriate: Yes.  ; If no, describe:  Nutrition and fluids offered: yes Toileting and hygiene offered: Yes  Sitter present: q15 minute observations and security  monitoring Law enforcement present: Yes  ODS  

## 2018-09-17 NOTE — Consult Note (Signed)
[facility] Face-to-Face Psychiatry Consult   Reason for Consult: Consult for this 47 year old man with a history of schizoaffective disorder and intellectual disability a neurologic problems and multiple medical issues who came into the hospital for somewhat unclear reasons Referring Physician: Quale Patient Identification: Oscar Duke MRN:  [id number] Principal Diagnosis: Schizoaffective disorder, bipolar type [facility]) Diagnosis:   Patient Active Problem List   Diagnosis Date Noted  . 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 Seizure (HCC) [R56.9] 07/21/2018  . GERD (gastroesophageal reflux disease) [K21.9] 07/20/2018  . Increased ammonia level [R79.89] 07/09/2018  . Hyponatremia [E87.1] 05/28/2018  . Schizoaffective disorder, bipolar type (HCC) [F25.0] 05/14/2018  . Somnolence [R40.0] 10/05/2017  . Adverse drug interaction with prescription medication [T50.905A] 10/05/2017  . Hypertension [I10]   . Diabetes mellitus without complication (HCC) [E11.9]   . Bipolar 1 disorder (HCC) [F31.9]   . Seizures (HCC) [R56.9]   . Schizophrenia, acute (HCC) [F23] 05/14/2017    Total Time spent with patient: 1 hour  Subjective:   Oscar Duke is a 47 y.o. male patient admitted with "I have not been feeling good".  HPI: Patient seen chart reviewed.  I am familiar with the patient from many previous encounters.  47 year old man with chronic mental illness who lives in a group home.  Came into the emergency room today with rather vague complaints.  He is been uncooperative but not aggressive since he has been here.  On interview he tells me that this afternoon he fell down while walking outside and fell into a fence.  I tried to get him to elaborate on that but he immediately became tangential and would not give me any details instead just rambling about how he does not like his group home and would like to go back to  [place].  Patient is alert but seems a little bit more out of it than usual.  He has been here many times before but when I first ask he told me he thought he was in a mental health facility in [place].  After I told him he was in [place] he was able to remember it.  He looks slightly unsteady on his feet and a little bit tremulous.  He denies suicidal or homicidal ideation denies that he is having any hallucinations now.  Social history: Patient does have a legal guardian.  He resides in a group home.  He is only been up in our part of the [place] a fairly short time.  Does not particularly like it and has a habit of coming to the emergency room frequently  Medical history: He has a history of seizures intellectual disability possibly related to some brain injury in the past.  History of gastric reflux history of diabetes.  He has presented multiple times in the past with hyponatremia and while the etiology has not been completely clear I suspect a lot of it is due to water intoxication.  Substance abuse history: Fortunately, none  Past Psychiatric History: Patient has a history of schizoaffective disorder as well as intellectual disability.  Multiple hospitalizations in the past.  From time to time he does have a history of becoming agitated and aggressive.  Medications help to keep that calm down quite a bit.  In my experience it has always been a delicate balance between enough medicine to keep him from getting agitated and aggressive but not so much that it makes him somnolent.  Risk to  Self:   Risk to Others:   Prior Inpatient Therapy:   Prior Outpatient Therapy:    Past Medical History:  Past Medical History:  Diagnosis Date  . Bipolar 1 disorder (HCC)   . Diabetes mellitus without complication (HCC)   . Hyperlipidemia   . Hypertension   . 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 Schizophrenia, acute (HCC)   . Seizures (HCC)     Past Surgical History:   Procedure Laterality Date  . JOINT REPLACEMENT     Family History: No family history on file. Family Psychiatric  History: Unknown Social History:  Social History   Substance and Sexual Activity  Alcohol Use No     Social History   Substance and Sexual Activity  Drug Use Yes  . Types: Marijuana   Comment: quit 5 years ago    Social History   Socioeconomic History  . Marital status: Single    Spouse name: Not on file  . Number of children: Not on file  . Years of education: Not on file  . Highest education level: Not on file  Occupational History  . Not on file  Social Needs  . Financial resource strain: Not on file  . Food insecurity:    Worry: Not on file    Inability: Not on file  . Transportation needs:    Medical: Not on file    Non-medical: Not on file  Tobacco Use  . Smoking status: Never Smoker  . Smokeless tobacco: Never Used  Substance and Sexual Activity  . Alcohol use: No  . Drug use: Yes    Types: Marijuana    Comment: quit 5 years ago  . Sexual activity: Not on file  Lifestyle  . Physical activity:    Days per week: Not on file    Minutes per session: Not on file  . Stress: Not on file  Relationships  . Social connections:    Talks on phone: Not on file    Gets together: Not on file    Attends religious service: Not on file    Active member of club or organization: Not on file    Attends meetings of clubs or organizations: Not on file    Relationship status: Not on file  Other Topics Concern  . Not on file  Social History Narrative  . Not on file   Additional Social History:    Allergies:   Allergies  Allergen Reactions  . Haldol [Haloperidol Lactate] Other (See Comments)    Altered Mental Status   . Inderal [Propranolol]   . Lithium Other (See Comments)    Seizures  . Prozac [Fluoxetine Hcl]   . Thorazine [Chlorpromazine]   . Januvia [Sitagliptin] Other (See Comments)    Skin discoloration on lower legs    Labs:   Results for orders placed or performed during the hospital encounter of 09/17/18 (from the past 48 hour(s))  Glucose, capillary     Status: Abnormal   Collection Time: 09/17/18  4:30 PM  Result Value Ref Range   Glucose-Capillary 169 (H) 70 - 99 mg/dL    No current facility-administered medications for this encounter.    Current Outpatient Medications  Medication Sig Dispense Refill  . benztropine (COGENTIN) 2 MG tablet Take 1 tablet (2 mg total) by mouth 2 (two) times daily. 60 tablet 0  . clonazePAM (KLONOPIN) 1 MG tablet Take 1 tablet (1 mg total) by mouth 4 (four) times daily. 120 tablet 0  .  divalproex (DEPAKOTE) 500 MG DR tablet TAKE (1) TABLET BY MOUTH EVERY TWELVE HOURS. 16 tablet 0  . fluPHENAZine (PROLIXIN) 10 MG tablet Take 1 tablet (10 mg total) by mouth 3 (three) times daily. 90 tablet 0  . ibuprofen (ADVIL,MOTRIN) 600 MG tablet Take 1 tablet (600 mg total) by mouth every 6 (six) hours as needed for headache or moderate pain. 30 tablet 0  . insulin detemir (LEVEMIR) 100 UNIT/ML injection Inject 0.28 mLs (28 Units total) into the skin at bedtime. 10 mL 11  . lactulose (CHRONULAC) 10 GM/15ML solution Take 15 mLs (10 g total) by mouth 2 (two) times daily. 600 mL 0  . levETIRAcetam (KEPPRA) 750 MG tablet TAKE (2) TABLETS BY MOUTH ONCE DAILY. 16 tablet 0  . pantoprazole (PROTONIX) 40 MG tablet Take 1 tablet (40 mg total) by mouth daily. 30 tablet 0  . perphenazine (TRILAFON) 8 MG tablet Take 1 tablet (8 mg total) by mouth 3 (three) times daily. 90 tablet 0  . QUEtiapine (SEROQUEL) 100 MG tablet Take 1 tablet (100 mg total) by mouth every 4 (four) hours as needed (anxiety). 60 tablet 0  . sodium chloride 1 g tablet Take 1 tablet (1 g total) by mouth daily. 30 tablet 0  . TRUE METRIX BLOOD GLUCOSE TEST test strip   5    Musculoskeletal: Strength & Muscle Tone: within normal limits Gait & Station: unsteady Patient leans: N/A  Psychiatric Specialty Exam: Physical Exam  Nursing  note and vitals reviewed. Constitutional: He appears well-developed and well-nourished.  HENT:  Head: Normocephalic and atraumatic.  Eyes: Pupils are equal, round, and reactive to light. Conjunctivae are normal.  Neck: Normal range of motion.  Cardiovascular: Regular rhythm and normal heart sounds.  Respiratory: Effort normal. No respiratory distress.  GI: Soft.  Musculoskeletal: Normal range of motion.  Neurological: He is alert.  Skin: Skin is warm and dry.  Psychiatric: His affect is blunt. His speech is delayed. He is slowed. Thought content is not paranoid. Cognition and memory are impaired. He expresses impulsivity and inappropriate judgment. He expresses no homicidal and no suicidal ideation. He exhibits abnormal recent memory.    Review of Systems  Constitutional: Negative.   HENT: Negative.   Eyes: Negative.   Respiratory: Negative.   Cardiovascular: Negative.   Gastrointestinal: Negative.   Musculoskeletal: Negative.   Skin: Negative.   Neurological: Negative.   Psychiatric/Behavioral: Negative.     Blood pressure 114/76, pulse 100, temperature 97.7 F (36.5 C), temperature source Axillary, resp. rate 18, height 6\' 4"  (1.93 m), weight 113.4 kg, SpO2 97 %.Body mass index is 30.43 kg/m.  General Appearance: Disheveled  Eye Contact:  Fair  Speech:  Slow  Volume:  Decreased  Mood:  Dysphoric  Affect:  Constricted  Thought Process:  Goal Directed  Orientation:  Full (Time, Place, and Person)  Thought Content:  Rumination and Tangential  Suicidal Thoughts:  No  Homicidal Thoughts:  No  Memory:  Immediate;   Fair Recent;   Fair Remote;   Poor  Judgement:  Impaired  Insight:  Shallow  Psychomotor Activity:  Restlessness  Concentration:  Concentration: Poor  Recall:  [employer] of Knowledge:  Fair  Language:  Fair  Akathisia:  No  Handed:  Right  AIMS (if indicated):     Assets:  Housing Social Support  ADL's:  Impaired  Cognition:  Impaired,  Mild  Sleep:         Treatment Plan Summary: Plan 47 year old man with multiple  problems as described above.  He never did come up with a chief complaint in talking with me.  On review of possible complaints he denies any suicidal or homicidal ideation denies that he is having psychotic symptoms.  He has not been aggressive since he has been here but he is restless and at times uncooperative.  Based on my experience of him he is a little bit more confused than baseline but not extremely so.  Based on his prior presentations I do suggest that we check at least a basic metabolic panel and a Depakote level before deciding he can be released from the emergency room.  He does not at this point require psychiatric hospitalization.  As long as his Depakote level is okay he does not require any adjustment to his medicine today.  He has an act team in the community that follows up with him regularly.  Case reviewed with emergency room physician.  Disposition: No evidence of imminent risk to self or others at present.   Patient does not meet criteria for psychiatric inpatient admission. Supportive therapy provided about ongoing stressors.  Mordecai Rasmussen, MD 09/17/2018 4:44 PM

## 2018-09-17 NOTE — ED Provider Notes (Signed)
[facility] Emergency Department Provider Note   ____________________________________________   First MD Initiated Contact with Patient 09/17/18 1508     (approximate)  I have reviewed the triage vital signs and the nursing notes.   HISTORY  Chief Complaint Other  EM cavity: Patient seems to have somewhat poor cognition and memory,  HPI Oscar Duke is a 47 y.o. male with a history of diabetes, bipolar disorder, schizophrenia and seizures  Much of the history is obtained from patient's legal guardian, [name] who I spoke to via phone.  Patient himself reports that he is here because he is been upset that he does not have an extra controller for his PlayStation device.  He had breakfast of bacon and eggs this morning, and would like something for lunch.  Patient has no other complaints at this time.  No fevers or chills.  Denies any recent illness.  Feels well otherwise, does report he has not had to go to sleep in the last several nights.  Specifically denies desire to harm himself or anyone else.   Past Medical History:  Diagnosis Date  . Bipolar 1 disorder (HCC)   . Diabetes mellitus without complication (HCC)   . Hyperlipidemia   . Hypertension   . 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 Schizophrenia, acute (HCC)   . Seizures [facility])     Patient Active Problem List   Diagnosis Date Noted  . 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 Water intoxication syndrome 07/24/2018  . Seizure (HCC) 07/21/2018  . GERD (gastroesophageal reflux disease) 07/20/2018  . Increased ammonia level 07/09/2018  . Hyponatremia 05/28/2018  . Schizoaffective disorder, bipolar type (HCC) 05/14/2018  . Somnolence 10/05/2017  . Adverse drug interaction with prescription medication 10/05/2017  . Hypertension   . Diabetes mellitus without complication (HCC)   . Bipolar 1 disorder (HCC)   . Seizures (HCC)   . Schizophrenia, acute  (HCC) 05/14/2017    Past Surgical History:  Procedure Laterality Date  . JOINT REPLACEMENT      Prior to Admission medications   Medication Sig Start Date End Date Taking? Authorizing Provider  acetaminophen (TYLENOL) 500 MG tablet Take 500 mg by mouth every 6 (six) hours as needed for mild pain, moderate pain or fever.   Yes [provider]  benztropine (COGENTIN) 2 MG tablet Take 1 tablet (2 mg total) by mouth 2 (two) times daily. 08/06/18  Yes Clapacs, Jackquline Denmark, MD  cholecalciferol (VITAMIN D) 25 MCG (1000 UT) tablet Take 2,000 Units by mouth daily.   Yes [provider]  clonazePAM (KLONOPIN) 1 MG tablet Take 1 tablet (1 mg total) by mouth 4 (four) times daily. 08/06/18  Yes Clapacs, Jackquline Denmark, MD  divalproex (DEPAKOTE) 500 MG DR tablet TAKE (1) TABLET BY MOUTH EVERY TWELVE HOURS. Patient taking differently: Take 500 mg by mouth 2 (two) times daily.  08/14/18  Yes Clapacs, Jackquline Denmark, MD  fluPHENAZine (PROLIXIN) 10 MG tablet Take 1 tablet (10 mg total) by mouth 3 (three) times daily. 08/06/18  Yes Clapacs, Jackquline Denmark, MD  ibuprofen (ADVIL,MOTRIN) 600 MG tablet Take 1 tablet (600 mg total) by mouth every 6 (six) hours as needed for headache or moderate pain. 08/06/18  Yes Clapacs, Jackquline Denmark, MD  insulin aspart (NOVOLOG) 100 UNIT/ML injection Inject 0-15 Units into the skin 3 (three) times daily.   Yes [provider]  insulin detemir (LEVEMIR) 100 UNIT/ML injection Inject 0.28 mLs (28 Units total)  into the skin at bedtime. 08/06/18  Yes Clapacs, Jackquline Denmark, MD  lactulose (CHRONULAC) 10 GM/15ML solution Take 15 mLs (10 g total) by mouth 2 (two) times daily. 08/06/18  Yes Clapacs, Jackquline Denmark, MD  levETIRAcetam (KEPPRA) 750 MG tablet TAKE (2) TABLETS BY MOUTH ONCE DAILY. Patient taking differently: Take 750 mg by mouth 2 (two) times daily.  08/14/18  Yes Clapacs, Jackquline Denmark, MD  pantoprazole (PROTONIX) 40 MG tablet Take 1 tablet (40 mg total) by mouth daily. 08/06/18  Yes Clapacs, Jackquline Denmark, MD    perphenazine (TRILAFON) 8 MG tablet Take 1 tablet (8 mg total) by mouth 3 (three) times daily. 08/06/18  Yes Clapacs, Jackquline Denmark, MD  QUEtiapine (SEROQUEL) 100 MG tablet Take 1 tablet (100 mg total) by mouth every 4 (four) hours as needed (anxiety). 08/06/18  Yes Clapacs, Jackquline Denmark, MD  sodium chloride 1 g tablet Take 1 tablet (1 g total) by mouth daily. 08/06/18  Yes Clapacs, Jackquline Denmark, MD  traZODone (DESYREL) 150 MG tablet Take 150 mg by mouth at bedtime.   Yes [provider]  TRUE METRIX BLOOD GLUCOSE TEST test strip  08/06/18   [provider]    Allergies Haldol [haloperidol lactate]; Inderal [propranolol]; Lithium; Prozac [fluoxetine hcl]; Thorazine [chlorpromazine]; and Januvia [sitagliptin]  No family history on file.  Social History Social History   Tobacco Use  . Smoking status: Never Smoker  . Smokeless tobacco: Never Used  Substance Use Topics  . Alcohol use: No  . Drug use: Yes    Types: Marijuana    Comment: quit 5 years ago    Review of Systems Constitutional: No fever/chills denies recent illness Eyes: No visual changes. ENT: No sore throat.  Reports he is hungry. Cardiovascular: Denies chest pain. Respiratory: Denies shortness of breath. Gastrointestinal: No abdominal pain.   Skin: Negative for rash. Neurological: Negative for headaches or weakness.   ____________________________________________   PHYSICAL EXAM:  VITAL SIGNS: ED Triage Vitals  Enc Vitals Group     BP 09/17/18 1444 114/76     Pulse Rate 09/17/18 1444 100     Resp 09/17/18 1444 18     Temp 09/17/18 1525 97.7 F (36.5 C)     Temp Source 09/17/18 1525 Axillary     SpO2 09/17/18 1444 97 %     Weight 09/17/18 1436 250 lb (113.4 kg)     Height 09/17/18 1436 6\' 4"  (1.93 m)     Head Circumference --      Peak Flow --      Pain Score 09/17/18 1436 0     Pain Loc --      Pain Edu? --      Excl. in GC? --     Constitutional: Alert and oriented. Well appearing and in no acute  distress.  He is able to stand and walk independently without distress. Eyes: Conjunctivae are normal. Head: Atraumatic. Nose: No congestion/rhinnorhea. Mouth/Throat: Mucous membranes are moist. Neck: No stridor.  Cardiovascular: Normal rate, regular rhythm. Grossly normal heart sounds.  Good peripheral circulation. Respiratory: Normal respiratory effort.  No retractions. Lungs CTAB. Gastrointestinal: Soft and nontender. No distention. Musculoskeletal: No lower extremity tenderness nor edema. Neurologic:  Normal speech and language. No gross focal neurologic deficits are appreciated.  Skin:  Skin is warm, dry and intact. No rash noted. Psychiatric: Mood and affect are somewhat flat at present, nursing note does report he was easily angered at the time of triage. Speech and behavior are normal.  Denies desire to harm himself or anyone else.  Denies overdose or ingestion.  ____________________________________________   LABS (all labs ordered are listed, but only abnormal results are displayed)  Labs Reviewed  GLUCOSE, CAPILLARY - Abnormal; Notable for the following components:      Result Value   Glucose-Capillary 169 (*)    All other components within normal limits  BASIC METABOLIC PANEL - Abnormal; Notable for the following components:   Sodium 127 (*)    Chloride 94 (*)    Glucose, Bld 119 (*)    All other components within normal limits  VALPROIC ACID LEVEL  CBG MONITORING, ED   ____________________________________________  EKG   ____________________________________________  RADIOLOGY   ____________________________________________   PROCEDURES  Procedure(s) performed: None  Procedures  Critical Care performed: No  ____________________________________________   INITIAL IMPRESSION / ASSESSMENT AND PLAN / ED COURSE  Pertinent labs & imaging results that were available during my care of the patient were reviewed by me and considered in my medical decision  making (see chart for details).   Patient presents for evaluation for increased agitation over the last few days.  He does have a history of known psychiatric disease, guardian reports agitation slightly more than normal over the last few days.  Patient currently calm, no distress ambulatory and provided a meal.  He has no acute medical complaint is vital signs are normal.  Will place a consult to psychiatry given the underlying chief complaint and discussion with guardian, but at this time will not place patient under IVC as he does not exhibit any symptoms of acute psychosis or self-injurious behavior or desire for harming anyone else.  Clinical Course as of Sep 17 1937  Wed Sep 17, 2018  1510 DIscussed with legal guardian [name]), reports he was sent as patient was getting upset more easily and less sleep last few days. Notes that happens often and sometimes patient needs psych meds adjusted. This occurs about 1/month and last few days has seemed more upset easily and "irrate" at times. No threats to hurt self, overdose, or injure anyone. Thinks patient should have a psych consult again.    [MQ]    Clinical Course User Index [MQ] Sharyn Creamer, MD   ----------------------------------------- 7:38 PM on 09/17/2018 -----------------------------------------  Patient ambulatory, no distress.  Has eaten well, awake and alert.  Lab work reassuring, chronic hyponatremia.  Patient being discharged back to his group home.  Cleared for discharge by Dr. Toni Amend  ____________________________________________   FINAL CLINICAL IMPRESSION(S) / ED DIAGNOSES  Final diagnoses:  Schizophrenia, unspecified type (HCC)        Note:  This document was prepared using Dragon voice recognition software and may include unintentional dictation errors       Sharyn Creamer, MD 09/17/18 1939

## 2018-09-17 NOTE — ED Notes (Signed)
Pt denies SI/HI/AVH. Pt given discharge instructions including f/u appointments. Pt states understanding. Pt states receipt of all belongings.   

## 2018-09-17 NOTE — ED Notes (Signed)
Patient is stumbling around waiting room.i asked him to get in wheelchair and he did for a short time.  When I asked him questions he said not to aggravate him.   Then he got up walking.

## 2018-10-06 ENCOUNTER — Other Ambulatory Visit: Payer: Self-pay | Admitting: Psychiatry

## 2018-10-13 ENCOUNTER — Other Ambulatory Visit: Payer: Self-pay | Admitting: Psychiatry

## 2018-12-08 ENCOUNTER — Ambulatory Visit: Payer: Medicare Other | Admitting: Podiatry

## 2018-12-28 ENCOUNTER — Emergency Department
Admission: EM | Admit: 2018-12-28 | Discharge: 2018-12-29 | Disposition: A | Discharge status: Home / Self Care | Payer: Medicare Other | Attending: Emergency Medicine | Admitting: Emergency Medicine

## 2018-12-28 ENCOUNTER — Encounter: Payer: Self-pay | Admitting: Emergency Medicine

## 2018-12-28 ENCOUNTER — Other Ambulatory Visit: Payer: Self-pay

## 2018-12-28 ENCOUNTER — Emergency Department: Payer: Medicare Other

## 2018-12-28 DIAGNOSIS — B349 Viral infection, unspecified: Secondary | ICD-10-CM

## 2018-12-28 DIAGNOSIS — I10 Essential (primary) hypertension: Secondary | ICD-10-CM | POA: Diagnosis not present

## 2018-12-28 DIAGNOSIS — F259 Schizoaffective disorder, unspecified: Secondary | ICD-10-CM

## 2018-12-28 DIAGNOSIS — F209 Schizophrenia, unspecified: Secondary | ICD-10-CM | POA: Insufficient documentation

## 2018-12-28 DIAGNOSIS — Z79899 Other long term (current) drug therapy: Secondary | ICD-10-CM | POA: Diagnosis not present

## 2018-12-28 DIAGNOSIS — E119 Type 2 diabetes mellitus without complications: Secondary | ICD-10-CM | POA: Diagnosis not present

## 2018-12-28 DIAGNOSIS — R456 Violent behavior: Secondary | ICD-10-CM | POA: Diagnosis present

## 2018-12-28 DIAGNOSIS — Z794 Long term (current) use of insulin: Secondary | ICD-10-CM | POA: Insufficient documentation

## 2018-12-28 LAB — URINALYSIS, COMPLETE (UACMP) WITH MICROSCOPIC
Bacteria, UA: NONE SEEN
Bilirubin Urine: NEGATIVE
GLUCOSE, UA: NEGATIVE mg/dL
KETONES UR: NEGATIVE mg/dL
Leukocytes,Ua: NEGATIVE
NITRITE: NEGATIVE
PROTEIN: NEGATIVE mg/dL
Specific Gravity, Urine: 1.003 — ABNORMAL LOW (ref 1.005–1.030)
pH: 8 (ref 5.0–8.0)

## 2018-12-28 LAB — COMPREHENSIVE METABOLIC PANEL
ALT: 15 U/L (ref 0–44)
ANION GAP: 7 (ref 5–15)
AST: 29 U/L (ref 15–41)
Albumin: 4.4 g/dL (ref 3.5–5.0)
Alkaline Phosphatase: 52 U/L (ref 38–126)
BUN: 5 mg/dL — AB (ref 6–20)
CHLORIDE: 95 mmol/L — AB (ref 98–111)
CO2: 30 mmol/L (ref 22–32)
Calcium: 9.1 mg/dL (ref 8.9–10.3)
Creatinine, Ser: 0.74 mg/dL (ref 0.61–1.24)
GFR calc Af Amer: >60 mL/min (ref >60)
GFR calc non Af Amer: >60 mL/min (ref >60)
GLUCOSE: 118 mg/dL — AB (ref 70–99)
Potassium: 3.5 mmol/L (ref 3.5–5.1)
SODIUM: 132 mmol/L — AB (ref 135–145)
TOTAL PROTEIN: 7.3 g/dL (ref 6.5–8.1)
Total Bilirubin: 0.6 mg/dL (ref 0.3–1.2)

## 2018-12-28 LAB — CBC WITH DIFFERENTIAL/PLATELET
ABS IMMATURE GRANULOCYTES: 0.03 10*3/uL (ref 0.00–0.07)
BASOS ABS: 0 10*3/uL (ref 0.0–0.1)
Basophils Relative: 0 %
EOS ABS: 0.1 10*3/uL (ref 0.0–0.5)
Eosinophils Relative: 1 %
HEMATOCRIT: 32.9 % — AB (ref 39.0–52.0)
Hemoglobin: 11.3 g/dL — ABNORMAL LOW (ref 13.0–17.0)
IMMATURE GRANULOCYTES: 0 %
LYMPHS ABS: 1.4 10*3/uL (ref 0.7–4.0)
Lymphocytes Relative: 13 %
MCH: 30.3 pg (ref 26.0–34.0)
MCHC: 34.3 g/dL (ref 30.0–36.0)
MCV: 88.2 fL (ref 80.0–100.0)
MONOS PCT: 9 %
Monocytes Absolute: 1 10*3/uL (ref 0.1–1.0)
NEUTROS ABS: 7.9 10*3/uL — AB (ref 1.7–7.7)
NEUTROS PCT: 77 %
NRBC: 0 % (ref 0.0–0.2)
PLATELETS: 162 10*3/uL (ref 150–400)
RBC: 3.73 MIL/uL — ABNORMAL LOW (ref 4.22–5.81)
RDW: 14.3 % (ref 11.5–15.5)
WBC: 10.4 10*3/uL (ref 4.0–10.5)

## 2018-12-28 LAB — ETHANOL

## 2018-12-28 LAB — URINE DRUG SCREEN, QUALITATIVE (ARMC ONLY)
Amphetamines, Ur Screen: NOT DETECTED
BENZODIAZEPINE, UR SCRN: NOT DETECTED
Barbiturates, Ur Screen: NOT DETECTED
CANNABINOID 50 NG, UR ~~LOC~~: NOT DETECTED
Cocaine Metabolite,Ur ~~LOC~~: NOT DETECTED
MDMA (Ecstasy)Ur Screen: NOT DETECTED
Methadone Scn, Ur: NOT DETECTED
Opiate, Ur Screen: NOT DETECTED
PHENCYCLIDINE (PCP) UR S: NOT DETECTED
TRICYCLIC, UR SCREEN: POSITIVE — AB

## 2018-12-28 LAB — LACTIC ACID, PLASMA: Lactic Acid, Venous: 1.3 mmol/L (ref 0.5–1.9)

## 2018-12-28 LAB — PROTIME-INR
INR: 0.99
Prothrombin Time: 13 seconds (ref 11.4–15.2)

## 2018-12-28 MED ORDER — SODIUM CHLORIDE 0.9 % IV BOLUS
1000.0000 mL | Freq: Once | INTRAVENOUS | Status: AC
  Administered 2018-12-28: 1000 mL via INTRAVENOUS

## 2018-12-28 MED ORDER — ACETAMINOPHEN 500 MG PO TABS
1000.0000 mg | ORAL_TABLET | Freq: Once | ORAL | Status: AC
  Administered 2018-12-28: 1000 mg via ORAL
  Filled 2018-12-28: qty 2

## 2018-12-28 MED ORDER — SODIUM CHLORIDE 0.9% FLUSH: 3.0000 mL | Freq: Once | INTRAVENOUS | Status: DC

## 2018-12-28 NOTE — ED Provider Notes (Signed)
[facility] Emergency Department Provider Note   ____________________________________________   I have reviewed the triage vital signs and the nursing notes.   HISTORY  Chief Complaint Wants to leave his group home  History limited by: Poor historian   HPI Oscar Duke is a 48 y.o. male who presents to the emergency department today from a group facility because of concerns for  aggressive behavior.  The patient himself states that he wants to leave the group home he is currently in.  He states he wants to go back to [place].  Patient is a poor historian so history is limited.  He denies feeling poorly today.  Per medical record review patient has a history of DM, HLD, HTN, schizophrenia.   Past Medical History:  Diagnosis Date  . Bipolar 1 disorder (HCC)   . Diabetes mellitus without complication (HCC)   . Hyperlipidemia   . Hypertension   . 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 Schizophrenia, acute (HCC)   . Seizures [facility](HCC)     Patient Active Problem List   Diagnosis Date Noted  . 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 Water intoxication syndrome 07/24/2018  . Seizure (HCC) 07/21/2018  . GERD (gastroesophageal reflux disease) 07/20/2018  . Increased ammonia level 07/09/2018  . Hyponatremia 05/28/2018  . Schizoaffective disorder, bipolar type (HCC) 05/14/2018  . Somnolence 10/05/2017  . Adverse drug interaction with prescription medication 10/05/2017  . Hypertension   . Diabetes mellitus without complication (HCC)   . Bipolar 1 disorder (HCC)   . Seizures (HCC)   . Schizophrenia, acute (HCC) 05/14/2017    Past Surgical History:  Procedure Laterality Date  . JOINT REPLACEMENT      Prior to Admission medications   Medication Sig Start Date End Date Taking? Authorizing Provider  acetaminophen (TYLENOL) 500 MG tablet Take 500 mg by mouth every 6 (six) hours as needed for mild pain, moderate pain or  fever.    [provider]  benztropine (COGENTIN) 2 MG tablet Take 1 tablet (2 mg total) by mouth 2 (two) times daily. 08/06/18   Clapacs, Jackquline DenmarkJohn T, MD  cholecalciferol (VITAMIN D) 25 MCG (1000 UT) tablet Take 2,000 Units by mouth daily.    [provider]  clonazePAM (KLONOPIN) 1 MG tablet Take 1 tablet (1 mg total) by mouth 4 (four) times daily. 08/06/18   Clapacs, Jackquline DenmarkJohn T, MD  divalproex (DEPAKOTE) 500 MG DR tablet TAKE (1) TABLET BY MOUTH EVERY TWELVE HOURS. Patient taking differently: Take 500 mg by mouth 2 (two) times daily.  08/14/18   Clapacs, Jackquline DenmarkJohn T, MD  fluPHENAZine (PROLIXIN) 10 MG tablet Take 1 tablet (10 mg total) by mouth 3 (three) times daily. 08/06/18   Clapacs, Jackquline DenmarkJohn T, MD  ibuprofen (ADVIL,MOTRIN) 600 MG tablet Take 1 tablet (600 mg total) by mouth every 6 (six) hours as needed for headache or moderate pain. 08/06/18   Clapacs, Jackquline DenmarkJohn T, MD  insulin aspart (NOVOLOG) 100 UNIT/ML injection Inject 0-15 Units into the skin 3 (three) times daily.    [provider]  insulin detemir (LEVEMIR) 100 UNIT/ML injection Inject 0.28 mLs (28 Units total) into the skin at bedtime. 08/06/18   Clapacs, Jackquline DenmarkJohn T, MD  lactulose (CHRONULAC) 10 GM/15ML solution Take 15 mLs (10 g total) by mouth 2 (two) times daily. 08/06/18   Clapacs, Jackquline DenmarkJohn T, MD  levETIRAcetam (KEPPRA) 750 MG tablet TAKE (2) TABLETS BY MOUTH ONCE DAILY. Patient taking differently: Take 750 mg by  mouth 2 (two) times daily.  08/14/18   Clapacs, Jackquline Denmark, MD  pantoprazole (PROTONIX) 40 MG tablet Take 1 tablet (40 mg total) by mouth daily. 08/06/18   Clapacs, Jackquline Denmark, MD  perphenazine (TRILAFON) 8 MG tablet Take 1 tablet (8 mg total) by mouth 3 (three) times daily. 08/06/18   Clapacs, Jackquline Denmark, MD  QUEtiapine (SEROQUEL) 100 MG tablet Take 1 tablet (100 mg total) by mouth every 4 (four) hours as needed (anxiety). 08/06/18   Clapacs, Jackquline Denmark, MD  sodium chloride 1 g tablet Take 1 tablet (1 g total) by mouth daily. 08/06/18   Clapacs, Jackquline Denmark,  MD  traZODone (DESYREL) 150 MG tablet Take 150 mg by mouth at bedtime.    [provider]  TRUE METRIX BLOOD GLUCOSE TEST test strip  08/06/18   [provider]    Allergies Haldol [haloperidol lactate]; Inderal [propranolol]; Lithium; Prozac [fluoxetine hcl]; Thorazine [chlorpromazine]; and Januvia [sitagliptin]  No family history on file.  Social History Social History   Tobacco Use  . Smoking status: Never Smoker  . Smokeless tobacco: Never Used  Substance Use Topics  . Alcohol use: No  . Drug use: Yes    Types: Marijuana    Comment: quit 5 years ago    Review of Systems Constitutional: No fever/chills Eyes: No visual changes. ENT: No sore throat. Cardiovascular: Denies chest pain. Respiratory: Denies shortness of breath. Gastrointestinal: No abdominal pain.  No nausea, no vomiting.  No diarrhea.   Genitourinary: Negative for dysuria. Musculoskeletal: Negative for back pain. Skin: Negative for rash. Neurological: Negative for headaches, focal weakness or numbness.  ____________________________________________   PHYSICAL EXAM:  VITAL SIGNS: ED Triage Vitals  Enc Vitals Group     BP 12/28/18 1910 (!) 142/106     Pulse Rate 12/28/18 1910 (!) 123     Resp 12/28/18 1910 (!) 22     Temp 12/28/18 1910 (!) 100.6 F (38.1 C)     Temp Source 12/28/18 1910 Oral     SpO2 12/28/18 1910 100 %     Weight 12/28/18 1912 (!) 324 lb (147 kg)     Height 12/28/18 1912 6\' 6"  (1.981 m)    Constitutional: Awake and alert.  Eyes: Conjunctivae are normal.  ENT      Head: Normocephalic and atraumatic.      Nose: No congestion/rhinnorhea.      Mouth/Throat: Mucous membranes are moist.      Neck: No stridor. Hematological/Lymphatic/Immunilogical: No cervical lymphadenopathy. Cardiovascular: Tachycardic, regular rhythm.  No murmurs, rubs, or gallops.  Respiratory: Normal respiratory effort without tachypnea nor retractions. Breath sounds are clear and equal  bilaterally. No wheezes/rales/rhonchi. Gastrointestinal: Soft and non tender. No rebound. No guarding.  Genitourinary: Deferred Musculoskeletal: Normal range of motion in all extremities. No lower extremity edema. Neurologic:  Awake and alert. Poor historian. Moving all extremities.  Skin:  Skin is warm, dry and intact. No rash noted.  ____________________________________________    LABS (pertinent positives/negatives)  UDS tricyclic positive UA clear, small hgb dipstick Lactic acid 1.3 Ethanol <10 CBC wbc 10.4, hgb 11.3, plt 162 CMP na 132, k 3.5, glu 118, cr 0.74 ____________________________________________   EKG  I, Phineas Semen, attending physician, personally viewed and interpreted this EKG  EKG Time: 1956 Rate: 116 Rhythm: sinus tachycardia Axis: right axis deviation Intervals: qtc 449 QRS: narrow ST changes: no st elevation Impression: abnormal ekg   ____________________________________________    RADIOLOGY  CXR No acute abnormality  ____________________________________________   PROCEDURES  Procedures  ____________________________________________   INITIAL IMPRESSION / ASSESSMENT AND PLAN / ED COURSE  Pertinent labs & imaging results that were available during my care of the patient were reviewed by me and considered in my medical decision making (see chart for details).   Patient presented from group home because of concerns for aggressive behavior as well as fever.  Terms of the aggressive behavior patient is very calm here.  Group home would like him to be evaluated by psychiatry.  At this point however patient is calm denies SI or HI.  I do not think he requires IVC.  Terms of the fever blood work without any leukocytosis.  Patient's heart rate and fever responded to medication.  No pneumonia on chest x-ray.  I do wonder if patient has cold or flulike illness.  Do not feel he needs any antibiotics or further work-up for the fever at this  time.  ____________________________________________   FINAL CLINICAL IMPRESSION(S) / ED DIAGNOSES  Final diagnoses:  Viral syndrome     Note: This dictation was prepared with Dragon dictation. Any transcriptional errors that result from this process are unintentional     Phineas Semen, MD 12/29/18 [phone]

## 2018-12-28 NOTE — ED Notes (Signed)
Pt refusing to keep blood pressure cuff and pulse ox monitor on. MD aware.

## 2018-12-28 NOTE — ED Notes (Signed)
Patient transported to X-ray 

## 2018-12-28 NOTE — ED Notes (Signed)
This RN attempted to call legal guardian with no answer. This RN contacted Lucendia Herrlich, SIC from [facility] facility that patient resides at. This RN explained to [place] that pt would be medically cleared soon per Derrill Kay, MD and asked if they would like a psych consult for pt. Lucendia Herrlich replied "mhm" on phone and then hung up the phone on this RN. MD Derrill Kay aware.
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
## 2018-12-28 NOTE — ED Notes (Signed)
Pt continually getting out of bed and walking into hallway with IV fluids running. Pt encouraged that we need pt to stay in bed. Pt still continues to get out of bed. Charge nurse aware. Sitter now at bedside due to safety concerns. This RN will continue to monitor.

## 2018-12-29 DIAGNOSIS — B349 Viral infection, unspecified: Secondary | ICD-10-CM | POA: Diagnosis not present

## 2018-12-29 LAB — INFLUENZA PANEL BY PCR (TYPE A & B)
Influenza A By PCR: NEGATIVE
Influenza B By PCR: NEGATIVE

## 2018-12-29 NOTE — ED Provider Notes (Signed)
-----------------------------------------   2:16 AM on 12/29/2018 -----------------------------------------  Assumed care of patient who was transferred from the other side of the ED into the psychiatric side.  Influenza swab added for low-grade fever noted.  Patient was aggressive and cursing the staff.  He was able to be verbally redirected.  Remains voluntary pending tele-psychiatry evaluation.   ----------------------------------------- 4:48 AM on 12/29/2018 -----------------------------------------  Patient was evaluated by [facility] psychiatrist Dr. Jacky Kindle who deems him psychiatrically stable for discharge back to group home.  No new medication recommendations.   Irean Hong, MD 12/29/18 [phone]

## 2018-12-29 NOTE — ED Notes (Signed)
This RN spoke with Oscar Duke, SIC at [facility]. Report given to her and she reports the care home has an account with [employer]. [facility] called to transport pt back to the group home.

## 2018-12-29 NOTE — Discharge Instructions (Addendum)
Please seek medical attention for any high fevers, chest pain, shortness of breath, change in behavior, persistent vomiting, bloody stool or any other new or concerning symptoms.  

## 2018-12-29 NOTE — ED Notes (Signed)
Dr Britt Bottom called in for report on pt and will do [facility]

## 2019-01-02 LAB — CULTURE, BLOOD (ROUTINE X 2)
CULTURE: NO GROWTH
Culture: NO GROWTH

## 2019-01-09 ENCOUNTER — Emergency Department: Payer: Medicare Other

## 2019-01-09 ENCOUNTER — Emergency Department
Admission: EM | Admit: 2019-01-09 | Discharge: 2019-01-10 | Disposition: A | Discharge status: Home / Self Care | Payer: Medicare Other | Attending: Emergency Medicine | Admitting: Emergency Medicine

## 2019-01-09 ENCOUNTER — Other Ambulatory Visit: Payer: Self-pay

## 2019-01-09 DIAGNOSIS — Z9660 Presence of unspecified orthopedic joint implant: Secondary | ICD-10-CM | POA: Insufficient documentation

## 2019-01-09 DIAGNOSIS — R4182 Altered mental status, unspecified: Secondary | ICD-10-CM | POA: Insufficient documentation

## 2019-01-09 DIAGNOSIS — I10 Essential (primary) hypertension: Secondary | ICD-10-CM | POA: Insufficient documentation

## 2019-01-09 DIAGNOSIS — F251 Schizoaffective disorder, depressive type: Secondary | ICD-10-CM | POA: Insufficient documentation

## 2019-01-09 DIAGNOSIS — Z794 Long term (current) use of insulin: Secondary | ICD-10-CM | POA: Diagnosis not present

## 2019-01-09 DIAGNOSIS — Z79899 Other long term (current) drug therapy: Secondary | ICD-10-CM | POA: Diagnosis not present

## 2019-01-09 DIAGNOSIS — F209 Schizophrenia, unspecified: Secondary | ICD-10-CM | POA: Diagnosis not present

## 2019-01-09 DIAGNOSIS — E119 Type 2 diabetes mellitus without complications: Secondary | ICD-10-CM | POA: Diagnosis not present

## 2019-01-09 LAB — URINALYSIS, COMPLETE (UACMP) WITH MICROSCOPIC
BILIRUBIN URINE: NEGATIVE
Bacteria, UA: NONE SEEN
Glucose, UA: NEGATIVE mg/dL
Hgb urine dipstick: NEGATIVE
Ketones, ur: NEGATIVE mg/dL
LEUKOCYTE UA: NEGATIVE
Nitrite: NEGATIVE
PH: 6 (ref 5.0–8.0)
Protein, ur: NEGATIVE mg/dL
SQUAMOUS EPITHELIAL / LPF: NONE SEEN (ref 0–5)
Specific Gravity, Urine: 1.008 (ref 1.005–1.030)

## 2019-01-09 LAB — CBC
HEMATOCRIT: 30.1 % — AB (ref 39.0–52.0)
Hemoglobin: 10.3 g/dL — ABNORMAL LOW (ref 13.0–17.0)
MCH: 30.6 pg (ref 26.0–34.0)
MCHC: 34.2 g/dL (ref 30.0–36.0)
MCV: 89.3 fL (ref 80.0–100.0)
Platelets: 170 10*3/uL (ref 150–400)
RBC: 3.37 MIL/uL — ABNORMAL LOW (ref 4.22–5.81)
RDW: 14.2 % (ref 11.5–15.5)
WBC: 6.3 10*3/uL (ref 4.0–10.5)
nRBC: 0 % (ref 0.0–0.2)

## 2019-01-09 LAB — URINE DRUG SCREEN, QUALITATIVE (ARMC ONLY)
Amphetamines, Ur Screen: NOT DETECTED
Barbiturates, Ur Screen: NOT DETECTED
Benzodiazepine, Ur Scrn: NOT DETECTED
Cannabinoid 50 Ng, Ur ~~LOC~~: NOT DETECTED
Cocaine Metabolite,Ur ~~LOC~~: NOT DETECTED
MDMA (Ecstasy)Ur Screen: NOT DETECTED
Methadone Scn, Ur: NOT DETECTED
Opiate, Ur Screen: NOT DETECTED
Phencyclidine (PCP) Ur S: NOT DETECTED
Tricyclic, Ur Screen: POSITIVE — AB

## 2019-01-09 LAB — COMPREHENSIVE METABOLIC PANEL
ALT: 8 U/L (ref 0–44)
AST: 19 U/L (ref 15–41)
Albumin: 3.4 g/dL — ABNORMAL LOW (ref 3.5–5.0)
Alkaline Phosphatase: 41 U/L (ref 38–126)
Anion gap: 8 (ref 5–15)
BUN: 11 mg/dL (ref 6–20)
CHLORIDE: 97 mmol/L — AB (ref 98–111)
CO2: 24 mmol/L (ref 22–32)
Calcium: 8.6 mg/dL — ABNORMAL LOW (ref 8.9–10.3)
Creatinine, Ser: 0.69 mg/dL (ref 0.61–1.24)
GFR calc Af Amer: >60 mL/min (ref >60)
GFR calc non Af Amer: >60 mL/min (ref >60)
Glucose, Bld: 94 mg/dL (ref 70–99)
Potassium: 3.8 mmol/L (ref 3.5–5.1)
Sodium: 129 mmol/L — ABNORMAL LOW (ref 135–145)
Total Bilirubin: 0.6 mg/dL (ref 0.3–1.2)
Total Protein: 5.8 g/dL — ABNORMAL LOW (ref 6.5–8.1)

## 2019-01-09 LAB — SALICYLATE LEVEL: Salicylate Lvl: <7 mg/dL (ref 2.8–30.0)

## 2019-01-09 LAB — ETHANOL: Alcohol, Ethyl (B): <10 mg/dL (ref <10)

## 2019-01-09 LAB — ACETAMINOPHEN LEVEL: Acetaminophen (Tylenol), Serum: <10 ug/mL — ABNORMAL LOW (ref 10–30)

## 2019-01-09 LAB — LITHIUM LEVEL: Lithium Lvl: <0.06 mmol/L — ABNORMAL LOW (ref 0.60–1.20)

## 2019-01-09 LAB — GLUCOSE, CAPILLARY: Glucose-Capillary: 89 mg/dL (ref 70–99)

## 2019-01-09 MED ORDER — LORAZEPAM 2 MG/ML IJ SOLN
INTRAMUSCULAR | Status: AC
  Administered 2019-01-09: 2 mg via INTRAVENOUS
  Filled 2019-01-09: qty 1

## 2019-01-09 MED ORDER — ZIPRASIDONE MESYLATE 20 MG IM SOLR
10.0000 mg | Freq: Once | INTRAMUSCULAR | Status: AC
  Administered 2019-01-09: 10 mg via INTRAMUSCULAR

## 2019-01-09 MED ORDER — ZIPRASIDONE MESYLATE 20 MG IM SOLR: 10.0000 mg | Freq: Once | INTRAMUSCULAR | Status: DC

## 2019-01-09 MED ORDER — SODIUM CHLORIDE 0.9 % IV BOLUS
1000.0000 mL | Freq: Once | INTRAVENOUS | Status: AC
  Administered 2019-01-09: 1000 mL via INTRAVENOUS

## 2019-01-09 MED ORDER — LORAZEPAM 2 MG/ML IJ SOLN
2.0000 mg | Freq: Once | INTRAMUSCULAR | Status: AC
  Administered 2019-01-09: 2 mg via INTRAVENOUS

## 2019-01-09 MED ORDER — ZIPRASIDONE MESYLATE 20 MG IM SOLR
20.0000 mg | Freq: Once | INTRAMUSCULAR | Status: DC
  Filled 2019-01-09: qty 20

## 2019-01-09 NOTE — ED Notes (Signed)
IVC/ Plan to Admit Medically

## 2019-01-09 NOTE — ED Notes (Signed)
Pt continuing to attempt to get out of bed stating he has to urinate. Urinal given and pt refusing. Pt states "I will fight ya'll if ya'll don't let me get up".

## 2019-01-09 NOTE — ED Notes (Signed)
Sitter at bedside.

## 2019-01-09 NOTE — ED Notes (Signed)
Pt appears to be asleep at this time.

## 2019-01-09 NOTE — ED Notes (Addendum)
Pt getting agitated and being belligerent. Attempting to get out of bed multiple times. Pt screaming stating "I will hurt ya'll if ya'll dont let me get up". Danelle Earthly RN notified

## 2019-01-09 NOTE — ED Notes (Signed)
Pt states he has to use the restroom and attempts to get out of bed. ODS and this Clinical research associate at bedside. Pt very unsteady on his feet so urinal was given instead. Urinal was placed for pt to use the restroom while sitting in the bed and pt moved the urinal away and urinated on the bed instead. Asked pt the reason for moving urinal and stated he never did that. Danelle Earthly, RN, ODS officer and this writer cleaned pts bed and pt. Pt continues to state that he has to urinate, so while cleaning pts bed, we stood pt and gave him a urinal. Pt did not urinate at this time. Pt placed in bed and continues to state he has to urinate. Urinal given to pt with no success.

## 2019-01-09 NOTE — ED Notes (Signed)
Pt becoming agitated. Pt states "I'm gonna whoop all of ya'll ass' if ya'll don't let me get up. I don't wanna lay down I wanna get up". Told pt he was not able to get up d/t him being a high risk fall. Notified Danelle Earthly, Charity fundraiser.

## 2019-01-09 NOTE — ED Notes (Signed)
Pt ate sandwich tray and 2 drinks.

## 2019-01-09 NOTE — ED Notes (Signed)
Pt continues to get out of bed despite being verbally reoriented. High fall risk socks, band, sign and bed alarm on patient.

## 2019-01-09 NOTE — ED Notes (Signed)
Upon Rn entering exam room, pt is awake, alert to self, and place, disoriented to time. Pt eating carrots and celery. Pt has Oscar Duke, ED TECH at bedside for safety pt is wandering off room per Dr. Marisa Severin

## 2019-01-09 NOTE — ED Notes (Signed)
Call from sitter: Pt agitated unable to orient to not walking around, pt toileted (urinal)   EDP notified, Dr Marisa Severin given orders
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
## 2019-01-09 NOTE — ED Notes (Signed)
Pt continues to get out of the bed despite multiple attempts at reorientation. Fall mats placed at stretcher end. Charge RN informed.

## 2019-01-09 NOTE — ED Notes (Signed)
Contact with TTS, reports the psychiatrist (rounding) will be available to see pt in am

## 2019-01-09 NOTE — ED Notes (Signed)
Sitter at bedside. ODS aware that pt is IVC.

## 2019-01-09 NOTE — ED Notes (Signed)
Pt sleeping, sitter at bedside, security at bedside

## 2019-01-09 NOTE — ED Notes (Signed)
Pt using urinal at this time while laying in bed. Pt stating he wants to get up. This tech made pt aware that he was a fall risk and has had meds that could make him fall if he were to get up.

## 2019-01-09 NOTE — ED Notes (Signed)
Pt standing up in room, trying to put on clothes. States that he is leaving and running down the hallway. EDP informed, orders placed. Sitter continues to be at bedside.

## 2019-01-09 NOTE — ED Notes (Signed)
Provided pt with some warm blankets no other needs at present. Will continue to monitor.
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
## 2019-01-09 NOTE — ED Notes (Addendum)
Contact with legal gaurdian, Micheal Herring, reports pt has called 911 in the past to c/o self harm only to call Mr Leitha Bleak later and report "yeah I'm good, I just wanted something to eat and watch TV", also reports that Mr Leitha Bleak believes pt is "for real" this time and is in the right place d/t to too many meds  Pt should be able to return to group home when [facility], call:  Jimmye Norman at [phone]  [facility] [address] [place], [place] [address]

## 2019-01-09 NOTE — ED Notes (Signed)
Call from [facility], legal guardian, from [place], [place],   Reports hx of pt intimidating others to pt's advantage, pt is intelligent and can be manipulative,   Reports suspicion over the last 3-4 days of pt being over medicated d/t pt reporting "I can't sleep at night" but pt does NOT report that pt's sleeping all day  Guardian reports repeated requested to be contacted prior pt's medication changes but requests unhonored

## 2019-01-10 DIAGNOSIS — R4182 Altered mental status, unspecified: Secondary | ICD-10-CM | POA: Diagnosis not present

## 2019-01-10 LAB — VALPROIC ACID LEVEL: Valproic Acid Lvl: 46 ug/mL — ABNORMAL LOW (ref 50.0–100.0)

## 2019-01-10 MED ORDER — TRAZODONE HCL 50 MG PO TABS: 50.0000 mg | ORAL_TABLET | Freq: Every day | ORAL | 0 refills | Status: AC

## 2019-01-10 MED ORDER — TRAZODONE HCL 50 MG PO TABS
150.0000 mg | ORAL_TABLET | Freq: Every day | ORAL | Status: DC
  Administered 2019-01-10: 150 mg via ORAL
  Filled 2019-01-10: qty 1

## 2019-01-10 MED ORDER — MIRTAZAPINE 15 MG PO TBDP: 15.0000 mg | ORAL_TABLET | Freq: Every day | ORAL | 2 refills | Status: DC

## 2019-01-10 MED ORDER — TRAZODONE HCL 50 MG PO TABS: 50.0000 mg | ORAL_TABLET | Freq: Every day | ORAL | Status: DC

## 2019-01-10 MED ORDER — QUETIAPINE FUMARATE 25 MG PO TABS
100.0000 mg | ORAL_TABLET | Freq: Every day | ORAL | Status: DC
  Administered 2019-01-10: 100 mg via ORAL
  Filled 2019-01-10: qty 4

## 2019-01-10 NOTE — ED Provider Notes (Signed)
-----------------------------------------   1:31 AM on 01/10/2019 -----------------------------------------   Assumed care from Dr. Marisa Severin.  In short, Oscar Duke is a 48 y.o. male with a chief complaint of AMS.  Refer to the original H&P for additional details.  The patient is under involuntary commitment and is awaiting psychiatric evaluation and disposition.  The patient is not able to sleep in spite of his prior sedation and has been aggressive towards ED staff although he has not yet required physical restraint but frequent redirection.  I reviewed his prior medications and see that he typically takes Seroquel and trazodone at night.  Rather than giving him additional IV or intramuscular sedation, I am giving him a dose of trazodone 150 mg p.o. and Seroquel 100 mg p.o. in the hopes that this will help him and keep our ED staff safe but not significantly alter his sensorium so that he will be appropriate for psychiatric evaluation.   Loleta Rose, MD 01/10/19 [phone]

## 2019-01-10 NOTE — ED Notes (Signed)
Pt given water at this time 

## 2019-01-10 NOTE — Discharge Instructions (Signed)
Patient was seen by psychiatry who has adjusted his medications

## 2019-01-10 NOTE — ED Notes (Signed)
This RN in CT with critical pt when called regarding pt's behavior, Dr York Cerise notified, pt assessed to be alert and calm att but admits to "acting up", pt oriented to expected behavior, reports that he will take meds, pt requesting "chocolate nuts and tea", pt given water to discourage from caffeine

## 2019-01-10 NOTE — ED Notes (Signed)
Pt dressed for transport back to [facility] and provided with food and drink at this time - awaiting taxi
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
## 2019-01-10 NOTE — ED Notes (Signed)
[employer] taxi called to transport pt back to [facility] - they state they will be here in one hour to one and half hour to pick pt up - they will come to first nurse station when they arrive

## 2019-01-10 NOTE — ED Provider Notes (Signed)
Seen by Dr. Lavena Bullion of psychiatry who has cleared the patient for discharge after adjusting his medications   Jene Every, MD 01/10/19 [phone]
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
## 2019-01-10 NOTE — ED Notes (Signed)
Patient trying to get out of bed. Continuously  Asking to use the bathroom and for something to eat.AS

## 2019-01-10 NOTE — ED Notes (Signed)
Pt's guardian given discharge instructions, pt unable to sign for his discharge

## 2019-01-10 NOTE — ED Notes (Signed)
Patient urinated all over bed trying to use the urinal. Changed wet bed and sheets on patient. Put clean yellow socks on.AS

## 2019-01-10 NOTE — ED Notes (Addendum)
Spoke with Nelle Don (legal guardian) and he was advised that pt was being discharged and returned to group home - when questioned as to how the patient would get to group home he states that they have a contract with a cab company He stated pt came from [facility] Notified group home of pt return and they stated to call Cheyenne Adas for transport to facility

## 2019-01-10 NOTE — ED Notes (Signed)
Pt urinated on bed. When asked pt the reason he stated "don't worry about it".
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
## 2019-01-10 NOTE — ED Notes (Signed)
Psychiatrist requested that pt have valporic acid level drawn and if normal to be discharged Dr Cyril Loosen states that pt can be discharged

## 2019-01-10 NOTE — ED Notes (Signed)
Pt calm at this time. Tech has to continuously insure pt to use the condom catheter and that it doesn't require pt to get up.AS
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
## 2019-02-23 ENCOUNTER — Encounter: Payer: Self-pay | Admitting: Emergency Medicine

## 2019-02-23 ENCOUNTER — Other Ambulatory Visit: Payer: Self-pay

## 2019-02-23 ENCOUNTER — Emergency Department
Admission: EM | Admit: 2019-02-23 | Discharge: 2019-02-23 | Disposition: A | Discharge status: Home / Self Care | Payer: Medicare Other | Attending: Emergency Medicine | Admitting: Emergency Medicine

## 2019-02-23 DIAGNOSIS — I10 Essential (primary) hypertension: Secondary | ICD-10-CM | POA: Insufficient documentation

## 2019-02-23 DIAGNOSIS — E119 Type 2 diabetes mellitus without complications: Secondary | ICD-10-CM | POA: Insufficient documentation

## 2019-02-23 DIAGNOSIS — Z9660 Presence of unspecified orthopedic joint implant: Secondary | ICD-10-CM | POA: Diagnosis not present

## 2019-02-23 DIAGNOSIS — G40909 Epilepsy, unspecified, not intractable, without status epilepticus: Secondary | ICD-10-CM

## 2019-02-23 DIAGNOSIS — F250 Schizoaffective disorder, bipolar type: Secondary | ICD-10-CM | POA: Diagnosis not present

## 2019-02-23 DIAGNOSIS — Z79899 Other long term (current) drug therapy: Secondary | ICD-10-CM | POA: Insufficient documentation

## 2019-02-23 DIAGNOSIS — R4689 Other symptoms and signs involving appearance and behavior: Secondary | ICD-10-CM | POA: Diagnosis present

## 2019-02-23 DIAGNOSIS — F79 Unspecified intellectual disabilities: Secondary | ICD-10-CM

## 2019-02-23 LAB — ACETAMINOPHEN LEVEL: Acetaminophen (Tylenol), Serum: <10 ug/mL — ABNORMAL LOW (ref 10–30)

## 2019-02-23 LAB — COMPREHENSIVE METABOLIC PANEL
ALT: 12 U/L (ref 0–44)
AST: 17 U/L (ref 15–41)
Albumin: 4.2 g/dL (ref 3.5–5.0)
Alkaline Phosphatase: 52 U/L (ref 38–126)
Anion gap: 7 (ref 5–15)
BUN: 7 mg/dL (ref 6–20)
CO2: 25 mmol/L (ref 22–32)
Calcium: 8.9 mg/dL (ref 8.9–10.3)
Chloride: 101 mmol/L (ref 98–111)
Creatinine, Ser: 0.69 mg/dL (ref 0.61–1.24)
GFR calc Af Amer: >60 mL/min (ref >60)
GFR calc non Af Amer: >60 mL/min (ref >60)
Glucose, Bld: 111 mg/dL — ABNORMAL HIGH (ref 70–99)
Potassium: 4.1 mmol/L (ref 3.5–5.1)
Sodium: 133 mmol/L — ABNORMAL LOW (ref 135–145)
Total Bilirubin: 0.6 mg/dL (ref 0.3–1.2)
Total Protein: 7.7 g/dL (ref 6.5–8.1)

## 2019-02-23 LAB — URINE DRUG SCREEN, QUALITATIVE (ARMC ONLY)
Amphetamines, Ur Screen: NOT DETECTED
Barbiturates, Ur Screen: NOT DETECTED
Benzodiazepine, Ur Scrn: NOT DETECTED
Cannabinoid 50 Ng, Ur ~~LOC~~: NOT DETECTED
Cocaine Metabolite,Ur ~~LOC~~: NOT DETECTED
MDMA (Ecstasy)Ur Screen: NOT DETECTED
Methadone Scn, Ur: NOT DETECTED
Opiate, Ur Screen: NOT DETECTED
Phencyclidine (PCP) Ur S: NOT DETECTED
Tricyclic, Ur Screen: NOT DETECTED

## 2019-02-23 LAB — ETHANOL: Alcohol, Ethyl (B): <10 mg/dL (ref <10)

## 2019-02-23 LAB — CBC
HCT: 34.6 % — ABNORMAL LOW (ref 39.0–52.0)
Hemoglobin: 11.8 g/dL — ABNORMAL LOW (ref 13.0–17.0)
MCH: 30.6 pg (ref 26.0–34.0)
MCHC: 34.1 g/dL (ref 30.0–36.0)
MCV: 89.6 fL (ref 80.0–100.0)
Platelets: 191 10*3/uL (ref 150–400)
RBC: 3.86 MIL/uL — ABNORMAL LOW (ref 4.22–5.81)
RDW: 13.9 % (ref 11.5–15.5)
WBC: 7.1 10*3/uL (ref 4.0–10.5)
nRBC: 0 % (ref 0.0–0.2)

## 2019-02-23 LAB — SALICYLATE LEVEL: Salicylate Lvl: <7 mg/dL (ref 2.8–30.0)

## 2019-02-23 MED ORDER — INSULIN ASPART 100 UNIT/ML ~~LOC~~ SOLN: 0.0000 [IU] | Freq: Three times a day (TID) | SUBCUTANEOUS | Status: DC

## 2019-02-23 MED ORDER — CLONAZEPAM 0.5 MG PO TABS
1.0000 mg | ORAL_TABLET | Freq: Four times a day (QID) | ORAL | Status: DC
  Administered 2019-02-23: 1 mg via ORAL
  Filled 2019-02-23: qty 2

## 2019-02-23 MED ORDER — BENZTROPINE MESYLATE 1 MG PO TABS
2.0000 mg | ORAL_TABLET | Freq: Two times a day (BID) | ORAL | Status: DC
  Administered 2019-02-23: 2 mg via ORAL
  Filled 2019-02-23: qty 2

## 2019-02-23 MED ORDER — TRAZODONE HCL 50 MG PO TABS: 50.0000 mg | ORAL_TABLET | Freq: Every day | ORAL | Status: DC

## 2019-02-23 MED ORDER — MIRTAZAPINE 15 MG PO TBDP
15.0000 mg | ORAL_TABLET | Freq: Every day | ORAL | Status: DC
  Filled 2019-02-23: qty 1

## 2019-02-23 MED ORDER — DIVALPROEX SODIUM 500 MG PO DR TAB
500.0000 mg | DELAYED_RELEASE_TABLET | Freq: Two times a day (BID) | ORAL | Status: DC
  Administered 2019-02-23: 500 mg via ORAL
  Filled 2019-02-23: qty 1

## 2019-02-23 MED ORDER — QUETIAPINE FUMARATE 25 MG PO TABS: 100.0000 mg | ORAL_TABLET | ORAL | Status: DC | PRN

## 2019-02-23 MED ORDER — LACTULOSE 10 GM/15ML PO SOLN
10.0000 g | Freq: Two times a day (BID) | ORAL | Status: DC
  Administered 2019-02-23: 10 g via ORAL
  Filled 2019-02-23: qty 30

## 2019-02-23 MED ORDER — INSULIN DETEMIR 100 UNIT/ML ~~LOC~~ SOLN
28.0000 [IU] | Freq: Every day | SUBCUTANEOUS | Status: DC
  Filled 2019-02-23: qty 0.28

## 2019-02-23 MED ORDER — SODIUM CHLORIDE 1 G PO TABS
1.0000 g | ORAL_TABLET | Freq: Every day | ORAL | Status: DC
  Filled 2019-02-23: qty 1

## 2019-02-23 MED ORDER — IBUPROFEN 600 MG PO TABS
600.0000 mg | ORAL_TABLET | Freq: Four times a day (QID) | ORAL | Status: DC | PRN
  Administered 2019-02-23: 600 mg via ORAL
  Filled 2019-02-23: qty 1

## 2019-02-23 MED ORDER — PERPHENAZINE 4 MG PO TABS
8.0000 mg | ORAL_TABLET | Freq: Three times a day (TID) | ORAL | Status: DC
  Filled 2019-02-23 (×2): qty 2

## 2019-02-23 MED ORDER — FLUPHENAZINE HCL 5 MG PO TABS
10.0000 mg | ORAL_TABLET | Freq: Three times a day (TID) | ORAL | Status: DC
  Filled 2019-02-23 (×2): qty 2

## 2019-02-23 MED ORDER — PANTOPRAZOLE SODIUM 40 MG PO TBEC
40.0000 mg | DELAYED_RELEASE_TABLET | Freq: Every day | ORAL | Status: DC
  Administered 2019-02-23: 40 mg via ORAL
  Filled 2019-02-23: qty 1

## 2019-02-23 MED ORDER — LEVETIRACETAM 500 MG PO TABS
1500.0000 mg | ORAL_TABLET | Freq: Every day | ORAL | Status: DC
  Administered 2019-02-23: 13:00:00 1500 mg via ORAL
  Filled 2019-02-23: qty 3

## 2019-02-23 NOTE — BH Assessment (Signed)
Writer made attempts to contact Group Home [phone]) but was unable to reach anyone. The voicemail was full and was unable to leave a message requesting a return phone call.
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
## 2019-02-23 NOTE — ED Notes (Addendum)
Pt waiting on cab. Maintained on 15 minute checks and observation by security camera for safety.

## 2019-02-23 NOTE — ED Notes (Signed)
Pt belongings   1 camo hat,i pr of white socks, 1 pair of blue jeans with brown belt,shoes,grat tie, 1 blue,1 red ,1 green and 1 white t shirt

## 2019-02-23 NOTE — ED Notes (Signed)
Pt will be discharged back to his group home.

## 2019-02-23 NOTE — Discharge Instructions (Signed)
Schizoaffective Disorder  Schizoaffective disorder (ScAD) is a mental illness. It causes symptoms that are a mixture of a psychotic disorder (schizophrenia) and a mood (affective) disorder. A psychotic disorder involves losing touch with reality.  ScAD usually occurs in cycles. Periods of severe symptoms may be followed by periods of less severe symptoms or improvement. The illness affects men and women equally, but it usually appears at an earlier age (teenage or early adult years) in men. People who have family members with schizophrenia, bipolar disorder, or ScAD are at higher risk of developing ScAD.  ScAD may interfere with personal relationships or normal daily activities. People with ScAD are at a higher risk for:   Job loss.   Social aloneness (isolation).   Health problems.   Anxiety.   Substance use disorders.   Suicide.  What are the causes?  The exact cause of this condition is not known.  What increases the risk?  The following factors may make you more likely to develop this condition:   Problems during your mother's pregnancy and after your birth, such as:  ? Your mother having the flu (influenza) during the second semester of the pregnancy.  ? Exposure to drugs, alcohol, illnesses, or other poisons (toxins) before birth.  ? Low birth weight.   A brain infection or viral infection.   Problems with brain structure or function.   Having family members with bipolar disorder, ScAD, or schizophrenia.   Substance abuse.   Having been diagnosed with a mental health condition in the past.   Being a victim of neglect or long-term (chronic) abuse.  What are the signs or symptoms?  At any one time, people with ScAD may have psychotic symptoms only or have both psychotic and affective symptoms.  Psychotic symptoms may include:   Hearing, seeing, or feeling things that are not there (hallucinations).   Having fixed, false beliefs (delusions). The delusions usually include being attacked, harassed,  or plotted against (paranoid delusions).   Speaking in a way that makes no sense to others (disorganized speech).   Confusing or odd behavior.   Loss of motivation for normal daily activities, such as self-care.   Withdrawal from social contacts (social isolation).   Lack of emotions.  Affective symptoms may include:   Symptoms similar to major depression, such as:  ? Depressed mood.  ? Loss of interest in activities that are usually pleasurable (anhedonia).  ? Sleeping more or less than normal.  ? Feeling worthless or excessively guilty.  ? Lack of energy or motivation.  ? Trouble concentrating.  ? Eating more or less than usual.  ? Thinking a lot about death or suicide.   Symptoms similar to bipolar mania. Bipolar mania refers to periods of severe elation, irritability, and high energy that are experienced by people who have bipolar disorder. These symptoms may include:  ? Abnormally elevated or irritable mood.  ? Abnormally increased energy or activity.  ? More confidence than normal or feeling that you are able to do anything (grandiosity).  ? Feeling rested after getting less sleep than normal.  ? Being easily distracted.  ? Talking more than usual or feeling pressure to keep talking.  ? Feeling that your thoughts are racing.  ? Engaging in high-risk activities.  How is this diagnosed?  ScAD is diagnosed through an assessment by your health care provider. Your health care provider may refer you to a mental health specialist for evaluation. The mental health specialist:   Will observe and ask   questions about your thoughts, behavior, mood, and ability to function in daily life.   May ask questions about your medical history and use of drugs, including prescription medicines. Certain medical conditions and substances can cause symptoms that resemble ScAD.   May do blood tests and imaging tests.  There are two types of ScAD:   Depressive ScAD. This type is diagnosed when you have only depressive  symptoms.   Bipolar ScAD. This type is diagnosed if your affective symptoms are only manic or are a mixture of manic and depressive.  How is this treated?  ScAD is usually a lifelong (chronic) illness that requires long-term treatment. Treatment may include:   Medicine. Different types of medicine are used to treat ScAD. The exact combination depends on the type and severity of your symptoms.  ? Antipsychotic medicine may be used to control psychotic symptoms such as delusions, paranoia, and hallucinations.  ? Mood stabilizers may be used to balance the highs and lows of bipolar manic mood swings.  ? Antidepressant medicines may be used to treat depressive symptoms.   Counseling or talk therapy. Individual, group, or family counseling may be helpful in providing education, support, and guidance. Many people with ScAD also benefit from social skills and job skills (vocational) training.  A combination of medicine and counseling is usually best for managing the disorder over time. A procedure in which electricity is applied to the brain through the scalp (electroconvulsive therapy) may be used to treat people with severe manic symptoms who do not respond to medicine and counseling.  Follow these instructions at home:     Take over-the-counter and prescription medicines only as told by your health care provider. Check with your health care provider before starting new medicines.   Surround yourself with people who care about you and can help you manage your condition.   Keep stress under control. Stress may make symptoms worse.   Avoid alcohol and drugs. They can affect how medicine works and make symptoms worse.   Keep all follow-up visits as told by your health care provider and counselor. This is important.  Contact a health care provider if:   You are not able to take your medicines as prescribed.   Your symptoms get worse.  Get help right away if:   You feel out of control.   You or others notice  warning signs of suicide, such as:  ? Increased use of drugs or alcohol  ? Expressing feelings of not having a purpose in life or feeling trapped, guilty, anxious, agitated, or hopeless.  ? Withdrawing from friends and family.  ? Showing uncontrolled anger, recklessness, and dramatic mood changes.  ? Talking about suicide or searching for methods.  If you ever feel like you may hurt yourself or others, or have thoughts about taking your own life, get help right away. You can go to your nearest emergency department or call:   Your local emergency services (911 in the [place]   A suicide crisis helpline, such as the National Suicide Prevention Lifeline at 1-800-273-8255. This is open 24 hours a day.  Summary   Schizoaffective disorder causes symptoms that are a mixture of a psychotic disorder and a mood disorder.   A combination of medicine and counseling is usually best for managing the disorder over time.   People who have schizoaffective disorder are at risk for suicide. Get help right away if you or someone else notices warning signs of suicide.  This information is not   intended to replace advice given to you by your health care provider. Make sure you discuss any questions you have with your health care provider.  Document Released: 03/11/2007 Document Revised: 08/10/2016 Document Reviewed: 08/10/2016  Elsevier Interactive Patient Education  2019 [employer]

## 2019-02-23 NOTE — ED Notes (Addendum)
RN spoke with [place] (patient's HPOA).  Pt to be discharged back to group home.  [employer] cab called.  Group home staff told this Clinical research associate to have cab company charged to [employer].

## 2019-02-23 NOTE — ED Notes (Signed)
Pt discharged back to group home. Pt's HPOA made aware. Pt denies SI/HI. VS stable. Discharge instructions reviewed with patient. Pt signed for discharge. Group home told this Clinical research associate to have cab charged to [employer].
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
## 2019-02-24 ENCOUNTER — Encounter: Payer: Self-pay | Admitting: *Deleted

## 2019-02-24 ENCOUNTER — Emergency Department
Admission: EM | Admit: 2019-02-24 | Discharge: 2019-02-27 | Disposition: A | Discharge status: Home / Self Care | Payer: Medicare Other | Attending: Emergency Medicine | Admitting: Emergency Medicine

## 2019-02-24 ENCOUNTER — Other Ambulatory Visit: Payer: Self-pay

## 2019-02-24 DIAGNOSIS — I10 Essential (primary) hypertension: Secondary | ICD-10-CM | POA: Insufficient documentation

## 2019-02-24 DIAGNOSIS — E119 Type 2 diabetes mellitus without complications: Secondary | ICD-10-CM | POA: Diagnosis not present

## 2019-02-24 DIAGNOSIS — F250 Schizoaffective disorder, bipolar type: Secondary | ICD-10-CM | POA: Diagnosis not present

## 2019-02-24 DIAGNOSIS — Z794 Long term (current) use of insulin: Secondary | ICD-10-CM | POA: Insufficient documentation

## 2019-02-24 DIAGNOSIS — R4689 Other symptoms and signs involving appearance and behavior: Secondary | ICD-10-CM | POA: Diagnosis present

## 2019-02-24 DIAGNOSIS — Z79899 Other long term (current) drug therapy: Secondary | ICD-10-CM | POA: Diagnosis not present

## 2019-02-24 LAB — URINE DRUG SCREEN, QUALITATIVE (ARMC ONLY)
Amphetamines, Ur Screen: NOT DETECTED
Barbiturates, Ur Screen: NOT DETECTED
Benzodiazepine, Ur Scrn: NOT DETECTED
Cannabinoid 50 Ng, Ur ~~LOC~~: NOT DETECTED
Cocaine Metabolite,Ur ~~LOC~~: NOT DETECTED
MDMA (Ecstasy)Ur Screen: NOT DETECTED
Methadone Scn, Ur: NOT DETECTED
Opiate, Ur Screen: NOT DETECTED
Phencyclidine (PCP) Ur S: NOT DETECTED
Tricyclic, Ur Screen: POSITIVE — AB

## 2019-02-24 LAB — CBC
HCT: 32.9 % — ABNORMAL LOW (ref 39.0–52.0)
Hemoglobin: 11.4 g/dL — ABNORMAL LOW (ref 13.0–17.0)
MCH: 30.8 pg (ref 26.0–34.0)
MCHC: 34.7 g/dL (ref 30.0–36.0)
MCV: 88.9 fL (ref 80.0–100.0)
Platelets: 187 10*3/uL (ref 150–400)
RBC: 3.7 MIL/uL — ABNORMAL LOW (ref 4.22–5.81)
RDW: 13.8 % (ref 11.5–15.5)
WBC: 8.8 10*3/uL (ref 4.0–10.5)
nRBC: 0 % (ref 0.0–0.2)

## 2019-02-24 LAB — COMPREHENSIVE METABOLIC PANEL
ALT: 12 U/L (ref 0–44)
AST: 14 U/L — ABNORMAL LOW (ref 15–41)
Albumin: 4.2 g/dL (ref 3.5–5.0)
Alkaline Phosphatase: 55 U/L (ref 38–126)
Anion gap: 11 (ref 5–15)
BUN: 9 mg/dL (ref 6–20)
CO2: 24 mmol/L (ref 22–32)
Calcium: 8.9 mg/dL (ref 8.9–10.3)
Chloride: 98 mmol/L (ref 98–111)
Creatinine, Ser: 0.85 mg/dL (ref 0.61–1.24)
GFR calc Af Amer: >60 mL/min (ref >60)
GFR calc non Af Amer: >60 mL/min (ref >60)
Glucose, Bld: 111 mg/dL — ABNORMAL HIGH (ref 70–99)
Potassium: 3.6 mmol/L (ref 3.5–5.1)
Sodium: 133 mmol/L — ABNORMAL LOW (ref 135–145)
Total Bilirubin: 0.4 mg/dL (ref 0.3–1.2)
Total Protein: 7.3 g/dL (ref 6.5–8.1)

## 2019-02-24 LAB — ETHANOL: Alcohol, Ethyl (B): <10 mg/dL (ref <10)

## 2019-02-24 MED ORDER — LORAZEPAM 2 MG PO TABS
2.0000 mg | ORAL_TABLET | Freq: Once | ORAL | Status: AC
  Administered 2019-02-24: 21:00:00 2 mg via ORAL
  Filled 2019-02-24: qty 1

## 2019-02-24 NOTE — ED Notes (Signed)
Pt ACT team member called this RN to give heads up about pt arrival to ER. Left number [phone] [facility] Sender.
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
## 2019-02-24 NOTE — ED Triage Notes (Signed)
Pt brought in by [employer].  Pt lives is a group  home.  Pt reports needs help.  Denies SI or HI.  Pt denies drug or etoh use.  Pt alert.
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
## 2019-02-24 NOTE — ED Notes (Signed)
Patient asked to stay in room. Patient gets agitated at this writer.

## 2019-02-24 NOTE — ED Notes (Signed)
Group home [phone] Oscar Duke  [phone]

## 2019-02-24 NOTE — ED Notes (Signed)
Patient took PO medication with no issues

## 2019-02-24 NOTE — ED Notes (Signed)
Pt refusing bloodwork at this time

## 2019-02-25 DIAGNOSIS — F250 Schizoaffective disorder, bipolar type: Secondary | ICD-10-CM

## 2019-02-25 LAB — AMMONIA: Ammonia: 39 umol/L — ABNORMAL HIGH (ref 9–35)

## 2019-02-25 MED ORDER — FLUPHENAZINE HCL 5 MG PO TABS
10.0000 mg | ORAL_TABLET | Freq: Three times a day (TID) | ORAL | Status: DC
  Administered 2019-02-25 – 2019-02-27 (×6): 10 mg via ORAL
  Filled 2019-02-25 (×10): qty 2

## 2019-02-25 MED ORDER — BENZTROPINE MESYLATE 1 MG PO TABS
2.0000 mg | ORAL_TABLET | Freq: Two times a day (BID) | ORAL | Status: DC
  Administered 2019-02-25 – 2019-02-27 (×5): 2 mg via ORAL
  Filled 2019-02-25 (×5): qty 2

## 2019-02-25 MED ORDER — DIVALPROEX SODIUM 500 MG PO DR TAB
500.0000 mg | DELAYED_RELEASE_TABLET | Freq: Two times a day (BID) | ORAL | Status: DC
  Administered 2019-02-25 – 2019-02-26 (×3): 500 mg via ORAL
  Filled 2019-02-25 (×3): qty 1

## 2019-02-25 MED ORDER — TRAZODONE HCL 50 MG PO TABS
50.0000 mg | ORAL_TABLET | Freq: Every day | ORAL | Status: DC
  Administered 2019-02-25 – 2019-02-26 (×3): 50 mg via ORAL
  Filled 2019-02-25 (×3): qty 1

## 2019-02-25 MED ORDER — LORAZEPAM 2 MG PO TABS
2.0000 mg | ORAL_TABLET | Freq: Four times a day (QID) | ORAL | Status: DC | PRN
  Administered 2019-02-27 (×2): 2 mg via ORAL
  Filled 2019-02-25 (×3): qty 1

## 2019-02-25 MED ORDER — LORAZEPAM 1 MG PO TABS
1.0000 mg | ORAL_TABLET | ORAL | Status: AC | PRN
  Administered 2019-02-25: 1 mg via ORAL
  Filled 2019-02-25: qty 1

## 2019-02-25 MED ORDER — LORAZEPAM 2 MG PO TABS
2.0000 mg | ORAL_TABLET | Freq: Once | ORAL | Status: AC
  Administered 2019-02-25: 2 mg via ORAL
  Filled 2019-02-25: qty 1

## 2019-02-25 MED ORDER — QUETIAPINE FUMARATE 200 MG PO TABS
200.0000 mg | ORAL_TABLET | Freq: Two times a day (BID) | ORAL | Status: DC
  Administered 2019-02-25 – 2019-02-26 (×3): 200 mg via ORAL
  Filled 2019-02-25 (×3): qty 1

## 2019-02-25 MED ORDER — ZIPRASIDONE MESYLATE 20 MG IM SOLR: 20.0000 mg | Freq: Two times a day (BID) | INTRAMUSCULAR | Status: DC | PRN

## 2019-02-25 MED ORDER — MIRTAZAPINE 15 MG PO TABS
15.0000 mg | ORAL_TABLET | Freq: Every day | ORAL | Status: DC
  Administered 2019-02-25 – 2019-02-26 (×2): 15 mg via ORAL
  Filled 2019-02-25 (×2): qty 1

## 2019-02-25 MED ORDER — LACTULOSE 10 GM/15ML PO SOLN
10.0000 g | Freq: Two times a day (BID) | ORAL | Status: DC
  Administered 2019-02-25 – 2019-02-26 (×4): 10 g via ORAL
  Filled 2019-02-25 (×8): qty 30

## 2019-02-25 MED ORDER — CLONAZEPAM 0.5 MG PO TABS
2.0000 mg | ORAL_TABLET | Freq: Two times a day (BID) | ORAL | Status: DC
  Administered 2019-02-25 – 2019-02-27 (×5): 2 mg via ORAL
  Filled 2019-02-25 (×5): qty 4
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
## 2019-02-25 NOTE — ED Notes (Signed)
Patient woke up around 0130. Patient had to be redirected to room multiple times. Patient asking for a mask. This writer advised patient to stay in room and keep door closed and he would not need a mask and also not able to give a mask in this area. Officer spoke with patient and patient currently in room with door closed as advised.

## 2019-02-25 NOTE — ED Notes (Signed)
Patient currently in room talking to self.

## 2019-02-25 NOTE — ED Provider Notes (Signed)
-----------------------------------------   6:16 AM on 02/25/2019 -----------------------------------------   Blood pressure (!) 143/103, pulse (!) 116, temperature 98.3 F (36.8 C), temperature source Oral, resp. rate 18, height 6\' 4"  (1.93 m), weight 99.8 kg, SpO2 95 %.  The patient is sleeping at this time.  There have been no acute events since the last update.  Patient was evaluated by psychiatric NP who does not feel patient requires hospitalization nor does he meet criteria for IVC.  However, she does recommend holding for inpatient psychiatrist to evaluate this morning for possible medication adjustments.   Irean Hong, MD 02/25/19 [phone]

## 2019-02-25 NOTE — ED Notes (Signed)
Report to include Situation, Background, Assessment, and Recommendations received from Amy RN. Patient alert and oriented, warm and dry, in no acute distress. Patient denies SI, HI, AVH and pain. Patient made aware of Q15 minute rounds and security cameras for their safety. Patient instructed to come to me with needs or concerns.  

## 2019-02-25 NOTE — ED Notes (Signed)
Hourly rounding reveals patient in room. No complaints, stable, in no acute distress. Q15 minute rounds and monitoring via Security Cameras to continue. 
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
## 2019-02-25 NOTE — ED Notes (Signed)
Vol/Pending placement  

## 2019-02-25 NOTE — ED Notes (Signed)
Pt awake and in the doorway of his room.

## 2019-02-25 NOTE — ED Notes (Signed)
Pt at nurse's station asking for pudding, chips and juice. Maintained on 15 minute checks and observation by security camera for safety.
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
## 2019-02-25 NOTE — ED Notes (Signed)
Patient awake in door way of room.

## 2019-02-25 NOTE — BH Assessment (Signed)
Patients information has been faxed:   [facility]       [facility]     [facility]     [facility]     [facility]   [facility] [phone]) - No beds (per Inetta Fermo)

## 2019-02-25 NOTE — ED Notes (Signed)
Snack and beverage given. 
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
## 2019-02-26 DIAGNOSIS — F250 Schizoaffective disorder, bipolar type: Secondary | ICD-10-CM | POA: Diagnosis not present

## 2019-02-26 LAB — VALPROIC ACID LEVEL: Valproic Acid Lvl: 65 ug/mL (ref 50.0–100.0)

## 2019-02-26 MED ORDER — OLANZAPINE 5 MG PO TBDP
5.0000 mg | ORAL_TABLET | Freq: Once | ORAL | Status: AC
  Administered 2019-02-26: 16:00:00 5 mg via ORAL
  Filled 2019-02-26 (×2): qty 1

## 2019-02-26 MED ORDER — OLANZAPINE 5 MG PO TBDP
5.0000 mg | ORAL_TABLET | Freq: Three times a day (TID) | ORAL | Status: DC | PRN
  Filled 2019-02-26: qty 1

## 2019-02-26 MED ORDER — DIVALPROEX SODIUM 250 MG PO DR TAB
250.0000 mg | DELAYED_RELEASE_TABLET | Freq: Once | ORAL | Status: AC
  Administered 2019-02-26: 11:00:00 250 mg via ORAL
  Filled 2019-02-26: qty 1

## 2019-02-26 MED ORDER — DIVALPROEX SODIUM 250 MG PO DR TAB
750.0000 mg | DELAYED_RELEASE_TABLET | Freq: Two times a day (BID) | ORAL | Status: DC
  Administered 2019-02-26 – 2019-02-27 (×2): 750 mg via ORAL
  Filled 2019-02-26 (×2): qty 3

## 2019-02-26 MED ORDER — OLANZAPINE 5 MG PO TBDP
15.0000 mg | ORAL_TABLET | Freq: Every day | ORAL | Status: DC
  Administered 2019-02-26: 15 mg via ORAL
  Filled 2019-02-26 (×2): qty 1

## 2019-02-26 MED ORDER — OLANZAPINE 5 MG PO TBDP: 15.0000 mg | ORAL_TABLET | Freq: Every day | ORAL | Status: DC

## 2019-02-26 MED ORDER — OLANZAPINE 10 MG PO TBDP: 10.0000 mg | ORAL_TABLET | Freq: Every day | ORAL | Status: DC

## 2019-02-26 NOTE — ED Notes (Signed)
Hourly rounding reveals patient in room. No complaints, stable, in no acute distress. Q15 minute rounds and monitoring via Security Cameras to continue. 

## 2019-02-26 NOTE — ED Notes (Signed)
Patient yelling out and cursing.

## 2019-02-26 NOTE — ED Notes (Signed)
Hourly rounding reveals patient sleeping in room. No complaints, stable, in no acute distress. Q15 minute rounds and monitoring via Security Cameras to continue. 

## 2019-02-26 NOTE — ED Provider Notes (Signed)
-----------------------------------------   4:47 AM on 02/26/2019 -----------------------------------------   Blood pressure (!) 145/90, pulse (!) 116, temperature 98.2 F (36.8 C), temperature source Oral, resp. rate 18, height 6\' 4"  (1.93 m), weight 99.8 kg, SpO2 100 %.  The patient is calm and cooperative at this time.  There have been no acute events since the last update.  Awaiting disposition plan from Behavioral Medicine team. We will have to monitor the patient's heart rate as it is relatively high.   Arnaldo Natal, MD 02/26/19 [phone]

## 2019-02-26 NOTE — ED Notes (Signed)
Hourly rounding reveals patient in day room after yelling out despite redirection. Stable, in no acute distress. Q15 minute rounds and monitoring via [employer] to continue.

## 2019-02-26 NOTE — ED Notes (Signed)
Report to include Situation, Background, Assessment, and Recommendations received from Rhea RN. Patient alert and oriented, warm and dry, in no acute distress. Patient denies SI, HI, AVH and pain. Patient made aware of Q15 minute rounds and security cameras for their safety. Patient instructed to come to me with needs or concerns. 

## 2019-02-26 NOTE — ED Notes (Signed)
Pt approached nurses station stating, "I want my discharge papers and my clothes."  Informed patient that he can't leave until the doctor discharges him.

## 2019-02-26 NOTE — ED Notes (Signed)
Snack and beverage given. 

## 2019-02-26 NOTE — Consult Note (Addendum)
[facility] Psych ED Progress Note  02/26/2019 9:11 AM Oscar Duke  MRN:  [id number] Subjective: Patient is a 48 year old male with a past psychiatric history significant for schizophrenia versus schizoaffective disorder versus bipolar disorder with psychotic features.  He also carries a diagnosis of intellectual deficit disorder.  He was brought to the emergency department on 4/15 secondary to agitation, bad behaviors, and paranoia.  Objective: Patient is seen and examined.  Patient's 48 year old male with the above-stated past psychiatric history who is seen in follow-up in the emergency department.  He remains very labile.  He is focused on talking to his payee and "get my thousand dollars".  He also tenuous to be threatening.  "I am going to beat that she had out of that guy if he keeps yelling at me".  He denied any current auditory or visual hallucinations.  He denied any suicidal or homicidal ideation.  He continues on Depakote, clonazepam, Cogentin, Remeron, Seroquel, trazodone and Prolixin.  Principal Problem: Schizoaffective disorder, bipolar type (HCC) Diagnosis:  Principal Problem:   Schizoaffective disorder, bipolar type (HCC) Active Problems:   Hypertension  Total Time spent with patient: 20 minutes  Past Psychiatric History: See admission consultation  Past Medical History:  Past Medical History:  Diagnosis Date  . Bipolar 1 disorder (HCC)   . Diabetes mellitus without complication (HCC)   . Hyperlipidemia   . Hypertension   . 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 Schizophrenia, acute (HCC)   . Seizures (HCC)     Past Surgical History:  Procedure Laterality Date  . JOINT REPLACEMENT     Family History: No family history on file. Family Psychiatric  History: See admission consultation Social History:  Social History   Substance and Sexual Activity  Alcohol Use No     Social History   Substance and Sexual Activity  Drug Use Yes  . Types:  Marijuana   Comment: quit 5 years ago    Social History   Socioeconomic History  . Marital status: Single    Spouse name: Not on file  . Number of children: Not on file  . Years of education: Not on file  . Highest education level: Not on file  Occupational History  . Not on file  Social Needs  . Financial resource strain: Not on file  . Food insecurity:    Worry: Not on file    Inability: Not on file  . Transportation needs:    Medical: Not on file    Non-medical: Not on file  Tobacco Use  . Smoking status: Never Smoker  . Smokeless tobacco: Never Used  Substance and Sexual Activity  . Alcohol use: No  . Drug use: Yes    Types: Marijuana    Comment: quit 5 years ago  . Sexual activity: Not on file  Lifestyle  . Physical activity:    Days per week: Not on file    Minutes per session: Not on file  . Stress: Not on file  Relationships  . Social connections:    Talks on phone: Not on file    Gets together: Not on file    Attends religious service: Not on file    Active member of club or organization: Not on file    Attends meetings of clubs or organizations: Not on file    Relationship status: Not on file  Other Topics Concern  . Not on file  Social History Narrative  . Not on file  Sleep: Good  Appetite:  Good  Current Medications: Current Facility-Administered Medications  Medication Dose Route Frequency Provider Last Rate Last Dose  . benztropine (COGENTIN) tablet 2 mg  2 mg Oral BID Antonieta Pert, MD   2 mg at 02/26/19 4935  . clonazePAM (KLONOPIN) tablet 2 mg  2 mg Oral BID Antonieta Pert, MD   2 mg at 02/26/19 0910  . divalproex (DEPAKOTE) DR tablet 500 mg  500 mg Oral Q12H Antonieta Pert, MD   500 mg at 02/26/19 0910  . fluPHENAZine (PROLIXIN) tablet 10 mg  10 mg Oral Q8H Antonieta Pert, MD   10 mg at 02/25/19 2113  . lactulose (CHRONULAC) 10 GM/15ML solution 10 g  10 g Oral BID Antonieta Pert, MD   10 g at 02/26/19 0910  .  LORazepam (ATIVAN) tablet 2 mg  2 mg Oral Q6H PRN Antonieta Pert, MD      . mirtazapine (REMERON) tablet 15 mg  15 mg Oral QHS Antonieta Pert, MD   15 mg at 02/25/19 2113  . QUEtiapine (SEROQUEL) tablet 200 mg  200 mg Oral BID Antonieta Pert, MD   200 mg at 02/26/19 0910  . traZODone (DESYREL) tablet 50 mg  50 mg Oral QHS Irean Hong, MD   50 mg at 02/25/19 2112  . ziprasidone (GEODON) injection 20 mg  20 mg Intramuscular Q12H PRN Antonieta Pert, MD       Current Outpatient Medications  Medication Sig Dispense Refill  . acetaminophen (TYLENOL) 500 MG tablet Take 500 mg by mouth every 6 (six) hours as needed for mild pain, moderate pain, fever or headache.    . benztropine (COGENTIN) 2 MG tablet Take 1 tablet (2 mg total) by mouth 2 (two) times daily. 60 tablet 0  . Cholecalciferol (VITAMIN D3) 50 MCG (2000 UT) capsule Take 2,000 Units by mouth daily.    . clonazePAM (KLONOPIN) 1 MG tablet Take 1 tablet (1 mg total) by mouth 4 (four) times daily. 120 tablet 0  . divalproex (DEPAKOTE) 500 MG DR tablet TAKE (1) TABLET BY MOUTH EVERY TWELVE HOURS. (Patient taking differently: Take 500 mg by mouth every 12 (twelve) hours. ) 16 tablet 0  . fluPHENAZine (PROLIXIN) 10 MG tablet Take 1 tablet (10 mg total) by mouth 3 (three) times daily. 90 tablet 0  . ibuprofen (ADVIL,MOTRIN) 600 MG tablet Take 600 mg by mouth every 6 (six) hours as needed for headache or moderate pain.    Marland Kitchen insulin aspart (NOVOLOG) 100 UNIT/ML injection Inject 0-15 Units into the skin 3 (three) times daily with meals.    . insulin detemir (LEVEMIR) 100 UNIT/ML injection Inject 0.28 mLs (28 Units total) into the skin at bedtime. 10 mL 11  . lactulose (CHRONULAC) 10 GM/15ML solution Take 15 mLs (10 g total) by mouth 2 (two) times daily. 600 mL 0  . levETIRAcetam (KEPPRA) 750 MG tablet TAKE (2) TABLETS BY MOUTH ONCE DAILY. (Patient taking differently: Take 1,500 mg by mouth daily. ) 16 tablet 0  . mirtazapine (REMERON  SOL-TAB) 15 MG disintegrating tablet Take 1 tablet (15 mg total) by mouth at bedtime. 30 tablet 2  . pantoprazole (PROTONIX) 40 MG tablet Take 1 tablet (40 mg total) by mouth daily. 30 tablet 0  . perphenazine (TRILAFON) 8 MG tablet Take 1 tablet (8 mg total) by mouth 3 (three) times daily. 90 tablet 0  . QUEtiapine (SEROQUEL) 100 MG tablet Take 1 tablet (100 mg  total) by mouth every 4 (four) hours as needed (anxiety). (Patient taking differently: Take 100 mg by mouth every 4 (four) hours as needed (for anxiety). ) 60 tablet 0  . sodium chloride 1 g tablet Take 1 tablet (1 g total) by mouth daily. 30 tablet 0  . traZODone (DESYREL) 50 MG tablet Take 1 tablet (50 mg total) by mouth at bedtime. 30 tablet 0    Lab Results:  Results for orders placed or performed during the hospital encounter of 02/24/19 (from the past 48 hour(s))  Comprehensive metabolic panel     Status: Abnormal   Collection Time: 02/24/19  6:50 PM  Result Value Ref Range   Sodium 133 (L) 135 - 145 mmol/L   Potassium 3.6 3.5 - 5.1 mmol/L   Chloride 98 98 - 111 mmol/L   CO2 24 22 - 32 mmol/L   Glucose, Bld 111 (H) 70 - 99 mg/dL   BUN 9 6 - 20 mg/dL   Creatinine, Ser 1.61 0.61 - 1.24 mg/dL   Calcium 8.9 8.9 - 09.6 mg/dL   Total Protein 7.3 6.5 - 8.1 g/dL   Albumin 4.2 3.5 - 5.0 g/dL   AST 14 (L) 15 - 41 U/L   ALT 12 0 - 44 U/L   Alkaline Phosphatase 55 38 - 126 U/L   Total Bilirubin 0.4 0.3 - 1.2 mg/dL   GFR calc non Af Amer >60 >60 mL/min   GFR calc Af Amer >60 >60 mL/min   Anion gap 11 5 - 15    Comment: Performed at [facility], [address] [place], [place] [address]  Ethanol     Status: None   Collection Time: 02/24/19  6:50 PM  Result Value Ref Range   Alcohol, Ethyl (B) <10 <10 mg/dL    Comment: (NOTE) Lowest detectable limit for serum alcohol is 10 mg/dL. For medical purposes only. Performed at [facility], [address] [place], [place] [address]   cbc     Status: Abnormal    Collection Time: 02/24/19  6:50 PM  Result Value Ref Range   WBC 8.8 4.0 - 10.5 K/uL   RBC 3.70 (L) 4.22 - 5.81 MIL/uL   Hemoglobin 11.4 (L) 13.0 - 17.0 g/dL   HCT 14.7 (L) 82.9 - 56.2 %   MCV 88.9 80.0 - 100.0 fL   MCH 30.8 26.0 - 34.0 pg   MCHC 34.7 30.0 - 36.0 g/dL   RDW 13.0 86.5 - 78.4 %   Platelets 187 150 - 400 K/uL   nRBC 0.0 0.0 - 0.2 %    Comment: Performed at [facility], [address] [place], [place] [address]  Valproic acid level     Status: None   Collection Time: 02/24/19  6:50 PM  Result Value Ref Range   Valproic Acid Lvl 65 50.0 - 100.0 ug/mL    Comment: Performed at [facility], [address], [place], [place] [address]  Urine Drug Screen, Qualitative     Status: Abnormal   Collection Time: 02/24/19  9:04 PM  Result Value Ref Range   Tricyclic, Ur Screen POSITIVE (A) NONE DETECTED   Amphetamines, Ur Screen NONE DETECTED NONE DETECTED   MDMA (Ecstasy)Ur Screen NONE DETECTED NONE DETECTED   Cocaine Metabolite,Ur [place] NONE DETECTED NONE DETECTED   Opiate, Ur Screen NONE DETECTED NONE DETECTED   Phencyclidine (PCP) Ur S NONE DETECTED NONE DETECTED   Cannabinoid 50 Ng, Ur [place] NONE DETECTED NONE DETECTED  Barbiturates, Ur Screen NONE DETECTED NONE DETECTED   Benzodiazepine, Ur Scrn NONE DETECTED NONE DETECTED   Methadone Scn, Ur NONE DETECTED NONE DETECTED    Comment: (NOTE) Tricyclics + metabolites, urine    Cutoff 1000 ng/mL Amphetamines + metabolites, urine  Cutoff 1000 ng/mL MDMA (Ecstasy), urine              Cutoff 500 ng/mL Cocaine Metabolite, urine          Cutoff 300 ng/mL Opiate + metabolites, urine        Cutoff 300 ng/mL Phencyclidine (PCP), urine         Cutoff 25 ng/mL Cannabinoid, urine                 Cutoff 50 ng/mL Barbiturates + metabolites, urine  Cutoff 200 ng/mL Benzodiazepine, urine              Cutoff 200 ng/mL Methadone, urine                   Cutoff 300 ng/mL The urine drug screen provides only a preliminary,  unconfirmed analytical test result and should not be used for non-medical purposes. Clinical consideration and professional judgment should be applied to any positive drug screen result due to possible interfering substances. A more specific alternate chemical method must be used in order to obtain a confirmed analytical result. Gas chromatography / mass spectrometry (GC/MS) is the preferred confirmat ory method. Performed at [facility], [address] [place], [place] [address]   Ammonia     Status: Abnormal   Collection Time: 02/25/19 10:27 PM  Result Value Ref Range   Ammonia 39 (H) 9 - 35 umol/L    Comment: Performed at [facility], [address] [place], [place] [address]    Blood Alcohol level:  Lab Results  Component Value Date   [facility] <10 02/24/2019   ETH <10 02/23/2019    Physical Findings: AIMS:  , ,  ,  ,    CIWA:    COWS:     Musculoskeletal: Strength & Muscle Tone: within normal limits Gait & Station: normal Patient leans: N/A  Psychiatric Specialty Exam: Physical Exam  Nursing note and vitals reviewed. Constitutional: He appears well-developed and well-nourished.  HENT:  Head: Normocephalic and atraumatic.  Respiratory: Effort normal.  Neurological: He is alert.    ROS  Blood pressure (!) 145/90, pulse (!) 116, temperature 98.2 F (36.8 C), temperature source Oral, resp. rate 18, height  (1.93 m), weight 99.8 kg, SpO2 100 %.Body mass index is 26.78 kg/m.  General Appearance: Casual  Eye Contact:  Good  Speech:  Pressured  Volume:  Increased  Mood:  Anxious, Dysphoric and Irritable  Affect:  Labile  Thought Process:  Goal Directed and Descriptions of Associations: Circumstantial  Orientation:  Full (Time, Place, and Person)  Thought Content:  Delusions and Rumination  Suicidal Thoughts:  No  Homicidal Thoughts:  No  Memory:  Immediate;   Fair Recent;   Fair Remote;   Fair  Judgement:  Impaired  Insight:  Lacking   Psychomotor Activity:  Increased  Concentration:  Concentration: Fair and Attention Span: Fair  Recall:  Poor  Fund of Knowledge:  Poor  Language:  Fair  Akathisia:  Negative  Handed:  Right  AIMS (if indicated):     Assets:  Desire for Improvement Resilience  ADL's:  Intact  Cognition:  Impaired,  Mild  Sleep:  Treatment Plan Summary: Daily contact with patient to assess and evaluate symptoms and progress in treatment, Medication management and Plan : Patient is seen and examined.  Patient is a 48 year old male with the above-stated past psychiatric history who is seen in follow-up in the emergency department.  He remains significantly labile.  His Depakote level on admission was 65.  He remains on Depakote DR 500 mg p.o. every 12 hours.  He also remains on Cogentin, Klonopin, Prolixin, mirtazapine and Seroquel as previously written.  I am going to increase his Depakote to 750 mg p.o. every 12 hours.  If he is already received his 500 mg this morning I will give him 250 mg on top of that and attempt to control his lability.  No other changes in his antipsychotic medication, but I would like to consolidate some of these.  We did discuss the possibility of Clozaril, and he is very concerned about that.  Hopefully we will find an inpatient facility for him today.  Antonieta PertGreg Lawson [place] Fanfan, MD 02/26/2019, 9:11 AM   Patient continues to be intrusive, agitated and aggressive. I am going to go on and stop the seroquel for lack of efficacy. We will see if zyprexa will be any more effective. I will give 2.5 mg po now x 1 and start 10 mg po q hs

## 2019-02-26 NOTE — ED Notes (Signed)
Pt repeatedly at nurses station knocking on door asking for drinks, requesting to use the phone, or talking to staff through the door.

## 2019-02-26 NOTE — BH Assessment (Addendum)
Patient's information has been faxed to the following additional facilities for psychiatric inpatient:   [facility]       [facility]     [facility]     [facility]     [facility]     [facility] - Under review (Per Selena Batten, Intake RN)     [facility]     [facility]     [facility]     [facility]     [facility]     [facility]     [facility]     [facility]     [facility]     [facility]

## 2019-02-27 DIAGNOSIS — F250 Schizoaffective disorder, bipolar type: Secondary | ICD-10-CM | POA: Diagnosis not present

## 2019-02-27 MED ORDER — OLANZAPINE 15 MG PO TBDP: 15.0000 mg | ORAL_TABLET | Freq: Every day | ORAL | 0 refills | Status: DC

## 2019-02-27 MED ORDER — LORAZEPAM 2 MG PO TABS: 2.0000 mg | ORAL_TABLET | Freq: Four times a day (QID) | ORAL | 0 refills | Status: DC | PRN

## 2019-02-27 MED ORDER — FLUPHENAZINE HCL 10 MG PO TABS: 10.0000 mg | ORAL_TABLET | Freq: Three times a day (TID) | ORAL | 0 refills | Status: DC

## 2019-02-27 MED ORDER — OLANZAPINE 5 MG PO TBDP: 5.0000 mg | ORAL_TABLET | Freq: Three times a day (TID) | ORAL | 0 refills | Status: DC | PRN

## 2019-02-27 MED ORDER — LORAZEPAM 2 MG PO TABS: 2.0000 mg | ORAL_TABLET | Freq: Four times a day (QID) | ORAL | 0 refills | Status: AC | PRN

## 2019-02-27 MED ORDER — DIVALPROEX SODIUM 250 MG PO DR TAB: 750.0000 mg | DELAYED_RELEASE_TABLET | Freq: Two times a day (BID) | ORAL | 0 refills | Status: DC

## 2019-02-27 MED ORDER — MIRTAZAPINE 15 MG PO TABS: 15.0000 mg | ORAL_TABLET | Freq: Every day | ORAL | 0 refills | Status: DC

## 2019-02-27 MED ORDER — DIVALPROEX SODIUM 250 MG PO DR TAB: 750.0000 mg | DELAYED_RELEASE_TABLET | Freq: Two times a day (BID) | ORAL | 0 refills | Status: AC

## 2019-02-27 MED ORDER — CLONAZEPAM 2 MG PO TABS: 2.0000 mg | ORAL_TABLET | Freq: Two times a day (BID) | ORAL | 0 refills | Status: AC

## 2019-02-27 NOTE — ED Notes (Signed)
Hourly rounding reveals patient in room. No complaints, stable, in no acute distress. Q15 minute rounds and monitoring via Security Cameras to continue. 

## 2019-02-27 NOTE — ED Provider Notes (Signed)
-----------------------------------------   7:53 AM on 02/27/2019 -----------------------------------------   Blood pressure (!) 158/92, pulse (!) 109, temperature 98.2 F (36.8 C), temperature source Oral, resp. rate 16, height 6\' 4"  (1.93 m), weight 99.8 kg, SpO2 100 %.  The patient is calm and cooperative at this time.  There have been no acute events since the last update.  Awaiting disposition plan from Behavioral Medicine team.    Arnaldo Natal, MD 02/27/19 [id number]

## 2019-02-27 NOTE — ED Notes (Signed)
Patient discharged back to group home, patient received discharge papers. Patient was transferred by [place] [employer] cab service Cab number [phone]. Patient received belongings and verbalized he has received all of his belongings. Patient appropriate and cooperative, Denies SI/HI AVH. Vital signs taken. NAD noted.

## 2019-02-27 NOTE — ED Notes (Signed)
Hourly rounding reveals patient sleeping in room. No complaints, stable, in no acute distress. Q15 minute rounds and monitoring via Security Cameras to continue. 

## 2019-02-27 NOTE — ED Notes (Signed)
Patient in the dayroom with other clients, laughing and watching television

## 2019-02-27 NOTE — ED Notes (Signed)
Spoke to [place] R. at patients group home and she wanted to know if patients medications have been adjusted. She stated that she will allow him to come back to the group home if his medications can be changed if not she does not want him back due to his aggressive behavior and hitting staff.

## 2019-02-27 NOTE — Consult Note (Addendum)
[facility]  02/27/2019 1:21 PM Oscar Duke  MRN:  [id number] Principal Problem: Schizoaffective disorder, bipolar type [facility](HCC) Discharge Diagnoses: Principal Problem:   Schizoaffective disorder, bipolar type [facility](HCC) Active Problems:   Hypertension  Subjective: 48 yo male who presented to the ED from his group home with aggressive behaviors.  His medications were adjusted via the psychiatrist who followed him for the past three days and psychiatric inpatient sought with no success.  However, he stabilized with the new medication changes..  No paranoia noted today, anxious earlier about where he was going to go but decreased.  Frequently at the door with additional food and drink requests.  Not upset about returning to the group home and joking with staff on his way out, no threats to hurt other residents at the facility or staff.  No suicidal ideations or hallucinations.  Oscar LericheMark is well known to this ED and providers, he is at his baseline at this time.  He has cognitive decline with a low threshold for frustration.  When he does not like something, he acts out.  Medications help his behaviors and mood.  Dr Lucianne MussKumar reviewed this client and concurs with the plan.  Collateral:  POA, Oscar Loopavid Duke, reports when he does not get his way, he threatens himself to get to the hospital.  Medications were adjusted at bedtime prior to admission that he thinks set him off.  The group home owner, Oscar NormanLawanda Duke, believes his actions are behavior issues.  She is willing for him to return if he is on Zyprexa as this works for him.  This was started by Dr Jola Babinskilary earlier this week.  Stable for discharge.  Total Time spent with patient: 30 minutes  Past Psychiatric History: schizoaffective disorder  Past Medical History:  Past Medical History:  Diagnosis Date  . Bipolar 1 disorder (HCC)   . Diabetes mellitus without complication (HCC)   . Hyperlipidemia   . Hypertension   . Manic affective disorder with  recurrent episode (HCC)   . Schizophrenia, acute (HCC)   . Seizures (HCC)     Past Surgical History:  Procedure Laterality Date  . JOINT REPLACEMENT     Family History: No family history on file. Family Psychiatric  History: unknown Social History:  Social History   Substance and Sexual Activity  Alcohol Use No     Social History   Substance and Sexual Activity  Drug Use Yes  . Types: Marijuana   Comment: quit 5 years ago    Social History   Socioeconomic History  . Marital status: Single    Spouse name: Not on file  . Number of children: Not on file  . Years of education: Not on file  . Highest education level: Not on file  Occupational History  . Not on file  Social Needs  . Financial resource strain: Not on file  . Food insecurity:    Worry: Not on file    Inability: Not on file  . Transportation needs:    Medical: Not on file    Non-medical: Not on file  Tobacco Use  . Smoking status: Never Smoker  . Smokeless tobacco: Never Used  Substance and Sexual Activity  . Alcohol use: No  . Drug use: Yes    Types: Marijuana    Comment: quit 5 years ago  . Sexual activity: Not on file  Lifestyle  . Physical activity:    Days per week: Not on file    Minutes per  session: Not on file  . Stress: Not on file  Relationships  . Social connections:    Talks on phone: Not on file    Gets together: Not on file    Attends religious service: Not on file    Active member of club or organization: Not on file    Attends meetings of clubs or organizations: Not on file    Relationship status: Not on file  Other Topics Concern  . Not on file  Social History Narrative  . Not on file    Has this patient used any form of tobacco in the last 30 days? (Cigarettes, Smokeless Tobacco, Cigars, and/or Pipes) NA  Current Medications: Current Facility-Administered Medications  Medication Dose Route Frequency Provider Last Rate Last Dose  . benztropine (COGENTIN) tablet 2 mg  2  mg Oral BID Antonieta Pert, MD   2 mg at 02/27/19 1019  . clonazePAM (KLONOPIN) tablet 2 mg  2 mg Oral BID Antonieta Pert, MD   2 mg at 02/27/19 1019  . divalproex (DEPAKOTE) DR tablet 750 mg  750 mg Oral Q12H Antonieta Pert, MD   750 mg at 02/27/19 1019  . fluPHENAZine (PROLIXIN) tablet 10 mg  10 mg Oral Q8H Antonieta Pert, MD   10 mg at 02/27/19 0509  . lactulose (CHRONULAC) 10 GM/15ML solution 10 g  10 g Oral BID Antonieta Pert, MD   10 g at 02/26/19 2110  . LORazepam (ATIVAN) tablet 2 mg  2 mg Oral Q6H PRN Antonieta Pert, MD   2 mg at 02/27/19 0900  . mirtazapine (REMERON) tablet 15 mg  15 mg Oral QHS Antonieta Pert, MD   15 mg at 02/26/19 2111  . OLANZapine zydis (ZYPREXA) disintegrating tablet 15 mg  15 mg Oral QHS Antonieta Pert, MD   15 mg at 02/26/19 2110  . OLANZapine zydis (ZYPREXA) disintegrating tablet 5 mg  5 mg Oral Q8H PRN Antonieta Pert, MD      . traZODone (DESYREL) tablet 50 mg  50 mg Oral QHS Irean Hong, MD   50 mg at 02/26/19 2111   Current Outpatient Medications  Medication Sig Dispense Refill  . benztropine (COGENTIN) 2 MG tablet Take 1 tablet (2 mg total) by mouth 2 (two) times daily. 60 tablet 0  . Cholecalciferol (VITAMIN D3) 50 MCG (2000 UT) capsule Take 2,000 Units by mouth daily.    . insulin aspart (NOVOLOG) 100 UNIT/ML injection Inject 0-15 Units into the skin 3 (three) times daily with meals.    . insulin detemir (LEVEMIR) 100 UNIT/ML injection Inject 0.28 mLs (28 Units total) into the skin at bedtime. 10 mL 11  . lactulose (CHRONULAC) 10 GM/15ML solution Take 15 mLs (10 g total) by mouth 2 (two) times daily. 600 mL 0  . pantoprazole (PROTONIX) 40 MG tablet Take 1 tablet (40 mg total) by mouth daily. 30 tablet 0  . sodium chloride 1 g tablet Take 1 tablet (1 g total) by mouth daily. 30 tablet 0  . traZODone (DESYREL) 50 MG tablet Take 1 tablet (50 mg total) by mouth at bedtime. 30 tablet 0  . clonazePAM (KLONOPIN) 2 MG tablet  Take 1 tablet (2 mg total) by mouth 2 (two) times daily. 60 tablet 0  . divalproex (DEPAKOTE) 250 MG DR tablet Take 3 tablets (750 mg total) by mouth every 12 (twelve) hours. 180 tablet 0  . LORazepam (ATIVAN) 2 MG tablet Take 1 tablet (2 mg  total) by mouth every 6 (six) hours as needed for anxiety (agitation). 31 tablet 0  . mirtazapine (REMERON) 15 MG tablet Take 1 tablet (15 mg total) by mouth at bedtime. 30 tablet 0  . OLANZapine zydis (ZYPREXA) 15 MG disintegrating tablet Take 1 tablet (15 mg total) by mouth at bedtime. 30 tablet 0  . OLANZapine zydis (ZYPREXA) 5 MG disintegrating tablet Take 1 tablet (5 mg total) by mouth every 8 (eight) hours as needed (agitation). 90 tablet 0   PTA Medications: (Not in a hospital admission)   Musculoskeletal: Strength & Muscle Tone: within normal limits Gait & Station: normal Patient leans: N/A  Psychiatric Specialty Exam: Physical Exam  Nursing note and vitals reviewed. Constitutional: He is oriented to person, place, and time. He appears well-developed and well-nourished.  HENT:  Head: Normocephalic.  Neck: Normal range of motion.  Respiratory: Effort normal.  Musculoskeletal: Normal range of motion.  Neurological: He is alert and oriented to person, place, and time.  Psychiatric: His speech is normal and behavior is normal. Thought content normal. His mood appears anxious. His affect is blunt. Cognition and memory are impaired. He expresses impulsivity.    Review of Systems  Musculoskeletal:       Left arm pain  Psychiatric/Behavioral: The patient is nervous/anxious.   All other systems reviewed and are negative.   Blood pressure (!) 158/92, pulse (!) 109, temperature 98.2 F (36.8 C), temperature source Oral, resp. rate 16, height  (1.93 m), weight 99.8 kg, SpO2 100 %.Body mass index is 26.78 kg/m.  General Appearance: Casual  Eye Contact:  Good  Speech:  Normal Rate  Volume:  Normal  Mood:  Anxious, mild  Affect:  Blunt   Thought Process:  Coherent and Descriptions of Associations: Intact  Orientation:  Full (Time, Place, and Person)  Thought Content:  WDL and Logical  Suicidal Thoughts:  No  Homicidal Thoughts:  No  Memory:  Immediate;   Fair Recent;   Fair Remote;   Fair  Judgement:  Fair  Insight:  Fair  Psychomotor Activity:  Normal  Concentration:  Concentration: Good and Attention Span: Good  Recall:  Fair  Fund of Knowledge:  Good  Language:  Good  Akathisia:  No  Handed:  Right  AIMS (if indicated):     Assets:  Housing Leisure Time Physical Health Resilience Social Support  ADL's:  Intact  Cognition:  Impaired,  Mild  Sleep:        Demographic Factors:  Male and Caucasian  Loss Factors: NA  Historical Factors: Impulsivity  Risk Reduction Factors:   Sense of responsibility to family, Living with another person, especially a relative, Positive social support and Positive therapeutic relationship  Continued Clinical Symptoms:  Anxiety, mild  Cognitive Features That Contribute To Risk:  None    Suicide Risk:  Minimal: No identifiable suicidal ideation.  Patients presenting with no risk factors but with morbid ruminations; may be classified as minimal risk based on the severity of the depressive symptoms   Plan Of Care/Follow-up recommendations:  Schizoaffective disorder, bipolar type: -Continued Zyprexa 15 mg at bedtime -Continued Zyprexa 5 mg every 8  Hours PRN agitation -Continued Klonopin 2 mg BID -Continued Lorazepam 2 mg every 6 hours PRN agitation  EPS: -Continued Cogentin 2 mg BID  Seizures: -Continued Depakote 750 mg BID  Insomnia: -Continued Remeron 15 mg at bedtime -Continued Trazodone 50 mg at bedtim  GERD: -Continued pantoprazole 40 mg daily  Constipation: -Continued Lactulose 10 gm BID  Diabetes: -Continued sliding scale Novolog insulin -Continued Levemir 28 units at bedtime  Activity:  as tolerated Diet:  heart healthy diet  Follow up  with outpatient provider and ACT team  Disposition: discharge to his group home  LORD, Catha Nottingham, NP 02/27/2019, 1:21 PM

## 2019-02-27 NOTE — ED Notes (Signed)
Patient became upset with staff, because staff informed him that he can not keep coming to nurses station every 10 minutes, patient also became upset because we didn't have any grape juice and he wanted staff to get some grape juice for him now, patient became upset and began cursing and calling staff "bitches"  Staff explained to patient he can come to the nurses station door 2 times an hour

## 2019-02-27 NOTE — Discharge Instructions (Signed)
Schizoaffective Disorder  Schizoaffective disorder (ScAD) is a mental illness. It causes symptoms that are a mixture of a psychotic disorder (schizophrenia) and a mood (affective) disorder. A psychotic disorder involves losing touch with reality.  ScAD usually occurs in cycles. Periods of severe symptoms may be followed by periods of less severe symptoms or improvement. The illness affects men and women equally, but it usually appears at an earlier age (teenage or early adult years) in men. People who have family members with schizophrenia, bipolar disorder, or ScAD are at higher risk of developing ScAD.  ScAD may interfere with personal relationships or normal daily activities. People with ScAD are at a higher risk for:   Job loss.   Social aloneness (isolation).   Health problems.   Anxiety.   Substance use disorders.   Suicide.  What are the causes?  The exact cause of this condition is not known.  What increases the risk?  The following factors may make you more likely to develop this condition:   Problems during your mother's pregnancy and after your birth, such as:  ? Your mother having the flu (influenza) during the second semester of the pregnancy.  ? Exposure to drugs, alcohol, illnesses, or other poisons (toxins) before birth.  ? Low birth weight.   A brain infection or viral infection.   Problems with brain structure or function.   Having family members with bipolar disorder, ScAD, or schizophrenia.   Substance abuse.   Having been diagnosed with a mental health condition in the past.   Being a victim of neglect or long-term (chronic) abuse.  What are the signs or symptoms?  At any one time, people with ScAD may have psychotic symptoms only or have both psychotic and affective symptoms.  Psychotic symptoms may include:   Hearing, seeing, or feeling things that are not there (hallucinations).   Having fixed, false beliefs (delusions). The delusions usually include being attacked, harassed,  or plotted against (paranoid delusions).   Speaking in a way that makes no sense to others (disorganized speech).   Confusing or odd behavior.   Loss of motivation for normal daily activities, such as self-care.   Withdrawal from social contacts (social isolation).   Lack of emotions.  Affective symptoms may include:   Symptoms similar to major depression, such as:  ? Depressed mood.  ? Loss of interest in activities that are usually pleasurable (anhedonia).  ? Sleeping more or less than normal.  ? Feeling worthless or excessively guilty.  ? Lack of energy or motivation.  ? Trouble concentrating.  ? Eating more or less than usual.  ? Thinking a lot about death or suicide.   Symptoms similar to bipolar mania. Bipolar mania refers to periods of severe elation, irritability, and high energy that are experienced by people who have bipolar disorder. These symptoms may include:  ? Abnormally elevated or irritable mood.  ? Abnormally increased energy or activity.  ? More confidence than normal or feeling that you are able to do anything (grandiosity).  ? Feeling rested after getting less sleep than normal.  ? Being easily distracted.  ? Talking more than usual or feeling pressure to keep talking.  ? Feeling that your thoughts are racing.  ? Engaging in high-risk activities.  How is this diagnosed?  ScAD is diagnosed through an assessment by your health care provider. Your health care provider may refer you to a mental health specialist for evaluation. The mental health specialist:   Will observe and ask   questions about your thoughts, behavior, mood, and ability to function in daily life.   May ask questions about your medical history and use of drugs, including prescription medicines. Certain medical conditions and substances can cause symptoms that resemble ScAD.   May do blood tests and imaging tests.  There are two types of ScAD:   Depressive ScAD. This type is diagnosed when you have only depressive  symptoms.   Bipolar ScAD. This type is diagnosed if your affective symptoms are only manic or are a mixture of manic and depressive.  How is this treated?  ScAD is usually a lifelong (chronic) illness that requires long-term treatment. Treatment may include:   Medicine. Different types of medicine are used to treat ScAD. The exact combination depends on the type and severity of your symptoms.  ? Antipsychotic medicine may be used to control psychotic symptoms such as delusions, paranoia, and hallucinations.  ? Mood stabilizers may be used to balance the highs and lows of bipolar manic mood swings.  ? Antidepressant medicines may be used to treat depressive symptoms.   Counseling or talk therapy. Individual, group, or family counseling may be helpful in providing education, support, and guidance. Many people with ScAD also benefit from social skills and job skills (vocational) training.  A combination of medicine and counseling is usually best for managing the disorder over time. A procedure in which electricity is applied to the brain through the scalp (electroconvulsive therapy) may be used to treat people with severe manic symptoms who do not respond to medicine and counseling.  Follow these instructions at home:     Take over-the-counter and prescription medicines only as told by your health care provider. Check with your health care provider before starting new medicines.   Surround yourself with people who care about you and can help you manage your condition.   Keep stress under control. Stress may make symptoms worse.   Avoid alcohol and drugs. They can affect how medicine works and make symptoms worse.   Keep all follow-up visits as told by your health care provider and counselor. This is important.  Contact a health care provider if:   You are not able to take your medicines as prescribed.   Your symptoms get worse.  Get help right away if:   You feel out of control.   You or others notice  warning signs of suicide, such as:  ? Increased use of drugs or alcohol  ? Expressing feelings of not having a purpose in life or feeling trapped, guilty, anxious, agitated, or hopeless.  ? Withdrawing from friends and family.  ? Showing uncontrolled anger, recklessness, and dramatic mood changes.  ? Talking about suicide or searching for methods.  If you ever feel like you may hurt yourself or others, or have thoughts about taking your own life, get help right away. You can go to your nearest emergency department or call:   Your local emergency services (911 in the [place]   A suicide crisis helpline, such as the National Suicide Prevention Lifeline at 1-800-273-8255. This is open 24 hours a day.  Summary   Schizoaffective disorder causes symptoms that are a mixture of a psychotic disorder and a mood disorder.   A combination of medicine and counseling is usually best for managing the disorder over time.   People who have schizoaffective disorder are at risk for suicide. Get help right away if you or someone else notices warning signs of suicide.  This information is not   intended to replace advice given to you by your health care provider. Make sure you discuss any questions you have with your health care provider.  Document Released: 03/11/2007 Document Revised: 08/10/2016 Document Reviewed: 08/10/2016  Elsevier Interactive Patient Education  2019 [employer]

## 2019-02-27 NOTE — ED Notes (Addendum)
Attempted to call Mrs. Lawanda  owner of group home [phone] and left a HIPPA compliant message to call us about patient.

## 2019-02-27 NOTE — ED Notes (Signed)
Hourly rounding reveals patient in day room. No complaints, stable, in no acute distress. Q15 minute rounds and monitoring via Security Cameras to continue. 

## 2019-02-27 NOTE — ED Notes (Signed)
BEHAVIORAL HEALTH ROUNDING Patient sleeping: No. Patient alert and oriented: yes Behavior appropriate: Yes.  ; If no, describe:  Nutrition and fluids offered: yes Toileting and hygiene offered: Yes  Sitter present: q15 minute observations and security monitoring Law enforcement present: Yes    

## 2019-02-27 NOTE — ED Notes (Signed)
Spoke with Randell Loop, patients HPOA, and he stated that he was shocked that the group home was going to take patient back, due to patients aggressive behavior, Mr. Leitha Bleak said this aggressive behavior was new for client. Mr. Leitha Bleak said he has been trying to positively talk with client, to get client "on the right track" Mr. Leitha Bleak said that Onalee Hua knows what to do and say to come to the hospital, he knows to say "I want to hurt myself" to get to the hospital.

## 2019-02-28 ENCOUNTER — Emergency Department: Payer: Medicare Other

## 2019-02-28 ENCOUNTER — Emergency Department
Admission: EM | Admit: 2019-02-28 | Discharge: 2019-03-05 | Disposition: A | Discharge status: Other Acute Inpatient Hospital | Payer: Medicare Other | Attending: Emergency Medicine | Admitting: Emergency Medicine

## 2019-02-28 DIAGNOSIS — R4182 Altered mental status, unspecified: Secondary | ICD-10-CM

## 2019-02-28 DIAGNOSIS — Z79899 Other long term (current) drug therapy: Secondary | ICD-10-CM | POA: Insufficient documentation

## 2019-02-28 DIAGNOSIS — F209 Schizophrenia, unspecified: Secondary | ICD-10-CM | POA: Diagnosis not present

## 2019-02-28 DIAGNOSIS — F250 Schizoaffective disorder, bipolar type: Secondary | ICD-10-CM | POA: Diagnosis not present

## 2019-02-28 DIAGNOSIS — F79 Unspecified intellectual disabilities: Secondary | ICD-10-CM | POA: Insufficient documentation

## 2019-02-28 DIAGNOSIS — F319 Bipolar disorder, unspecified: Secondary | ICD-10-CM | POA: Insufficient documentation

## 2019-02-28 DIAGNOSIS — Z794 Long term (current) use of insulin: Secondary | ICD-10-CM | POA: Diagnosis not present

## 2019-02-28 DIAGNOSIS — R456 Violent behavior: Secondary | ICD-10-CM | POA: Diagnosis not present

## 2019-02-28 DIAGNOSIS — R4689 Other symptoms and signs involving appearance and behavior: Secondary | ICD-10-CM | POA: Diagnosis not present

## 2019-02-28 DIAGNOSIS — E119 Type 2 diabetes mellitus without complications: Secondary | ICD-10-CM | POA: Insufficient documentation

## 2019-02-28 LAB — URINE DRUG SCREEN, QUALITATIVE (ARMC ONLY)
Amphetamines, Ur Screen: NOT DETECTED
Barbiturates, Ur Screen: NOT DETECTED
Benzodiazepine, Ur Scrn: POSITIVE — AB
Cannabinoid 50 Ng, Ur ~~LOC~~: NOT DETECTED
Cocaine Metabolite,Ur ~~LOC~~: NOT DETECTED
MDMA (Ecstasy)Ur Screen: NOT DETECTED
Methadone Scn, Ur: NOT DETECTED
Opiate, Ur Screen: NOT DETECTED
Phencyclidine (PCP) Ur S: NOT DETECTED
Tricyclic, Ur Screen: POSITIVE — AB

## 2019-02-28 LAB — CBC WITH DIFFERENTIAL/PLATELET
Abs Immature Granulocytes: 0.04 10*3/uL (ref 0.00–0.07)
Basophils Absolute: 0.1 10*3/uL (ref 0.0–0.1)
Basophils Relative: 1 %
Eosinophils Absolute: 0.2 10*3/uL (ref 0.0–0.5)
Eosinophils Relative: 2 %
HCT: 32.4 % — ABNORMAL LOW (ref 39.0–52.0)
Hemoglobin: 10.9 g/dL — ABNORMAL LOW (ref 13.0–17.0)
Immature Granulocytes: 1 %
Lymphocytes Relative: 43 %
Lymphs Abs: 3.4 10*3/uL (ref 0.7–4.0)
MCH: 30.5 pg (ref 26.0–34.0)
MCHC: 33.6 g/dL (ref 30.0–36.0)
MCV: 90.8 fL (ref 80.0–100.0)
Monocytes Absolute: 0.7 10*3/uL (ref 0.1–1.0)
Monocytes Relative: 9 %
Neutro Abs: 3.5 10*3/uL (ref 1.7–7.7)
Neutrophils Relative %: 44 %
Platelets: 198 10*3/uL (ref 150–400)
RBC: 3.57 MIL/uL — ABNORMAL LOW (ref 4.22–5.81)
RDW: 13.6 % (ref 11.5–15.5)
WBC: 7.8 10*3/uL (ref 4.0–10.5)
nRBC: 0 % (ref 0.0–0.2)

## 2019-02-28 LAB — URINALYSIS, COMPLETE (UACMP) WITH MICROSCOPIC
Bacteria, UA: NONE SEEN
Bilirubin Urine: NEGATIVE
Glucose, UA: NEGATIVE mg/dL
Hgb urine dipstick: NEGATIVE
Ketones, ur: NEGATIVE mg/dL
Leukocytes,Ua: NEGATIVE
Nitrite: NEGATIVE
Protein, ur: NEGATIVE mg/dL
Specific Gravity, Urine: 1.004 — ABNORMAL LOW (ref 1.005–1.030)
Squamous Epithelial / LPF: NONE SEEN (ref 0–5)
pH: 6 (ref 5.0–8.0)

## 2019-02-28 LAB — COMPREHENSIVE METABOLIC PANEL
ALT: 11 U/L (ref 0–44)
AST: 17 U/L (ref 15–41)
Albumin: 3.7 g/dL (ref 3.5–5.0)
Alkaline Phosphatase: 49 U/L (ref 38–126)
Anion gap: 10 (ref 5–15)
BUN: 9 mg/dL (ref 6–20)
CO2: 24 mmol/L (ref 22–32)
Calcium: 8.7 mg/dL — ABNORMAL LOW (ref 8.9–10.3)
Chloride: 96 mmol/L — ABNORMAL LOW (ref 98–111)
Creatinine, Ser: 0.67 mg/dL (ref 0.61–1.24)
GFR calc Af Amer: >60 mL/min (ref >60)
GFR calc non Af Amer: >60 mL/min (ref >60)
Glucose, Bld: 88 mg/dL (ref 70–99)
Potassium: 3.3 mmol/L — ABNORMAL LOW (ref 3.5–5.1)
Sodium: 130 mmol/L — ABNORMAL LOW (ref 135–145)
Total Bilirubin: 0.5 mg/dL (ref 0.3–1.2)
Total Protein: 6.7 g/dL (ref 6.5–8.1)

## 2019-02-28 LAB — ETHANOL: Alcohol, Ethyl (B): <10 mg/dL (ref <10)

## 2019-02-28 LAB — ACETAMINOPHEN LEVEL: Acetaminophen (Tylenol), Serum: <10 ug/mL — ABNORMAL LOW (ref 10–30)

## 2019-02-28 LAB — SALICYLATE LEVEL: Salicylate Lvl: <7 mg/dL (ref 2.8–30.0)

## 2019-02-28 MED ORDER — ZIPRASIDONE MESYLATE 20 MG IM SOLR
20.0000 mg | Freq: Once | INTRAMUSCULAR | Status: AC
  Administered 2019-02-28: 20 mg via INTRAMUSCULAR
  Filled 2019-02-28: qty 20

## 2019-02-28 NOTE — ED Triage Notes (Signed)
Patient to ED voluntarily for behavioral eval.  Patient given ride by [facility].  Deputy reports he brought him earlier in the week for similar complaint.  Per Deputy patient is not acting his normal self and seem to may have some respiratory issues and being unsteady.

## 2019-02-28 NOTE — ED Provider Notes (Signed)
[facility] Emergency Department Provider Note  ____________________________________________  Time seen: Approximately 10:04 PM  I have reviewed the triage vital signs and the nursing notes.   HISTORY  Chief Complaint Altered Mental Status  Level 5 caveat: Patient with intellectual disability, unable to make decisions for himself.  Patient is somnolent, difficult to arouse at this time.  HPI Oscar Duke is a 48 y.o. male who presents to the emergency department  in care of law enforcement.  Sheriff's department presents the emergency department with the patient after being contacted as the patient had left his group home.  Patient has significant intellectual disability with bipolar disorder, schizophrenia, history of diabetes, hypertension and seizures.  Patient frequently leaves his group home and law enforcement are very familiar with this individual.  Patient typically has presented to the emergency department with manic episodes with his bipolar disorder.  Tonight, [employer] states that the patient is altered and not his normal presentation.  Typically patient is either at his baseline, or manic.  Tonight, patient has appeared drowsy, sluggish, slow to respond.  According to law enforcement, they will be opening an Adult Protective Services case on the patient.  Patient is somnolent, difficult to arouse.  He will not follow instructions.  When asked questions, patient mutters mostly incomprehensible words.        Past Medical History:  Diagnosis Date  . Bipolar 1 disorder (HCC)   . Diabetes mellitus without complication (HCC)   . Hyperlipidemia   . Hypertension   . 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 Schizophrenia, acute (HCC)   . Seizures [facility]     Patient Active Problem List   Diagnosis Date Noted  . 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 Water intoxication syndrome 07/24/2018  . GERD  (gastroesophageal reflux disease) 07/20/2018  . Increased ammonia level 07/09/2018  . Hyponatremia 05/28/2018  . Schizoaffective disorder, bipolar type (HCC) 05/14/2018  . Somnolence 10/05/2017  . Hypertension   . Diabetes mellitus without complication (HCC)   . Seizures (HCC)     Past Surgical History:  Procedure Laterality Date  . JOINT REPLACEMENT      Prior to Admission medications   Medication Sig Start Date End Date Taking? Authorizing Provider  benztropine (COGENTIN) 2 MG tablet Take 1 tablet (2 mg total) by mouth 2 (two) times daily. 08/06/18   Clapacs, Jackquline Denmark, MD  Cholecalciferol (VITAMIN D3) 50 MCG (2000 UT) capsule Take 2,000 Units by mouth daily.    [provider]  clonazePAM (KLONOPIN) 2 MG tablet Take 1 tablet (2 mg total) by mouth 2 (two) times daily. 02/27/19   Charm Rings, NP  divalproex (DEPAKOTE) 250 MG DR tablet Take 3 tablets (750 mg total) by mouth every 12 (twelve) hours. 02/27/19   Charm Rings, NP  insulin aspart (NOVOLOG) 100 UNIT/ML injection Inject 0-15 Units into the skin 3 (three) times daily with meals.    [provider]  insulin detemir (LEVEMIR) 100 UNIT/ML injection Inject 0.28 mLs (28 Units total) into the skin at bedtime. 08/06/18   Clapacs, Jackquline Denmark, MD  lactulose (CHRONULAC) 10 GM/15ML solution Take 15 mLs (10 g total) by mouth 2 (two) times daily. 08/06/18   Clapacs, Jackquline Denmark, MD  LORazepam (ATIVAN) 2 MG tablet Take 1 tablet (2 mg total) by mouth every 6 (six) hours as needed for anxiety (agitation). 02/27/19   Charm Rings, NP  mirtazapine (REMERON) 15 MG tablet Take 1  tablet (15 mg total) by mouth at bedtime. 02/27/19   Charm Rings, NP  OLANZapine zydis (ZYPREXA) 15 MG disintegrating tablet Take 1 tablet (15 mg total) by mouth at bedtime. 02/27/19   Charm Rings, NP  OLANZapine zydis (ZYPREXA) 5 MG disintegrating tablet Take 1 tablet (5 mg total) by mouth every 8 (eight) hours as needed (agitation). 02/27/19   Charm Rings, NP  pantoprazole (PROTONIX) 40 MG tablet Take 1 tablet (40 mg total) by mouth daily. 08/06/18   Clapacs, Jackquline Denmark, MD  sodium chloride 1 g tablet Take 1 tablet (1 g total) by mouth daily. 08/06/18   Clapacs, Jackquline Denmark, MD  traZODone (DESYREL) 50 MG tablet Take 1 tablet (50 mg total) by mouth at bedtime. 01/10/19   Reggie Pile, MD    Allergies Haldol [haloperidol lactate]; Inderal [propranolol]; Lithium; Prozac [fluoxetine hcl]; Thorazine [chlorpromazine]; and Januvia [sitagliptin]  No family history on file.  Social History Social History   Tobacco Use  . Smoking status: Never Smoker  . Smokeless tobacco: Never Used  Substance Use Topics  . Alcohol use: No  . Drug use: Yes    Types: Marijuana    Comment: quit 5 years ago     Review of Systems provided by law enforcement Constitutional: Appears sluggish, drowsy.  Altered from typical baseline. Musculoskeletal: Low enforcement reports no evident trauma to musculoskeletal system.  On initial contact, patient was ambulating on own. Neurological: Per law enforcement, patient has appeared drowsy/somnolent.  Difficult to arouse her weight. Psychological: Patient altered from baseline per Hydrographic surveyor. 10-point ROS otherwise negative.  ____________________________________________   PHYSICAL EXAM:  VITAL SIGNS: ED Triage Vitals  Enc Vitals Group     BP 02/28/19 2021 (!) 152/104     Pulse Rate 02/28/19 2021 (!) 113     Resp 02/28/19 2021 20     Temp 02/28/19 2040 97.9 F (36.6 C)     Temp Source 02/28/19 2040 Oral     SpO2 02/28/19 2021 98 %     Weight --      Height --      Head Circumference --      Peak Flow --      Pain Score 02/28/19 2022 0     Pain Loc --      Pain Edu? --      Excl. in GC? --      Constitutional: Patient appears drowsy and somnolent.  Patient will respond to his name but does not appear oriented..  Patient in no significant distress. Eyes: Conjunctivae are normal. PERRL. EOMI. Head:  Atraumatic. Neck: No stridor.   Hematological/Lymphatic/Immunilogical: No cervical lymphadenopathy. Cardiovascular: Normal rate, regular rhythm. Normal S1 and S2.  Good peripheral circulation. Respiratory: Normal respiratory effort without tachypnea or retractions. Lungs CTAB. Good air entry to the bases with no decreased or absent breath sounds. Gastrointestinal: Bowel sounds 4 quadrants. Soft and nontender to palpation. No guarding or rigidity. No palpable masses. No distention Musculoskeletal: Full range of motion to all extremities. No gross deformities appreciated. Neurologic: Patient muttering incomprehensible sounds and inappropriate words.. No gross focal neurologic deficits are appreciated.  DTRs intact. Skin:  Skin is warm, dry and intact. No rash noted. Psychiatric: Mood and affect is altered/subdued according to Hydrographic surveyor familiar with patient..  Patient is muttering incomprehensible sounds and inappropriate words.  GCS: 10 (3, 3, 4)   ____________________________________________   LABS (all labs ordered are listed, but only abnormal results are displayed)  Labs Reviewed  CBC WITH DIFFERENTIAL/PLATELET  COMPREHENSIVE METABOLIC PANEL  ETHANOL  URINE DRUG SCREEN, QUALITATIVE ([facility] ONLY)  URINALYSIS, COMPLETE (UACMP) WITH MICROSCOPIC  ACETAMINOPHEN LEVEL  SALICYLATE LEVEL   ____________________________________________  EKG   ____________________________________________  RADIOLOGY I personally viewed and evaluated these images as part of my medical decision making, as well as reviewing the written report by the radiologist.  Ct Head Wo Contrast  Result Date: 02/28/2019 CLINICAL DATA:  Av role evaluation EXAM: CT HEAD WITHOUT CONTRAST TECHNIQUE: Contiguous axial images were obtained from the base of the skull through the vertex without intravenous contrast. COMPARISON:  01/09/2019 FINDINGS: Brain: Ventriculomegaly is again identified. Mild atrophic  changes are noted. No findings to suggest acute hemorrhage, acute infarction or space-occupying mass lesion are noted. Vascular: No hyperdense vessel or unexpected calcification. Skull: Normal. Negative for fracture or focal lesion. Sinuses/Orbits: No acute finding. Other: None. IMPRESSION: Atrophy, no acute abnormality is noted. Electronically Signed   By: Alcide CleverMark  Lukens M.D.   On: 02/28/2019 21:30    ____________________________________________    PROCEDURES  Procedure(s) performed:    Procedures    Medications - No data to display   ____________________________________________   INITIAL IMPRESSION / ASSESSMENT AND PLAN / ED COURSE  Pertinent labs & imaging results that were available during my care of the patient were reviewed by me and considered in my medical decision making (see chart for details).  Review of the [place] CSRS was performed in accordance of the [facility] prior to dispensing any controlled drugs.           Patient presented to the emergency department in custody of law enforcement.  Per Hydrographic surveyorlaw enforcement officer who is very familiar with the patient, patient wandered away from his group home, was found by deputies and was altered from his baseline.  Typically during law enforcement encounter he is either at baseline or appears manic.  On assessment, patient appears drowsy/somnolent.  The patient is unable to answer basic questions.  On exam, there is no obvious signs of trauma.  Physical exam reveals no significant gross findings.  [employer] managerenforcement deputy familiar with the patient states that he is not at baseline.  At this time, patient will be evaluated with labs and imaging.  Work-up is still pending at this time.  Patient care will be transferred to attending provider, Dr. Don PerkingVeronese.     This chart was dictated using voice recognition software/Dragon. Despite best efforts to proofread, errors can occur which can change the meaning. Any change was purely  unintentional.    Racheal PatchesCuthriell, Kaiah Hosea D, PA-C 02/28/19 2217    Nita SickleVeronese, [place], MD 02/28/19 2322

## 2019-03-01 DIAGNOSIS — R4182 Altered mental status, unspecified: Secondary | ICD-10-CM | POA: Diagnosis not present

## 2019-03-01 LAB — GLUCOSE, CAPILLARY
Glucose-Capillary: 106 mg/dL — ABNORMAL HIGH (ref 70–99)
Glucose-Capillary: 141 mg/dL — ABNORMAL HIGH (ref 70–99)
Glucose-Capillary: 138 mg/dL — ABNORMAL HIGH (ref 70–99)

## 2019-03-01 MED ORDER — PANTOPRAZOLE SODIUM 40 MG PO TBEC
40.0000 mg | DELAYED_RELEASE_TABLET | Freq: Every day | ORAL | Status: DC
  Administered 2019-03-01 – 2019-03-05 (×5): 40 mg via ORAL
  Filled 2019-03-01 (×5): qty 1

## 2019-03-01 MED ORDER — INSULIN DETEMIR 100 UNIT/ML ~~LOC~~ SOLN
28.0000 [IU] | Freq: Every day | SUBCUTANEOUS | Status: DC
  Administered 2019-03-01 – 2019-03-04 (×4): 28 [IU] via SUBCUTANEOUS
  Filled 2019-03-01 (×6): qty 0.28

## 2019-03-01 MED ORDER — CLONAZEPAM 1 MG PO TABS
2.0000 mg | ORAL_TABLET | Freq: Two times a day (BID) | ORAL | Status: DC
  Administered 2019-03-01 – 2019-03-05 (×9): 2 mg via ORAL
  Filled 2019-03-01: qty 4
  Filled 2019-03-01: qty 2
  Filled 2019-03-01: qty 4
  Filled 2019-03-01 (×2): qty 2
  Filled 2019-03-01: qty 4
  Filled 2019-03-01 (×3): qty 2

## 2019-03-01 MED ORDER — INSULIN ASPART 100 UNIT/ML ~~LOC~~ SOLN: 0.0000 [IU] | Freq: Every day | SUBCUTANEOUS | Status: DC

## 2019-03-01 MED ORDER — SODIUM CHLORIDE 1 G PO TABS
1.0000 g | ORAL_TABLET | Freq: Every day | ORAL | Status: DC
  Administered 2019-03-01 – 2019-03-05 (×5): 1 g via ORAL
  Filled 2019-03-01 (×6): qty 1

## 2019-03-01 MED ORDER — LACTULOSE 10 GM/15ML PO SOLN
10.0000 g | Freq: Two times a day (BID) | ORAL | Status: DC
  Administered 2019-03-01 – 2019-03-05 (×9): 10 g via ORAL
  Filled 2019-03-01 (×13): qty 30

## 2019-03-01 MED ORDER — INSULIN ASPART 100 UNIT/ML ~~LOC~~ SOLN
0.0000 [IU] | Freq: Three times a day (TID) | SUBCUTANEOUS | Status: DC
  Administered 2019-03-01 – 2019-03-02 (×2): 3 [IU] via SUBCUTANEOUS
  Administered 2019-03-03: 17:00:00 4 [IU] via SUBCUTANEOUS
  Filled 2019-03-01 (×3): qty 1

## 2019-03-01 MED ORDER — TRAZODONE HCL 50 MG PO TABS
50.0000 mg | ORAL_TABLET | Freq: Every day | ORAL | Status: DC
  Administered 2019-03-01: 22:00:00 50 mg via ORAL
  Filled 2019-03-01: qty 1

## 2019-03-01 MED ORDER — OLANZAPINE 5 MG PO TBDP
5.0000 mg | ORAL_TABLET | Freq: Three times a day (TID) | ORAL | Status: DC | PRN
  Administered 2019-03-02: 01:00:00 5 mg via ORAL
  Filled 2019-03-01 (×4): qty 1

## 2019-03-01 MED ORDER — INSULIN ASPART 100 UNIT/ML ~~LOC~~ SOLN: 0.0000 [IU] | Freq: Three times a day (TID) | SUBCUTANEOUS | Status: DC

## 2019-03-01 MED ORDER — BENZTROPINE MESYLATE 1 MG PO TABS
2.0000 mg | ORAL_TABLET | Freq: Two times a day (BID) | ORAL | Status: DC
  Administered 2019-03-01 – 2019-03-02 (×3): 2 mg via ORAL
  Filled 2019-03-01 (×3): qty 2

## 2019-03-01 MED ORDER — VITAMIN D 25 MCG (1000 UNIT) PO TABS
2000.0000 [IU] | ORAL_TABLET | Freq: Every day | ORAL | Status: DC
  Administered 2019-03-01 – 2019-03-05 (×5): 2000 [IU] via ORAL
  Filled 2019-03-01 (×5): qty 2

## 2019-03-01 MED ORDER — LORAZEPAM 2 MG PO TABS
2.0000 mg | ORAL_TABLET | Freq: Once | ORAL | Status: AC
  Administered 2019-03-01: 04:00:00 2 mg via ORAL
  Filled 2019-03-01: qty 1

## 2019-03-01 NOTE — ED Notes (Signed)
Report received on patient from [place], [place] awaiting pt to be brought to rm 23.

## 2019-03-01 NOTE — ED Notes (Signed)
Pt returned to bed with assistance.

## 2019-03-01 NOTE — ED Notes (Addendum)
Pt continues to attempt to get out of the bed. Pt yelling and cursing at staff. Threatening to "f you up". Dr. Dolores Frame informed and will order medications for pt.

## 2019-03-01 NOTE — ED Notes (Signed)
Report given to [facility].  Pt speaking with doctor now.

## 2019-03-01 NOTE — ED Notes (Signed)
Hourly rounding reveals patient in day room. No complaints, stable, in no acute distress. Q15 minute rounds and monitoring via Security Cameras to continue. 

## 2019-03-01 NOTE — ED Provider Notes (Signed)
-----------------------------------------   6:16 AM on 03/01/2019 -----------------------------------------   Blood pressure (!) 145/89, pulse 84, temperature 97.9 F (36.6 C), temperature source Oral, resp. rate 20, SpO2 100 %.  The patient is awake at this time.  Will place consult to [facility] psychiatry.  Awaiting disposition plan from Behavioral Medicine team.   Irean Hong, MD 03/01/19 [phone]

## 2019-03-01 NOTE — ED Provider Notes (Signed)
Psychiatry recommends patient continue his current medication regimen.  Psychiatry advises okay to discharge back to his current home setting once issues with group home (they report don't want to take him back presently) are resolved.    Sharyn Creamer, MD 03/01/19 1436
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
## 2019-03-01 NOTE — ED Notes (Signed)
Pt attempting to climb out of the bed. Pt stating he needs to urinate. Assisted up to [facility] x 2 assist.

## 2019-03-01 NOTE — ED Notes (Signed)
Pt to rm 23 from rm 15. Pt waving to this RN and ODS officer as he is rolled by in wheelchair. Pt talking and states he does not feel good. Pt then yelling out shut up to the staff. Pt placed in the bed but continues to try to get up. Will ask for sitter for safety. Charge RN Lea informed.
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
## 2019-03-01 NOTE — ED Notes (Signed)
Pt given medication because he will not allow staff to draw bloodwork. EDP at bedside.

## 2019-03-01 NOTE — ED Notes (Signed)
Vol, moved to [facility]  

## 2019-03-01 NOTE — ED Notes (Signed)
Hourly rounding reveals patient in room. No complaints, stable, in no acute distress. Q15 minute rounds and monitoring via Security Cameras to continue. 

## 2019-03-01 NOTE — ED Notes (Signed)
Vol, moved to [employer]

## 2019-03-01 NOTE — ED Notes (Signed)
Attempted to call Ms. Oscar Duke from group home. No answer. Unable to leave message. [phone]
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
## 2019-03-01 NOTE — ED Notes (Signed)
Pt sleeping. Bed alarm on.

## 2019-03-01 NOTE — ED Notes (Signed)
This tech is at the bedside of pt. Pt consistently tries to get out of bed. Pt goes in and out of sleep. States "he has to pee". Condom catheter in place. Pt repositioned in bed.

## 2019-03-01 NOTE — ED Notes (Signed)
Pt belongings placed in bag: one blue t-shirt, one pair of blue shorts, one pair of brown boots, one beige visor, and a silver coffee cup with a lid.

## 2019-03-01 NOTE — ED Notes (Signed)
EDP would like pt to be moved to quad.

## 2019-03-01 NOTE — Social Work (Signed)
TOC CM/SW received call from pt's nurse requesting assistance with discharge back to group home.  CSW will review chart along w/psychiatric consult notes. CSW will call pt's guardian and group home to arrange pick-up.  Larwance Rote, MSW, LCSW  [phone] 8am-6pm (weekends) or CSW ED # [phone]

## 2019-03-01 NOTE — ED Notes (Signed)
This Clinical research associate informed by Jimmye Norman that the group home will not be accepting the patient back. EDP and social work made aware.

## 2019-03-01 NOTE — ED Notes (Signed)
RN left HIPPA compliant message for Ms. Oscar Duke.  Requested return phone call.
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
## 2019-03-01 NOTE — ED Notes (Signed)
Patient yelling out in room since he has to wait for 30 minutes for and additional drink.

## 2019-03-01 NOTE — ED Notes (Signed)
Pt. screaming out "this place sucks" when told snacks are at 2000.

## 2019-03-01 NOTE — ED Notes (Signed)
Report given to [place], [place].

## 2019-03-01 NOTE — ED Notes (Signed)
Hourly rounding reveals patient in day room. No complaints, stable, in no acute distress. Q15 minute rounds and monitoring via Security Cameras to continue. Snack and beverage given. 

## 2019-03-02 DIAGNOSIS — F250 Schizoaffective disorder, bipolar type: Secondary | ICD-10-CM | POA: Diagnosis not present

## 2019-03-02 DIAGNOSIS — R4689 Other symptoms and signs involving appearance and behavior: Secondary | ICD-10-CM | POA: Diagnosis not present

## 2019-03-02 DIAGNOSIS — R4182 Altered mental status, unspecified: Secondary | ICD-10-CM | POA: Diagnosis not present

## 2019-03-02 LAB — GLUCOSE, CAPILLARY
Glucose-Capillary: 109 mg/dL — ABNORMAL HIGH (ref 70–99)
Glucose-Capillary: 122 mg/dL — ABNORMAL HIGH (ref 70–99)
Glucose-Capillary: 183 mg/dL — ABNORMAL HIGH (ref 70–99)

## 2019-03-02 MED ORDER — BENZTROPINE MESYLATE 1 MG PO TABS
2.0000 mg | ORAL_TABLET | Freq: Two times a day (BID) | ORAL | Status: DC
  Administered 2019-03-03 – 2019-03-05 (×5): 2 mg via ORAL
  Filled 2019-03-02 (×5): qty 2

## 2019-03-02 MED ORDER — LORAZEPAM 2 MG PO TABS
2.0000 mg | ORAL_TABLET | Freq: Once | ORAL | Status: AC
  Administered 2019-03-02: 2 mg via ORAL
  Filled 2019-03-02: qty 1

## 2019-03-02 MED ORDER — DIVALPROEX SODIUM ER 250 MG PO TB24
2000.0000 mg | ORAL_TABLET | Freq: Every day | ORAL | Status: AC
  Administered 2019-03-02: 2000 mg via ORAL
  Filled 2019-03-02: qty 8

## 2019-03-02 MED ORDER — OLANZAPINE 10 MG PO TABS
10.0000 mg | ORAL_TABLET | Freq: Once | ORAL | Status: DC
  Filled 2019-03-02: qty 1

## 2019-03-02 MED ORDER — OLANZAPINE 10 MG PO TABS
10.0000 mg | ORAL_TABLET | Freq: Once | ORAL | Status: AC
  Administered 2019-03-02: 13:00:00 10 mg via ORAL

## 2019-03-02 MED ORDER — DIVALPROEX SODIUM ER 500 MG PO TB24
1500.0000 mg | ORAL_TABLET | Freq: Every day | ORAL | Status: DC
  Administered 2019-03-03 – 2019-03-04 (×2): 1500 mg via ORAL
  Filled 2019-03-02 (×2): qty 3

## 2019-03-02 MED ORDER — ZIPRASIDONE HCL 20 MG PO CAPS
40.0000 mg | ORAL_CAPSULE | Freq: Two times a day (BID) | ORAL | Status: DC
  Administered 2019-03-02 – 2019-03-05 (×6): 40 mg via ORAL
  Filled 2019-03-02 (×3): qty 2
  Filled 2019-03-02: qty 1
  Filled 2019-03-02 (×2): qty 2
  Filled 2019-03-02: qty 1

## 2019-03-02 MED ORDER — ZIPRASIDONE MESYLATE 20 MG IM SOLR
20.0000 mg | Freq: Once | INTRAMUSCULAR | Status: AC
  Administered 2019-03-02: 14:00:00 20 mg via INTRAMUSCULAR
  Filled 2019-03-02: qty 20
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
## 2019-03-02 NOTE — ED Notes (Signed)
Hourly rounding reveals patient sleeping in room. No complaints, stable, in no acute distress. Q15 minute rounds and monitoring via Security Cameras to continue. 
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
## 2019-03-02 NOTE — ED Notes (Signed)
PT PLACED  UNDER  IVC  PAPERS  INFORMED  RN JADEKA  AND  ODS  OFFICER

## 2019-03-02 NOTE — ED Notes (Signed)
Pt moved from BHU to ED 22. Pt oriented to area and is cooperative at this time.

## 2019-03-02 NOTE — ED Notes (Signed)
Hourly rounding reveals patient in room. No complaints, stable, in no acute distress. Q15 minute rounds and monitoring via Security Cameras to continue. 

## 2019-03-02 NOTE — ED Notes (Signed)
Hourly rounding reveals patient in day room. Complaining about not being able to knock on the nurses station door constantly requesting all manner of things. Pt. stable, in no acute distress. Q15 minute rounds and monitoring via [employer] to continue.

## 2019-03-02 NOTE — ED Notes (Signed)
Hourly rounding reveals patient in room. No complaints, stable, in no acute distress. Q15 minute rounds and monitoring via [employer] and Officer to continue.   

## 2019-03-02 NOTE — Consult Note (Signed)
[facility] Psych ED Progress Note  03/02/2019 5:10 PM Oscar Duke  MRN:  [id number] Subjective: Patient is a 48 year old male with a past psychiatric history significant for schizophrenia versus schizoaffective disorder versus bipolar disorder with psychotic features.  He also carries a diagnosis of intellectual deficit disorder.  He was brought to the emergency department on 4/15 secondary to agitation, bad behaviors, and paranoia.  Objective: Patient is seen and examined.  Patient's behavior continues to be labile.  Today he has been verbally assaultive and threatening to staff.  He is paranoid of people in the emergency department, specifically security guards.  He makes sexually inappropriate comments to women.  Patient is perseverative on ensuring that he gets his flat screen TV and his $50.  He continuously requests food, and for people to contact his payee so that he can get his money.  Patient requires Geodon injection today for aggressive behaviors.  Afterwards, patient is apologetic, however requires significant redirection for not observing personal boundaries.  Patient remains easily agitated when does not get his way or requested redirection. Of note, patient's medications were changed to Zyprexa in hopes for improved behavior control after collateral had been received from group home.  Patient is currently denying any auditory or visual hallucinations.  He denies SI or HI, and states "I hope I do not have to go to [facility] or [place].  He continues on clonazepam, Cogentin, trazodone and Zyprexa. Depakote, Remeron, Seroquel, and Prolixin have been discontinued.  Principal Problem: Aggression Diagnosis:  Principal Problem:   Aggression  Total Time spent with patient: 35 min  Past Psychiatric History: Schizoaffective disorder bipolar type  Past Medical History:  Past Medical History:  Diagnosis Date  . Bipolar 1 disorder (HCC)   . Diabetes mellitus without complication (HCC)   .  Hyperlipidemia   . Hypertension   . 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 Schizophrenia, acute (HCC)   . Seizures (HCC)     Past Surgical History:  Procedure Laterality Date  . JOINT REPLACEMENT     Family History: No family history on file. Family Psychiatric  History: See admission consultation Social History:  Social History   Substance and Sexual Activity  Alcohol Use No     Social History   Substance and Sexual Activity  Drug Use Yes  . Types: Marijuana   Comment: quit 5 years ago    Social History   Socioeconomic History  . Marital status: Single    Spouse name: Not on file  . Number of children: Not on file  . Years of education: Not on file  . Highest education level: Not on file  Occupational History  . Not on file  Social Needs  . Financial resource strain: Not on file  . Food insecurity:    Worry: Not on file    Inability: Not on file  . Transportation needs:    Medical: Not on file    Non-medical: Not on file  Tobacco Use  . Smoking status: Never Smoker  . Smokeless tobacco: Never Used  Substance and Sexual Activity  . Alcohol use: No  . Drug use: Yes    Types: Marijuana    Comment: quit 5 years ago  . Sexual activity: Not on file  Lifestyle  . Physical activity:    Days per week: Not on file    Minutes per session: Not on file  . Stress: Not on file  Relationships  . Social connections:  Talks on phone: Not on file    Gets together: Not on file    Attends religious service: Not on file    Active member of club or organization: Not on file    Attends meetings of clubs or organizations: Not on file    Relationship status: Not on file  Other Topics Concern  . Not on file  Social History Narrative  . Not on file    Sleep: Good  Appetite:  Good  Current Medications: Current Facility-Administered Medications  Medication Dose Route Frequency Provider Last Rate Last Dose  . benztropine (COGENTIN) tablet 2 mg  2  mg Oral BID WC Mariel Craft, MD      . cholecalciferol (VITAMIN D3) tablet 2,000 Units  2,000 Units Oral Daily Antonieta Pert, MD   2,000 Units at 03/02/19 1037  . clonazePAM (KLONOPIN) tablet 2 mg  2 mg Oral BID Sharyn Creamer, MD   2 mg at 03/02/19 1009  . divalproex (DEPAKOTE ER) 24 hr tablet 2,000 mg  2,000 mg Oral QHS Mariel Craft, MD       Followed by  . [START ON 03/03/2019] divalproex (DEPAKOTE ER) 24 hr tablet 1,500 mg  1,500 mg Oral QHS Mariel Craft, MD      . insulin aspart (novoLOG) injection 0-20 Units  0-20 Units Subcutaneous TID WC Nita Sickle, MD   3 Units at 03/01/19 1630  . insulin aspart (novoLOG) injection 0-5 Units  0-5 Units Subcutaneous QHS Don Perking, [place], MD      . insulin detemir (LEVEMIR) injection 28 Units  28 Units Subcutaneous QHS Antonieta Pert, MD   28 Units at 03/01/19 2147  . lactulose (CHRONULAC) 10 GM/15ML solution 10 g  10 g Oral BID Antonieta Pert, MD   10 g at 03/02/19 1009  . pantoprazole (PROTONIX) EC tablet 40 mg  40 mg Oral Daily Antonieta Pert, MD   40 mg at 03/02/19 1038  . sodium chloride tablet 1 g  1 g Oral Daily Antonieta Pert, MD   1 g at 03/02/19 1010  . ziprasidone (GEODON) capsule 40 mg  40 mg Oral BID WC Mariel Craft, MD       Current Outpatient Medications  Medication Sig Dispense Refill  . benztropine (COGENTIN) 2 MG tablet Take 1 tablet (2 mg total) by mouth 2 (two) times daily. 60 tablet 0  . Cholecalciferol (VITAMIN D3) 50 MCG (2000 UT) capsule Take 2,000 Units by mouth daily.    . clonazePAM (KLONOPIN) 2 MG tablet Take 1 tablet (2 mg total) by mouth 2 (two) times daily. 60 tablet 0  . divalproex (DEPAKOTE) 250 MG DR tablet Take 3 tablets (750 mg total) by mouth every 12 (twelve) hours. 180 tablet 0  . fluPHENAZine (PROLIXIN) 10 MG tablet Take 10 mg by mouth 3 (three) times daily.    . insulin detemir (LEVEMIR) 100 UNIT/ML injection Inject 0.28 mLs (28 Units total) into the skin at bedtime. 10 mL  11  . lactulose (CHRONULAC) 10 GM/15ML solution Take 15 mLs (10 g total) by mouth 2 (two) times daily. 600 mL 0  . levETIRAcetam (KEPPRA) 750 MG tablet Take 1,500 mg by mouth daily.    Marland Kitchen LORazepam (ATIVAN) 2 MG tablet Take 1 tablet (2 mg total) by mouth every 6 (six) hours as needed for anxiety (agitation). 31 tablet 0  . mirtazapine (REMERON SOL-TAB) 15 MG disintegrating tablet Take 15 mg by mouth at bedtime.    Marland Kitchen  OLANZapine (ZYPREXA) 5 MG tablet Take 5 mg by mouth every 8 (eight) hours as needed for agitation.    Marland Kitchen olanzapine zydis (ZYPREXA) 15 MG disintegrating tablet Take 15 mg by mouth at bedtime.    . pantoprazole (PROTONIX) 40 MG tablet Take 1 tablet (40 mg total) by mouth daily. 30 tablet 0  . perphenazine (TRILAFON) 8 MG tablet Take 8 mg by mouth 3 (three) times daily.    . QUEtiapine (SEROQUEL) 100 MG tablet Take 100 mg by mouth every 4 (four) hours as needed (anxiety).    . sodium chloride 1 g tablet Take 1 tablet (1 g total) by mouth daily. 30 tablet 0  . traZODone (DESYREL) 50 MG tablet Take 1 tablet (50 mg total) by mouth at bedtime. 30 tablet 0    Lab Results:  Results for orders placed or performed during the hospital encounter of 02/28/19 (from the past 48 hour(s))  CBC with Differential/Platelet     Status: Abnormal   Collection Time: 02/28/19 10:21 PM  Result Value Ref Range   WBC 7.8 4.0 - 10.5 K/uL   RBC 3.57 (L) 4.22 - 5.81 MIL/uL   Hemoglobin 10.9 (L) 13.0 - 17.0 g/dL   HCT 08.6 (L) 57.8 - 46.9 %   MCV 90.8 80.0 - 100.0 fL   MCH 30.5 26.0 - 34.0 pg   MCHC 33.6 30.0 - 36.0 g/dL   RDW 62.9 52.8 - 41.3 %   Platelets 198 150 - 400 K/uL   nRBC 0.0 0.0 - 0.2 %   Neutrophils Relative % 44 %   Neutro Abs 3.5 1.7 - 7.7 K/uL   Lymphocytes Relative 43 %   Lymphs Abs 3.4 0.7 - 4.0 K/uL   Monocytes Relative 9 %   Monocytes Absolute 0.7 0.1 - 1.0 K/uL   Eosinophils Relative 2 %   Eosinophils Absolute 0.2 0.0 - 0.5 K/uL   Basophils Relative 1 %   Basophils Absolute 0.1  0.0 - 0.1 K/uL   Immature Granulocytes 1 %   Abs Immature Granulocytes 0.04 0.00 - 0.07 K/uL    Comment: Performed at [facility], [address] [place], [place] [address]  Comprehensive metabolic panel     Status: Abnormal   Collection Time: 02/28/19 10:21 PM  Result Value Ref Range   Sodium 130 (L) 135 - 145 mmol/L   Potassium 3.3 (L) 3.5 - 5.1 mmol/L   Chloride 96 (L) 98 - 111 mmol/L   CO2 24 22 - 32 mmol/L   Glucose, Bld 88 70 - 99 mg/dL   BUN 9 6 - 20 mg/dL   Creatinine, Ser 0.27 0.61 - 1.24 mg/dL   Calcium 8.7 (L) 8.9 - 10.3 mg/dL   Total Protein 6.7 6.5 - 8.1 g/dL   Albumin 3.7 3.5 - 5.0 g/dL   AST 17 15 - 41 U/L   ALT 11 0 - 44 U/L   Alkaline Phosphatase 49 38 - 126 U/L   Total Bilirubin 0.5 0.3 - 1.2 mg/dL   GFR calc non Af Amer >60 >60 mL/min   GFR calc Af Amer >60 >60 mL/min   Anion gap 10 5 - 15    Comment: Performed at [facility], [address] [place], [place] [address]  Ethanol     Status: None   Collection Time: 02/28/19 10:21 PM  Result Value Ref Range   Alcohol, Ethyl (B) <10 <10 mg/dL    Comment: (NOTE) Lowest detectable limit for serum alcohol is 10 mg/dL.  For medical purposes only. Performed at [facility], [address] [place], [place] [address]   Acetaminophen level     Status: Abnormal   Collection Time: 02/28/19 10:21 PM  Result Value Ref Range   Acetaminophen (Tylenol), Serum <10 (L) 10 - 30 ug/mL    Comment: (NOTE) Therapeutic concentrations vary significantly. A range of 10-30 ug/mL  may be an effective concentration for many patients. However, some  are best treated at concentrations outside of this range. Acetaminophen concentrations >150 ug/mL at 4 hours after ingestion  and >50 ug/mL at 12 hours after ingestion are often associated with  toxic reactions. Performed at [facility], [address] [place], [place] [address]   Salicylate level     Status: None   Collection Time: 02/28/19  10:21 PM  Result Value Ref Range   Salicylate Lvl <7.0 2.8 - 30.0 mg/dL    Comment: Performed at [facility], [address] [place], [place] [address]  Urine Drug Screen, Qualitative ([facility] only)     Status: Abnormal   Collection Time: 02/28/19 10:22 PM  Result Value Ref Range   Tricyclic, Ur Screen POSITIVE (A) NONE DETECTED   Amphetamines, Ur Screen NONE DETECTED NONE DETECTED   MDMA (Ecstasy)Ur Screen NONE DETECTED NONE DETECTED   Cocaine Metabolite,Ur [place] NONE DETECTED NONE DETECTED   Opiate, Ur Screen NONE DETECTED NONE DETECTED   Phencyclidine (PCP) Ur S NONE DETECTED NONE DETECTED   Cannabinoid 50 Ng, Ur  NONE DETECTED NONE DETECTED   Barbiturates, Ur Screen NONE DETECTED NONE DETECTED   Benzodiazepine, Ur Scrn POSITIVE (A) NONE DETECTED   Methadone Scn, Ur NONE DETECTED NONE DETECTED    Comment: (NOTE) Tricyclics + metabolites, urine    Cutoff 1000 ng/mL Amphetamines + metabolites, urine  Cutoff 1000 ng/mL MDMA (Ecstasy), urine              Cutoff 500 ng/mL Cocaine Metabolite, urine          Cutoff 300 ng/mL Opiate + metabolites, urine        Cutoff 300 ng/mL Phencyclidine (PCP), urine         Cutoff 25 ng/mL Cannabinoid, urine                 Cutoff 50 ng/mL Barbiturates + metabolites, urine  Cutoff 200 ng/mL Benzodiazepine, urine              Cutoff 200 ng/mL Methadone, urine                   Cutoff 300 ng/mL The urine drug screen provides only a preliminary, unconfirmed analytical test result and should not be used for non-medical purposes. Clinical consideration and professional judgment should be applied to any positive drug screen result due to possible interfering substances. A more specific alternate chemical method must be used in order to obtain a confirmed analytical result. Gas chromatography / mass spectrometry (GC/MS) is the preferred confirmat ory method. Performed at [facility], [address] [place], [place] [address]    Urinalysis, Complete w Microscopic     Status: Abnormal   Collection Time: 02/28/19 10:22 PM  Result Value Ref Range   Color, Urine STRAW (A) YELLOW   APPearance CLEAR (A) CLEAR   Specific Gravity, Urine 1.004 (L) 1.005 - 1.030   pH 6.0 5.0 - 8.0   Glucose, UA NEGATIVE NEGATIVE mg/dL   Hgb urine dipstick NEGATIVE NEGATIVE   Bilirubin Urine NEGATIVE NEGATIVE   Ketones,  ur NEGATIVE NEGATIVE mg/dL   Protein, ur NEGATIVE NEGATIVE mg/dL   Nitrite NEGATIVE NEGATIVE   Leukocytes,Ua NEGATIVE NEGATIVE   RBC / HPF 0-5 0 - 5 RBC/hpf   WBC, UA 0-5 0 - 5 WBC/hpf   Bacteria, UA NONE SEEN NONE SEEN   Squamous Epithelial / LPF NONE SEEN 0 - 5    Comment: Performed at [facility], [address] [place], [place] [address]  Glucose, capillary     Status: Abnormal   Collection Time: 03/01/19 12:27 PM  Result Value Ref Range   Glucose-Capillary 106 (H) 70 - 99 mg/dL  Glucose, capillary     Status: Abnormal   Collection Time: 03/01/19  4:26 PM  Result Value Ref Range   Glucose-Capillary 141 (H) 70 - 99 mg/dL  Glucose, capillary     Status: Abnormal   Collection Time: 03/01/19  9:43 PM  Result Value Ref Range   Glucose-Capillary 138 (H) 70 - 99 mg/dL  Glucose, capillary     Status: Abnormal   Collection Time: 03/02/19  8:02 AM  Result Value Ref Range   Glucose-Capillary 109 (H) 70 - 99 mg/dL  Glucose, capillary     Status: Abnormal   Collection Time: 03/02/19  4:39 PM  Result Value Ref Range   Glucose-Capillary 122 (H) 70 - 99 mg/dL    Blood Alcohol level:  Lab Results  Component Value Date   ETH <10 02/28/2019   ETH <10 02/24/2019    Physical Findings: AIMS:  , ,  ,  ,    CIWA:    COWS:     Musculoskeletal: Strength & Muscle Tone: within normal limits Gait & Station: normal Patient leans: N/A  Psychiatric Specialty Exam: Physical Exam  Nursing note and vitals reviewed. Constitutional: He appears well-developed and well-nourished.  HENT:  Head: Normocephalic and  atraumatic.  Respiratory: Effort normal. No respiratory distress.  Musculoskeletal: Normal range of motion.  Neurological: He is alert.  Psychiatric: His mood appears anxious. His affect is labile and inappropriate. His speech is tangential. He is agitated, aggressive and combative. Thought content is paranoid. Cognition and memory are impaired. He expresses impulsivity and inappropriate judgment. He expresses homicidal ideation. He expresses no suicidal ideation. He expresses no homicidal plans. He is inattentive.    Review of Systems  Psychiatric/Behavioral: Negative for depression, hallucinations, substance abuse and suicidal ideas. The patient is nervous/anxious. The patient does not have insomnia.   All other systems reviewed and are negative.   Blood pressure (!) 138/91, pulse 98, temperature 98.6 F (37 C), temperature source Oral, resp. rate 16, SpO2 99 %.There is no height or weight on file to calculate BMI.  General Appearance: Casual  Eye Contact:  Good  Speech:  Pressured  Volume:  Increased  Mood:  Anxious, Dysphoric and Irritable  Affect:  Labile  Thought Process:  Goal Directed and Descriptions of Associations: Circumstantial  Orientation:  Full (Time, Place, and Person)  Thought Content:  Delusions and Rumination  Suicidal Thoughts:  No  Homicidal Thoughts:  Yes.  without intent/plan  Memory:  Immediate;   Fair Recent;   Fair Remote;   Fair  Judgement:  Impaired  Insight:  Lacking  Psychomotor Activity:  Increased  Concentration:  Concentration: Fair and Attention Span: Fair  Recall:  Poor  Fund of Knowledge:  Poor  Language:  Fair  Akathisia:  Negative  Handed:  Right  AIMS (if indicated):     Assets:  Desire for Improvement Resilience  ADL's:  Intact  Cognition:  Impaired,  Mild  Sleep:   Adequate      Treatment Plan Summary: Daily contact with patient to assess and evaluate symptoms and progress in treatment, Medication management and Plan : Patient is  seen and examined.  Patient is a 48 year old male with the above-stated past psychiatric history who is seen in follow-up in the emergency department.  He remains significantly labile.  His Depakote level on admission was 65.  Increase Depakote ER to loading dose 2000 mg at bedtime on 03/02/2019 followed by 1500 mg nightly.  For mood stabilization Repeat VPA level on 03/06/2019 Continue Klonopin 2 mg twice daily to decrease agitation Continue Cogentin 2 mg twice daily to prevent EPS Start Geodon 40 mg twice daily with breakfast and dinner after good response to IM Geodon today for mood stabilization and psychosis prevention. Discontinue Zyprexa at this time, which has not been effective at 10 mg at bedtime dose.  [facility] due to ongoing aggressive behavior uncontrolled by medications in the emergency department.  Mariel Craft, MD 03/02/2019, 5:10 PM

## 2019-03-02 NOTE — ED Provider Notes (Signed)
I have had to involuntarily commit the patient again as he has become aggressive and threatening to staff.  Attempted p.o. Zyprexa with little improvement, will give IM Geodon for patient and staff safety.   Jene Every, MD 03/02/19 1359

## 2019-03-02 NOTE — ED Notes (Signed)
Report to include Situation, Background, Assessment, and Recommendations received from [facility]. Patient alert, warm and dry, in no acute distress. Patient irritable refuses to answer questions related to SI, HI, AVH and pain. Patient made aware of Q15 minute rounds and Psychologist, counselling presence for their safety. Patient instructed to come to me with needs or concerns.

## 2019-03-02 NOTE — ED Notes (Signed)
Pt given meal tray and cup of water.

## 2019-03-02 NOTE — Progress Notes (Signed)
4:33pm- CSW contacted Oscar Duke on pt's ACT team to verify that fax was received, and assess if any additional information was needed. Voicemail left.    Murvin Donning  [facility] ED  [phone]

## 2019-03-02 NOTE — ED Notes (Addendum)
Patient has been verbally aggressive towards staff, he called staff member "niggers" when told it wasn't phone time. Patient has been yelling and screaming at the top of his lungs, his behavior is very labile. Patient begins yelling when he cant get his way and saying he wants to kill himself. Patient walked up to officer and told him he would "whoop his ass".    IM Geodon ordered

## 2019-03-02 NOTE — Progress Notes (Signed)
3:10pm - CSW was present as TTS contacted Benita Stabile with [facility] [phone]) to assess current progress regarding group home placement for this pt. Tasia Catchings stated that he assumed since pt was on psychiatric hold that he would be admitted to an inpatient psychiatric facility.  Tasia Catchings was informed that pt still needed placement for a group home facility. Tasia Catchings agreed to begin looking for placement, and asked to receive FL2 note.   CSW faxed FL2 to Benita Stabile at [phone]. Will follow-up to see if fax was received.   Murvin Donning  [facility] ED  [phone]

## 2019-03-02 NOTE — ED Notes (Signed)
Hourly rounding reveals patient in room still complaining about everything. Stable, in no acute distress. Q15 minute rounds and monitoring via Psychologist, counselling to continue.

## 2019-03-02 NOTE — ED Notes (Signed)
IVC/Consult Completed/Pending Placement 

## 2019-03-02 NOTE — ED Notes (Signed)
Patient woke up from his nap after his Geodon injection. Patient woke up talking, and being disrespectful towards staff. Patient asked for a new sheet for his bed, writer gave patient a new sheet and he asked staff to put it on for him, encouraged patient to place the sheet on his bed by himself. Patient became upset

## 2019-03-02 NOTE — ED Notes (Signed)
Hourly rounding reveals patient in rest room. No complaints, stable, in no acute distress. Q15 minute rounds and monitoring via Security Cameras to continue. 

## 2019-03-02 NOTE — ED Notes (Signed)
Patient continues to yell out of his room, at staff and continues to not stay in his room. Patient continues to need redirection.   Patient talking to Dr. Cyril Loosen

## 2019-03-02 NOTE — ED Provider Notes (Signed)
Reassessed the patient, he is lying in bed and is relatively calm.  He has apparently been calling out frequently to the nurse.  He has agreed to take p.o. Ativan notes that he is hungry, we will get breakfast tray.  We will continue to closely monitor.   Jene Every, MD 03/02/19 [phone]

## 2019-03-02 NOTE — ED Notes (Signed)
Patient continues to not follow rules, he continues to yell out and scream at staff. Patient continues to tell staff he doesn't have to follow directions and yells out "i'm bad to the bone". Patient continues to keep coming out of his room, staff has to continue to redirect him to stay in his room, and he yells out "I dont have to do a damn thing, yall acting like bitches" MD Kinner notified Ordered Ativan 2mg  PO

## 2019-03-02 NOTE — ED Notes (Signed)
Ptasking for cookie. When pt was informed we do not have cookies pt slammed door and began yelling "Fuck you, fuck you."

## 2019-03-02 NOTE — ED Notes (Addendum)
Pt. Transferred to [facility] from ED to room after screening for contraband. Report to include Situation, Background, Assessment and Recommendations from Gary RN. Pt. Oriented to unit including Q15 minute rounds as well as the security cameras for their protection. Patient is alert and oriented, warm and dry in no acute distress. Patient denies SI, HI, and AVH. Pt. Encouraged to let me know if needs arise.   

## 2019-03-02 NOTE — ED Provider Notes (Signed)
-----------------------------------------   5:54 AM on 03/02/2019 -----------------------------------------   Blood pressure (!) 138/91, pulse 98, temperature 98.6 F (37 C), temperature source Oral, resp. rate 16, SpO2 99 %.  The patient is calm and cooperative at this time.  There have been no acute events since the last update.  Awaiting disposition plan from social work team.   Irean Hong, MD 03/02/19 [phone]

## 2019-03-03 DIAGNOSIS — R4182 Altered mental status, unspecified: Secondary | ICD-10-CM | POA: Diagnosis not present

## 2019-03-03 DIAGNOSIS — R4689 Other symptoms and signs involving appearance and behavior: Secondary | ICD-10-CM | POA: Diagnosis not present

## 2019-03-03 DIAGNOSIS — F250 Schizoaffective disorder, bipolar type: Secondary | ICD-10-CM | POA: Diagnosis not present

## 2019-03-03 LAB — GLUCOSE, CAPILLARY
Glucose-Capillary: 84 mg/dL (ref 70–99)
Glucose-Capillary: 80 mg/dL (ref 70–99)
Glucose-Capillary: 153 mg/dL — ABNORMAL HIGH (ref 70–99)
Glucose-Capillary: 154 mg/dL — ABNORMAL HIGH (ref 70–99)

## 2019-03-03 NOTE — BH Assessment (Signed)
Late Entry: Psych MD (Dr. Viviano Simas) and writer spoke with patient's natural support/payee Onalee Hua [phone]). He provided additional information about the patient's history.  Spoke with Group Home [phone]), the owner wasn't available. Per the Group Home staff, they have discharged the patient and someone else has filled the bed.  Psych MD (Dr. Viviano Simas) and writer contacted natural support/payee again and informed him what the Group Home shared. Advised him to get patient's belongings and seek a refund for the money paid for the month. He stated he was going to fellow up with the Group Home about it.

## 2019-03-03 NOTE — ED Notes (Signed)
Patient reported a fall in his room and hurt his knee. He said he missed the bed and went on the floor. Per officer patient was seen laying on the floor while watching on the camera. Charge nurse and MD notified.

## 2019-03-03 NOTE — Consult Note (Signed)
[facility]  03/03/2019 5:31 PM Gari CrownMark A Gibbon  MRN:  [id number] Subjective: Patient is a 48 year old male with a past psychiatric history significant for schizophrenia versus schizoaffective disorder versus bipolar disorder with psychotic features.  He also carries a diagnosis of intellectual deficit disorder.  He was brought to the emergency department on 4/15 secondary to agitation, bad behaviors, and paranoia.  Objective: Patient is seen and examined.  Patient's behavior is significantly improved.  He has been more appropriate with staff and is redirectable when he becomes mildly agitated.  Patient is tolerating medication changes and denies side effects today, however continues to be perseverative about scars from old rash on his left lower extremity that he relates to Geodon.  Patient slept well overnight.  He continues to have an increased appetite.  He is denying SI, HI, AVH.  Collateral from group home: Ms. Lucendia HerrlichFaye states that patient is not welcome to come back to group home.  She states that his bed has been assigned to another resident.  Collateral obtained from Nelle DonMichael Herring, patient's POA: Mr. [name] expresses concern for housing for patient after discharge.  Mr. [name] states that patient does not carry a diagnosis of mental retardation or IDD.  He states that patient is very intelligent and has been in the mental health system since he was 48 years old, so knows how to manipulate behaviors in order to get what he wants.  Mr. Lollie SailsHarry notes that when patient is on medication and stabilized he does very well, however he does not tolerate medication adjustments or missed medication doses well and can quickly decompensate.  He expresses concern that recently when patient has decompensations he has become physically violent which is a newer behavior.  Principal Problem: Aggression Diagnosis:  Principal Problem:   Aggression  Patient is seen, chart is reviewed.  Collateral  [place]'s group home, and from patient's POA, Nelle DonMichael Herring [phone]) Total Time spent with patient: 45 minutes  Past Psychiatric History: Schizoaffective disorder bipolar type  Past Medical History:  Past Medical History:  Diagnosis Date  . Bipolar 1 disorder (HCC)   . Diabetes mellitus without complication (HCC)   . Hyperlipidemia   . Hypertension   . 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 Schizophrenia, acute (HCC)   . Seizures (HCC)     Past Surgical History:  Procedure Laterality Date  . JOINT REPLACEMENT     Family History: No family history on file. Family Psychiatric  History: None noted Social History:  Social History   Substance and Sexual Activity  Alcohol Use No     Social History   Substance and Sexual Activity  Drug Use Yes  . Types: Marijuana   Comment: quit 5 years ago    Social History   Socioeconomic History  . Marital status: Single    Spouse name: Not on file  . Number of children: Not on file  . Years of education: Not on file  . Highest education level: Not on file  Occupational History  . Not on file  Social Needs  . Financial resource strain: Not on file  . Food insecurity:    Worry: Not on file    Inability: Not on file  . Transportation needs:    Medical: Not on file    Non-medical: Not on file  Tobacco Use  . Smoking status: Never Smoker  . Smokeless tobacco: Never Used  Substance and Sexual Activity  . Alcohol  use: No  . Drug use: Yes    Types: Marijuana    Comment: quit 5 years ago  . Sexual activity: Not on file  Lifestyle  . Physical activity:    Days per week: Not on file    Minutes per session: Not on file  . Stress: Not on file  Relationships  . Social connections:    Talks on phone: Not on file    Gets together: Not on file    Attends religious service: Not on file    Active member of club or organization: Not on file    Attends meetings of clubs or  organizations: Not on file    Relationship status: Not on file  Other Topics Concern  . Not on file  Social History Narrative  . Not on file    Sleep: Good  Appetite:  Good  Current Medications: Current Facility-Administered Medications  Medication Dose Route Frequency Provider Last Rate Last Dose  . benztropine (COGENTIN) tablet 2 mg  2 mg Oral BID WC Mariel Craft, MD   2 mg at 03/03/19 1655  . cholecalciferol (VITAMIN D3) tablet 2,000 Units  2,000 Units Oral Daily Antonieta Pert, MD   2,000 Units at 03/03/19 1035  . clonazePAM (KLONOPIN) tablet 2 mg  2 mg Oral BID Sharyn Creamer, MD   2 mg at 03/03/19 1035  . divalproex (DEPAKOTE ER) 24 hr tablet 1,500 mg  1,500 mg Oral QHS Mariel Craft, MD      . insulin aspart (novoLOG) injection 0-20 Units  0-20 Units Subcutaneous TID WC Nita Sickle, MD   4 Units at 03/03/19 1656  . insulin aspart (novoLOG) injection 0-5 Units  0-5 Units Subcutaneous QHS Don Perking, [place], MD      . insulin detemir (LEVEMIR) injection 28 Units  28 Units Subcutaneous QHS Antonieta Pert, MD   28 Units at 03/02/19 2151  . lactulose (CHRONULAC) 10 GM/15ML solution 10 g  10 g Oral BID Antonieta Pert, MD   10 g at 03/03/19 0930  . pantoprazole (PROTONIX) EC tablet 40 mg  40 mg Oral Daily Antonieta Pert, MD   40 mg at 03/03/19 1035  . sodium chloride tablet 1 g  1 g Oral Daily Antonieta Pert, MD   1 g at 03/03/19 0900  . ziprasidone (GEODON) capsule 40 mg  40 mg Oral BID WC Mariel Craft, MD   40 mg at 03/03/19 1656   Current Outpatient Medications  Medication Sig Dispense Refill  . benztropine (COGENTIN) 2 MG tablet Take 1 tablet (2 mg total) by mouth 2 (two) times daily. 60 tablet 0  . Cholecalciferol (VITAMIN D3) 50 MCG (2000 UT) capsule Take 2,000 Units by mouth daily.    . clonazePAM (KLONOPIN) 2 MG tablet Take 1 tablet (2 mg total) by mouth 2 (two) times daily. 60 tablet 0  . divalproex (DEPAKOTE) 250 MG DR tablet Take 3 tablets  (750 mg total) by mouth every 12 (twelve) hours. 180 tablet 0  . fluPHENAZine (PROLIXIN) 10 MG tablet Take 10 mg by mouth 3 (three) times daily.    . insulin detemir (LEVEMIR) 100 UNIT/ML injection Inject 0.28 mLs (28 Units total) into the skin at bedtime. 10 mL 11  . lactulose (CHRONULAC) 10 GM/15ML solution Take 15 mLs (10 g total) by mouth 2 (two) times daily. 600 mL 0  . levETIRAcetam (KEPPRA) 750 MG tablet Take 1,500 mg by mouth daily.    Marland Kitchen LORazepam (ATIVAN) 2  MG tablet Take 1 tablet (2 mg total) by mouth every 6 (six) hours as needed for anxiety (agitation). 31 tablet 0  . mirtazapine (REMERON SOL-TAB) 15 MG disintegrating tablet Take 15 mg by mouth at bedtime.    Marland Kitchen OLANZapine (ZYPREXA) 5 MG tablet Take 5 mg by mouth every 8 (eight) hours as needed for agitation.    Marland Kitchen olanzapine zydis (ZYPREXA) 15 MG disintegrating tablet Take 15 mg by mouth at bedtime.    . pantoprazole (PROTONIX) 40 MG tablet Take 1 tablet (40 mg total) by mouth daily. 30 tablet 0  . perphenazine (TRILAFON) 8 MG tablet Take 8 mg by mouth 3 (three) times daily.    . QUEtiapine (SEROQUEL) 100 MG tablet Take 100 mg by mouth every 4 (four) hours as needed (anxiety).    . sodium chloride 1 g tablet Take 1 tablet (1 g total) by mouth daily. 30 tablet 0  . traZODone (DESYREL) 50 MG tablet Take 1 tablet (50 mg total) by mouth at bedtime. 30 tablet 0    Lab Results:  Results for orders placed or performed during the hospital encounter of 02/28/19 (from the past 48 hour(s))  Glucose, capillary     Status: Abnormal   Collection Time: 03/01/19  9:43 PM  Result Value Ref Range   Glucose-Capillary 138 (H) 70 - 99 mg/dL  Glucose, capillary     Status: Abnormal   Collection Time: 03/02/19  8:02 AM  Result Value Ref Range   Glucose-Capillary 109 (H) 70 - 99 mg/dL  Glucose, capillary     Status: Abnormal   Collection Time: 03/02/19  4:39 PM  Result Value Ref Range   Glucose-Capillary 122 (H) 70 - 99 mg/dL  Glucose, capillary      Status: Abnormal   Collection Time: 03/02/19  9:39 PM  Result Value Ref Range   Glucose-Capillary 183 (H) 70 - 99 mg/dL  Glucose, capillary     Status: None   Collection Time: 03/03/19  8:19 AM  Result Value Ref Range   Glucose-Capillary 84 70 - 99 mg/dL   Comment 1 Notify RN   Glucose, capillary     Status: None   Collection Time: 03/03/19 11:58 AM  Result Value Ref Range   Glucose-Capillary 80 70 - 99 mg/dL  Glucose, capillary     Status: Abnormal   Collection Time: 03/03/19  4:44 PM  Result Value Ref Range   Glucose-Capillary 153 (H) 70 - 99 mg/dL    Blood Alcohol level:  Lab Results  Component Value Date   ETH <10 02/28/2019   ETH <10 02/24/2019    Physical Findings: AIMS:  , ,  ,  ,    CIWA:    COWS:     Musculoskeletal: Strength & Muscle Tone: within normal limits Gait & Station: normal Patient leans: N/A  Psychiatric Specialty Exam: Physical Exam  Nursing note and vitals reviewed. Constitutional: He appears well-developed and well-nourished.  HENT:  Head: Normocephalic and atraumatic.  Respiratory: Effort normal. No respiratory distress.  Musculoskeletal: Normal range of motion.  Neurological: He is alert.  Psychiatric: His mood appears not anxious. His affect is blunt. His affect is not labile and not inappropriate. His speech is tangential. He is not agitated, not aggressive and not combative. Thought content is paranoid. Cognition and memory are normal. He does not express impulsivity or inappropriate judgment. He expresses no homicidal and no suicidal ideation. He expresses no homicidal plans. He is attentive.    Review of Systems  Psychiatric/Behavioral: Negative for depression, hallucinations, substance abuse and suicidal ideas. The patient is not nervous/anxious and does not have insomnia.   All other systems reviewed and are negative.   Blood pressure (!) 172/101, pulse 95, temperature 97.9 F (36.6 C), temperature source Oral, resp. rate 18, SpO2  100 %.There is no height or weight on file to calculate BMI.  General Appearance: Casual  Eye Contact:  Good  Speech:  Clear and Coherent and Normal Rate  Volume:  Normal  Mood:  Dysphoric  Affect:  Congruent, blunted  Thought Process:  Goal Directed and Descriptions of Associations: Circumstantial  Orientation:  Full (Time, Place, and Person)  Thought Content:  Hallucinations: None and Rumination  Suicidal Thoughts:  No  Homicidal Thoughts:  No  Memory:  Immediate;   Fair Recent;   Fair Remote;   Fair  Judgement:  Poor  Insight:  Shallow  Psychomotor Activity:  Normal  Concentration:  Concentration: Fair and Attention Span: Fair  Recall:  [employer] of Knowledge:  Fair  Language:  Good  Akathisia:  Negative  Handed:  Right  AIMS (if indicated):     Assets:  Desire for Improvement Resilience  ADL's:  Intact  Cognition:  Impaired,  Mild  Sleep:   Adequate      Treatment Plan Summary: Daily contact with patient to assess and evaluate symptoms and progress in treatment, Medication management and Plan : Patient is seen and examined.  Patient is a 48 year old male with the above-stated past psychiatric history who is seen in follow-up in the emergency department.   His Depakote level on admission was 65.  Continue Depakote ER 1500 mg nightly for mood stabilization Repeat VPA level on 03/06/2019 Continue Klonopin 2 mg twice daily to decrease agitation Continue Cogentin 2 mg twice daily to prevent EPS Continue Geodon 40 mg twice daily with breakfast and dinner for mood stabilization and psychosis prevention.  Will monitor skin assessment for development of rash.  [facility] should patient develop ongoing aggressive behavior uncontrolled by medications in the emergency department.  Mariel Craft, MD 03/03/2019, 5:31 PM

## 2019-03-03 NOTE — ED Notes (Signed)
Hourly rounding reveals patient in room. No complaints, stable, in no acute distress. Q15 minute rounds and monitoring via Security Cameras to continue. 

## 2019-03-03 NOTE — ED Notes (Signed)
Report to include Situation, Background, Assessment, and Recommendations received from Jadeka RN. Patient alert and oriented, warm and dry, in no acute distress. Patient denies SI, HI, AVH and pain. Patient made aware of Q15 minute rounds and security cameras for their safety. Patient instructed to come to me with needs or concerns. 

## 2019-03-03 NOTE — ED Notes (Signed)
Patient has been appropriate and cooperative today. NAD noted. Patient continues to ask for snacks and extra food.

## 2019-03-03 NOTE — ED Notes (Signed)
Snack and beverage given. 

## 2019-03-03 NOTE — ED Notes (Signed)
Hourly rounding reveals patient sleeping in room. No complaints, stable, in no acute distress. Q15 minute rounds and monitoring via Security Cameras to continue. 

## 2019-03-03 NOTE — ED Notes (Signed)
Hourly rounding reveals patient in room. No complaints, stable, in no acute distress. Q15 minute rounds and monitoring via Security Cameras to continue.Snack and beverage given. 

## 2019-03-03 NOTE — ED Notes (Signed)
IVC/  PENDING  PLACEMENT 

## 2019-03-04 DIAGNOSIS — R4689 Other symptoms and signs involving appearance and behavior: Secondary | ICD-10-CM | POA: Diagnosis not present

## 2019-03-04 DIAGNOSIS — R4182 Altered mental status, unspecified: Secondary | ICD-10-CM | POA: Diagnosis not present

## 2019-03-04 DIAGNOSIS — F250 Schizoaffective disorder, bipolar type: Secondary | ICD-10-CM | POA: Diagnosis not present

## 2019-03-04 LAB — GLUCOSE, CAPILLARY
Glucose-Capillary: 90 mg/dL (ref 70–99)
Glucose-Capillary: 92 mg/dL (ref 70–99)
Glucose-Capillary: 120 mg/dL — ABNORMAL HIGH (ref 70–99)
Glucose-Capillary: 123 mg/dL — ABNORMAL HIGH (ref 70–99)
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
## 2019-03-04 NOTE — ED Notes (Signed)
Hourly rounding reveals patient in rest room. No complaints, stable, in no acute distress. Q15 minute rounds and monitoring via Security Cameras to continue. 

## 2019-03-04 NOTE — ED Notes (Signed)
IVC/  PENDING  PLACEMENT 

## 2019-03-04 NOTE — ED Notes (Signed)
Hourly rounding reveals patient sleeping in room. No complaints, stable, in no acute distress. Q15 minute rounds and monitoring via Security Cameras to continue. 
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
## 2019-03-04 NOTE — ED Notes (Signed)
Pt. Laying in bed when this nurse entered room.  Pt. Calm and cooperative.  Pt. Asking when snack tray to be given out.  Pt. States sorry for saying racial slurs yesterday.  Pt. Appears to be in a good mood.  Pt. Shared with this nurse that "I want to be a good christian".  Pt. Requested food tray and chocolate milk for snack this evening.

## 2019-03-04 NOTE — BH Assessment (Signed)
Referral information for Psychiatric Hospitalization faxed to;    [facility]       [facility]     [facility]     [facility]     [facility]     [facility]     [facility]     [facility]     [facility]     [facility]     [facility]     [facility]     [facility]     [facility]     [facility]     [facility]     [facility]     [facility]     [place]     [facility]     [facility]

## 2019-03-04 NOTE — ED Provider Notes (Signed)
-----------------------------------------   3:44 AM on 03/04/2019 -----------------------------------------   Blood pressure (!) 172/101, pulse 95, temperature 97.9 F (36.6 C), temperature source Oral, resp. rate 18, SpO2 100 %.  The patient is calm and cooperative at this time.  There have been no acute events since the last update.  Awaiting disposition plan from Behavioral Medicine team and/or social work.   Loleta Rose, MD 03/04/19 [phone]

## 2019-03-04 NOTE — ED Notes (Signed)
Hourly rounding reveals patient in room. No complaints, stable, in no acute distress. Q15 minute rounds and monitoring via Security Cameras to continue. 

## 2019-03-04 NOTE — Progress Notes (Signed)
9:41 - CSW contacted Benita Stabile on pt's ACT team regarding progress with group home placement; Tasia Catchings stated "I've been sending out FL2s like crazy." Tasia Catchings stated that pt may be difficult to place due to behavioral issues. CSW shared with Tasia Catchings that pt has been "calm and cooperative" per most recent medical records. Tasia Catchings shared that he will continue to search for a group home.    Murvin Donning  [facility]  [phone]

## 2019-03-04 NOTE — ED Notes (Signed)
Pt. Given meal tray upon request with chocolate milk.

## 2019-03-05 DIAGNOSIS — R4689 Other symptoms and signs involving appearance and behavior: Secondary | ICD-10-CM | POA: Diagnosis not present

## 2019-03-05 DIAGNOSIS — R4182 Altered mental status, unspecified: Secondary | ICD-10-CM | POA: Diagnosis not present

## 2019-03-05 DIAGNOSIS — F250 Schizoaffective disorder, bipolar type: Secondary | ICD-10-CM | POA: Diagnosis not present

## 2019-03-05 LAB — GLUCOSE, CAPILLARY: Glucose-Capillary: 90 mg/dL (ref 70–99)

## 2019-03-05 MED ORDER — LORAZEPAM 0.5 MG PO TABS
0.5000 mg | ORAL_TABLET | Freq: Once | ORAL | Status: AC
  Administered 2019-03-05: 0.5 mg via ORAL
  Filled 2019-03-05: qty 1

## 2019-03-05 NOTE — BH Assessment (Signed)
Patient's Family/Support System [name] [place] [phone]) and notified of the patient's move.  [name] stated he has been involved with Maxximus's care since his mother passed.  He was given contact information for [facility].

## 2019-03-05 NOTE — ED Notes (Signed)
EMTALA verified by this RN.  

## 2019-03-05 NOTE — ED Provider Notes (Signed)
-----------------------------------------   7:10 AM on 03/05/2019 -----------------------------------------   Blood pressure (!) 139/98, pulse (!) 108, temperature 98.3 F (36.8 C), resp. rate 16, SpO2 99 %.  The patient is calm and cooperative at this time.  There have been no acute events since the last update.  Awaiting disposition plan from Behavioral Medicine team.    Jeanmarie Plant, MD 03/05/19 [phone]

## 2019-03-05 NOTE — Progress Notes (Signed)
CSW contacted Oscar Duke on pt's ACT team to inform him of pt's transfer to [facility]. Tasia Catchings was provided with number for [facility] to let them be aware that pt has an ACT team and to contact him when pt is ready for discharge.   TOC CSW signing off.   Murvin Donning  [facility] ED  [phone]

## 2019-03-05 NOTE — ED Notes (Signed)
Patient transferred to [facility] with [facility] Dept, patient and sheriff received transfer papers. Patient received belongings and verbalized he has received all of his belongings. Patient appropriate and cooperative, Denies SI/HI AVH. Vital signs taken. NAD noted.

## 2019-03-05 NOTE — BH Assessment (Signed)
Patient has been accepted to [facility]  [address] [place], [place] [address].  Patient assigned to room (unassinged at this time) Accepting physician is Dr. Loyola Mast.  Call report to [phone] Representative was [employer].   ER Staff is aware of it:  Cristopher Peru.; ER Secretary  Dr. Alphonzo Lemmings, ER MD  Geralynn Ochs,  Patient's Nurse     Patient's Family/Support System [facility] [phone]) have been updated as well.

## 2019-03-05 NOTE — ED Notes (Signed)
Called [facility] for transport to [facility]  [phone]

## 2019-03-05 NOTE — ED Notes (Signed)
BEHAVIORAL HEALTH ROUNDING Patient sleeping: No. Patient alert and oriented: yes Behavior appropriate: Yes.  ; If no, describe:  Nutrition and fluids offered: yes Toileting and hygiene offered: Yes  Sitter present: q15 minute observations and security monitoring Law enforcement present: Yes    

## 2019-03-05 NOTE — Consult Note (Signed)
[facility] ED Progress Note  03/05/2019 11:16 AM Oscar Duke  MRN:  [id number] Subjective: Patient is a 48 year old male with a past psychiatric history significant for schizophrenia versus schizoaffective disorder versus bipolar disorder with psychotic features.  He also carries a diagnosis of intellectual deficit disorder.  He was brought to the emergency department on 4/15 secondary to agitation, bad behaviors, and paranoia.  Objective: Patient is seen and examined.  Patient has continued to be calm and cooperative without any behavioral outbursts or agitation. He is appropriate with staff.  Patient has been speaking on the phone with family through a significant part of the day.  He is happy to learn that he is going to be moved to an inpatient psychiatry unit. Patient slept well overnight. He is denying SI, HI, AVH.  03/03/2019 Collateral from group home: Ms. Lucendia Herrlich states that patient is not welcome to come back to group home.  She states that his bed has been assigned to another resident. 03/03/2019 Collateral obtained from Nelle Don, patient's POA: Mr. Leitha Bleak expresses concern for housing for patient after discharge.  Mr. Leitha Bleak states that patient does not carry a diagnosis of mental retardation or IDD.  He states that patient is very intelligent and has been in the mental health system since he was 48 years old, so knows how to manipulate behaviors in order to get what he wants.  Mr. Lollie Sails notes that when patient is on medication and stabilized he does very well, however he does not tolerate medication adjustments or missed medication doses well and can quickly decompensate.  He expresses concern that recently when patient has decompensations he has become physically violent which is a newer behavior.  Principal Problem: Aggression Diagnosis:  Principal Problem:   Aggression  Patient is seen, chart is reviewed.  Collateral obtained from [place] at patient's group home, and from patient's  POA, Nelle Don [phone]) Total Time spent with patient: 15 minutes  Past Psychiatric History: Schizoaffective disorder bipolar type  Past Medical History:  Past Medical History:  Diagnosis Date  . Bipolar 1 disorder (HCC)   . Diabetes mellitus without complication (HCC)   . Hyperlipidemia   . Hypertension   . 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 Schizophrenia, acute (HCC)   . Seizures (HCC)     Past Surgical History:  Procedure Laterality Date  . JOINT REPLACEMENT     Family History: No family history on file. Family Psychiatric  History: None noted Social History:  Social History   Substance and Sexual Activity  Alcohol Use No     Social History   Substance and Sexual Activity  Drug Use Yes  . Types: Marijuana   Comment: quit 5 years ago    Social History   Socioeconomic History  . Marital status: Single    Spouse name: Not on file  . Number of children: Not on file  . Years of education: Not on file  . Highest education level: Not on file  Occupational History  . Not on file  Social Needs  . Financial resource strain: Not on file  . Food insecurity:    Worry: Not on file    Inability: Not on file  . Transportation needs:    Medical: Not on file    Non-medical: Not on file  Tobacco Use  . Smoking status: Never Smoker  . Smokeless tobacco: Never Used  Substance and Sexual Activity  . Alcohol use: No  . Drug use: Yes  Types: Marijuana    Comment: quit 5 years ago  . Sexual activity: Not on file  Lifestyle  . Physical activity:    Days per week: Not on file    Minutes per session: Not on file  . Stress: Not on file  Relationships  . Social connections:    Talks on phone: Not on file    Gets together: Not on file    Attends religious service: Not on file    Active member of club or organization: Not on file    Attends meetings of clubs or organizations: Not on file    Relationship status: Not on file  Other  Topics Concern  . Not on file  Social History Narrative  . Not on file    Sleep: Good  Appetite:  Good  Current Medications: Current Facility-Administered Medications  Medication Dose Route Frequency Provider Last Rate Last Dose  . benztropine (COGENTIN) tablet 2 mg  2 mg Oral BID WC Mariel Craft, MD   2 mg at 03/05/19 5366  . cholecalciferol (VITAMIN D3) tablet 2,000 Units  2,000 Units Oral Daily Antonieta Pert, MD   2,000 Units at 03/04/19 1035  . clonazePAM (KLONOPIN) tablet 2 mg  2 mg Oral BID Sharyn Creamer, MD   2 mg at 03/04/19 2137  . divalproex (DEPAKOTE ER) 24 hr tablet 1,500 mg  1,500 mg Oral QHS Mariel Craft, MD   1,500 mg at 03/04/19 2138  . insulin aspart (novoLOG) injection 0-20 Units  0-20 Units Subcutaneous TID WC Nita Sickle, MD   4 Units at 03/03/19 1656  . insulin aspart (novoLOG) injection 0-5 Units  0-5 Units Subcutaneous QHS Don Perking, [place], MD      . insulin detemir (LEVEMIR) injection 28 Units  28 Units Subcutaneous QHS Antonieta Pert, MD   28 Units at 03/04/19 2137  . lactulose (CHRONULAC) 10 GM/15ML solution 10 g  10 g Oral BID Antonieta Pert, MD   10 g at 03/04/19 2139  . pantoprazole (PROTONIX) EC tablet 40 mg  40 mg Oral Daily Antonieta Pert, MD   40 mg at 03/04/19 1035  . sodium chloride tablet 1 g  1 g Oral Daily Antonieta Pert, MD   1 g at 03/04/19 1035  . ziprasidone (GEODON) capsule 40 mg  40 mg Oral BID WC Mariel Craft, MD   40 mg at 03/05/19 4403   Current Outpatient Medications  Medication Sig Dispense Refill  . benztropine (COGENTIN) 2 MG tablet Take 1 tablet (2 mg total) by mouth 2 (two) times daily. 60 tablet 0  . Cholecalciferol (VITAMIN D3) 50 MCG (2000 UT) capsule Take 2,000 Units by mouth daily.    . clonazePAM (KLONOPIN) 2 MG tablet Take 1 tablet (2 mg total) by mouth 2 (two) times daily. 60 tablet 0  . divalproex (DEPAKOTE) 250 MG DR tablet Take 3 tablets (750 mg total) by mouth every 12 (twelve)  hours. 180 tablet 0  . fluPHENAZine (PROLIXIN) 10 MG tablet Take 10 mg by mouth 3 (three) times daily.    . insulin detemir (LEVEMIR) 100 UNIT/ML injection Inject 0.28 mLs (28 Units total) into the skin at bedtime. 10 mL 11  . lactulose (CHRONULAC) 10 GM/15ML solution Take 15 mLs (10 g total) by mouth 2 (two) times daily. 600 mL 0  . levETIRAcetam (KEPPRA) 750 MG tablet Take 1,500 mg by mouth daily.    Marland Kitchen LORazepam (ATIVAN) 2 MG tablet Take 1 tablet (2 mg  total) by mouth every 6 (six) hours as needed for anxiety (agitation). 31 tablet 0  . mirtazapine (REMERON SOL-TAB) 15 MG disintegrating tablet Take 15 mg by mouth at bedtime.    Marland Kitchen OLANZapine (ZYPREXA) 5 MG tablet Take 5 mg by mouth every 8 (eight) hours as needed for agitation.    Marland Kitchen olanzapine zydis (ZYPREXA) 15 MG disintegrating tablet Take 15 mg by mouth at bedtime.    . pantoprazole (PROTONIX) 40 MG tablet Take 1 tablet (40 mg total) by mouth daily. 30 tablet 0  . perphenazine (TRILAFON) 8 MG tablet Take 8 mg by mouth 3 (three) times daily.    . QUEtiapine (SEROQUEL) 100 MG tablet Take 100 mg by mouth every 4 (four) hours as needed (anxiety).    . sodium chloride 1 g tablet Take 1 tablet (1 g total) by mouth daily. 30 tablet 0  . traZODone (DESYREL) 50 MG tablet Take 1 tablet (50 mg total) by mouth at bedtime. 30 tablet 0    Lab Results:  Results for orders placed or performed during the hospital encounter of 02/28/19 (from the past 48 hour(s))  Glucose, capillary     Status: None   Collection Time: 03/03/19 11:58 AM  Result Value Ref Range   Glucose-Capillary 80 70 - 99 mg/dL  Glucose, capillary     Status: Abnormal   Collection Time: 03/03/19  4:44 PM  Result Value Ref Range   Glucose-Capillary 153 (H) 70 - 99 mg/dL  Glucose, capillary     Status: Abnormal   Collection Time: 03/03/19  9:12 PM  Result Value Ref Range   Glucose-Capillary 154 (H) 70 - 99 mg/dL  Glucose, capillary     Status: None   Collection Time: 03/04/19  8:28 AM   Result Value Ref Range   Glucose-Capillary 90 70 - 99 mg/dL   Comment 1 Notify RN   Glucose, capillary     Status: None   Collection Time: 03/04/19 12:27 PM  Result Value Ref Range   Glucose-Capillary 92 70 - 99 mg/dL   Comment 1 Document in Chart   Glucose, capillary     Status: Abnormal   Collection Time: 03/04/19  5:30 PM  Result Value Ref Range   Glucose-Capillary 120 (H) 70 - 99 mg/dL  Glucose, capillary     Status: Abnormal   Collection Time: 03/04/19  8:31 PM  Result Value Ref Range   Glucose-Capillary 123 (H) 70 - 99 mg/dL  Glucose, capillary     Status: None   Collection Time: 03/05/19  7:47 AM  Result Value Ref Range   Glucose-Capillary 90 70 - 99 mg/dL    Blood Alcohol level:  Lab Results  Component Value Date   ETH <10 02/28/2019   ETH <10 02/24/2019    Physical Findings: AIMS:  , ,  ,  ,    CIWA:    COWS:     Musculoskeletal: Strength & Muscle Tone: within normal limits Gait & Station: normal Patient leans: N/A  Psychiatric Specialty Exam: Physical Exam  Nursing note and vitals reviewed. Constitutional: He appears well-developed and well-nourished.  HENT:  Head: Normocephalic and atraumatic.  Eyes: EOM are normal.  Neck: Normal range of motion.  Cardiovascular: Normal rate.  Respiratory: Effort normal. No respiratory distress.  Musculoskeletal: Normal range of motion.  Neurological: He is alert.  Skin:     Psychiatric: His mood appears not anxious. His affect is blunt. His affect is not labile and not inappropriate. His speech is tangential.  He is not agitated, not aggressive and not combative. Thought content is paranoid. Cognition and memory are normal. He does not express impulsivity or inappropriate judgment. He expresses no homicidal and no suicidal ideation. He expresses no homicidal plans. He is attentive.    Review of Systems  Neurological: Positive for sensory change (bilateral neuropathy of LE's).  Psychiatric/Behavioral: Negative for  depression, hallucinations, substance abuse and suicidal ideas. The patient is not nervous/anxious and does not have insomnia.   All other systems reviewed and are negative.   Blood pressure (!) 139/98, pulse (!) 108, temperature 98.3 F (36.8 C), resp. rate 16, SpO2 99 %.There is no height or weight on file to calculate BMI.  General Appearance: Casual  Eye Contact:  Good  Speech:  Clear and Coherent and Normal Rate  Volume:  Normal  Mood:  Euthymic  Affect:  Congruent, blunted  Thought Process:  Goal Directed and Descriptions of Associations: Circumstantial  Orientation:  Full (Time, Place, and Person)  Thought Content:  Hallucinations: None and Rumination  Suicidal Thoughts:  No  Homicidal Thoughts:  No  Memory:  Immediate;   Fair Recent;   Fair Remote;   Fair  Judgement:  Poor  Insight:  Shallow  Psychomotor Activity:  Normal  Concentration:  Concentration: Fair and Attention Span: Fair  Recall:  FiservFair  Fund of Knowledge:  Fair  Language:  Good  Akathisia:  Negative  Handed:  Right  AIMS (if indicated):     Assets:  Desire for Improvement Resilience  ADL's:  Intact  Cognition:  Impaired,  Mild  Sleep:   Adequate      Treatment Plan Summary: Daily contact with patient to assess and evaluate symptoms and progress in treatment, Medication management and Plan : Patient is seen and examined.  Patient is a 48 year old male with the above-stated past psychiatric history who is seen in follow-up in the emergency department.   His Depakote level on admission was 65.  Continue Depakote ER 1500 mg nightly for mood stabilization Repeat VPA level on 03/06/2019 Continue Klonopin 2 mg twice daily to decrease agitation Continue Cogentin 2 mg twice daily to prevent EPS Continue Geodon 40 mg twice daily with breakfast and dinner for mood stabilization and psychosis prevention.  Will monitor skin assessment for development of rash.  At this time, patient's behavior has been appropriate,  however he has not had available time for interaction within a milieu or group setting.  Given patient's history of intolerance of group home settings, it would be beneficial for patient to have an inpatient setting in which to assess and ensure patient can be successful in a group home.  Mariel CraftSHEILA M Kavontae Pritchard, MD 03/05/2019, 11:16 AM

## 2019-03-05 NOTE — BH Assessment (Signed)
Service Provider Request Status Phone Number  [facility] Pending - Request Sent [phone]  [facility] Pending - Request Sent [phone]  [facility] Pending - Request Sent [phone]  [facility] Pending - Request Sent [phone]  [facility] Pending - Request Sent [phone]  [facility] Pending - Request Sent [phone]  [facility] Pending - Request Sent [phone]  [facility] Pending - Request Sent [phone]  [facility] Pending - Request Sent [phone]  [facility] Pending - Request Sent [phone]  [facility] Pending - Request Sent [phone]  [facility] Pending - Request Sent [phone]  [facility] Pending - Request Sent [phone]  [facility] Pending - Request Sent [phone]  [facility] Pending - Request Sent [phone]  [facility] Pending - Request Sent [phone]  [facility] Pending - Request Sent [phone]  [facility] Pending - Request Sent [phone]  [facility] Pending - Request Sent [phone]  [facility] Pending - Request Sent [phone]  [facility] Pending - Request Sent [phone]  [facility] Pending - Request Sent [phone]  [facility] Declined   [phone]  [facility] Declined

## 2019-08-09 IMAGING — CR DG THORACIC SPINE 2V
5 series · 5 of 5 positions shown · non-contrast
Comparison: 08/07/2018

CLINICAL DATA: Back pain.  Fall yesterday.

EXAM:
THORACIC SPINE 2 VIEWS

[t-spine ap (1 of 2)]
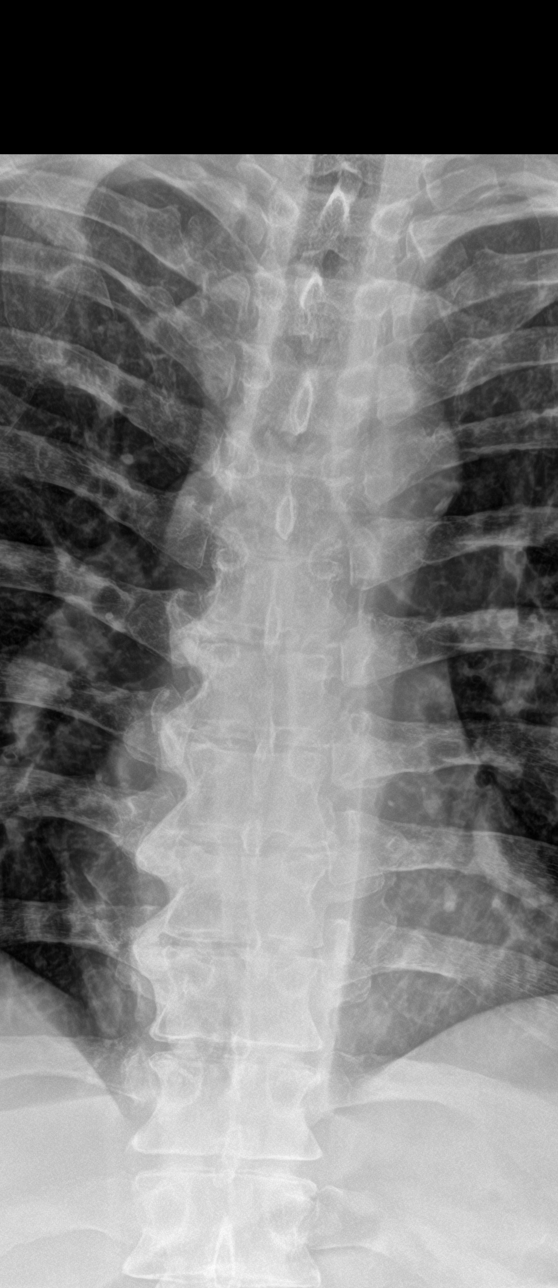

[t-spine lat (1 of 2)]
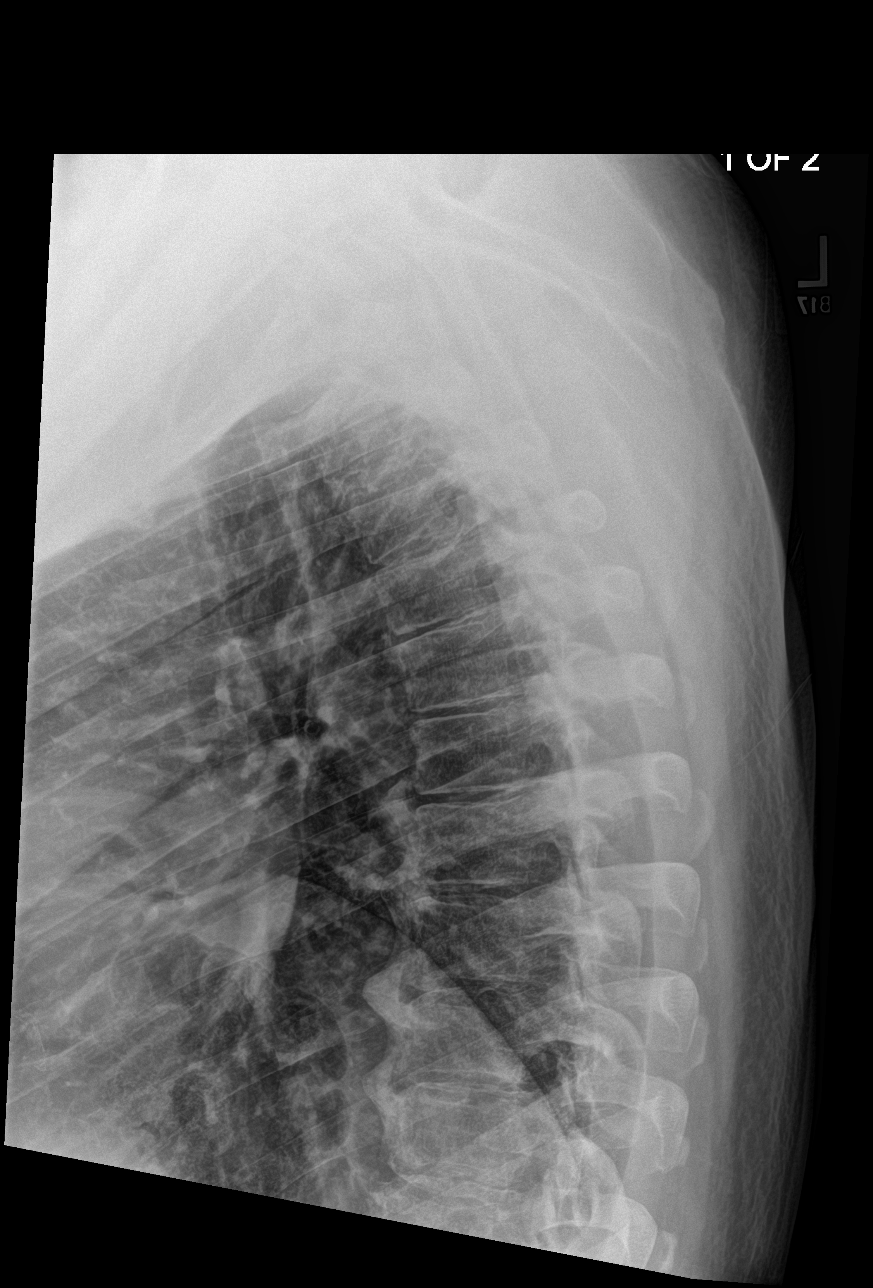

[t-spine swimmers]
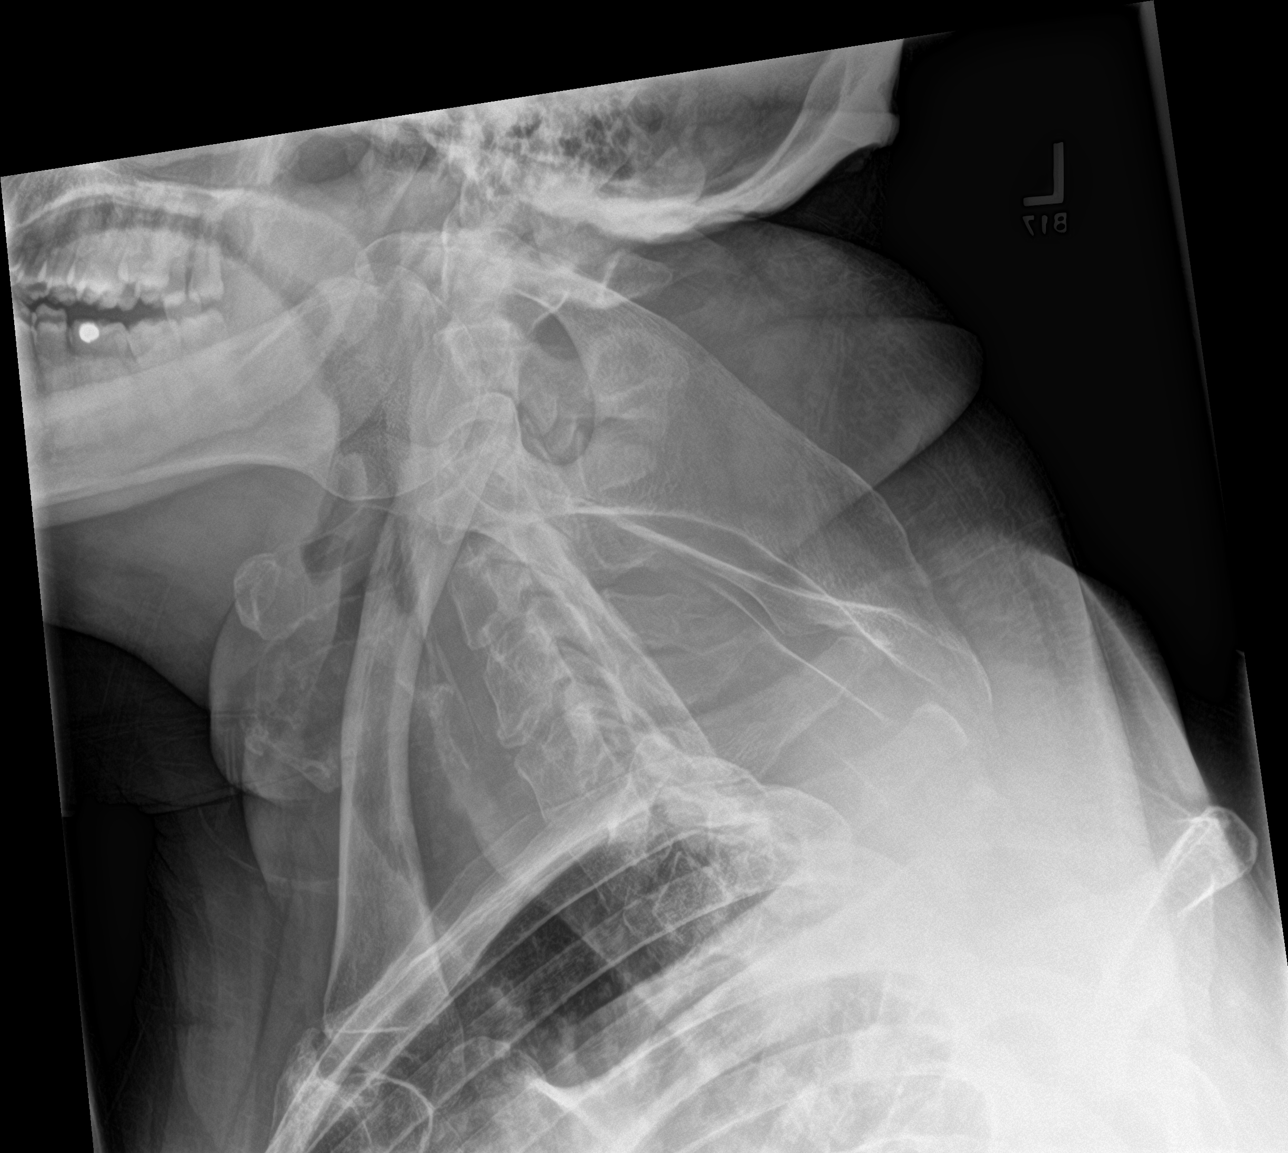

[t-spine ap (2 of 2)]
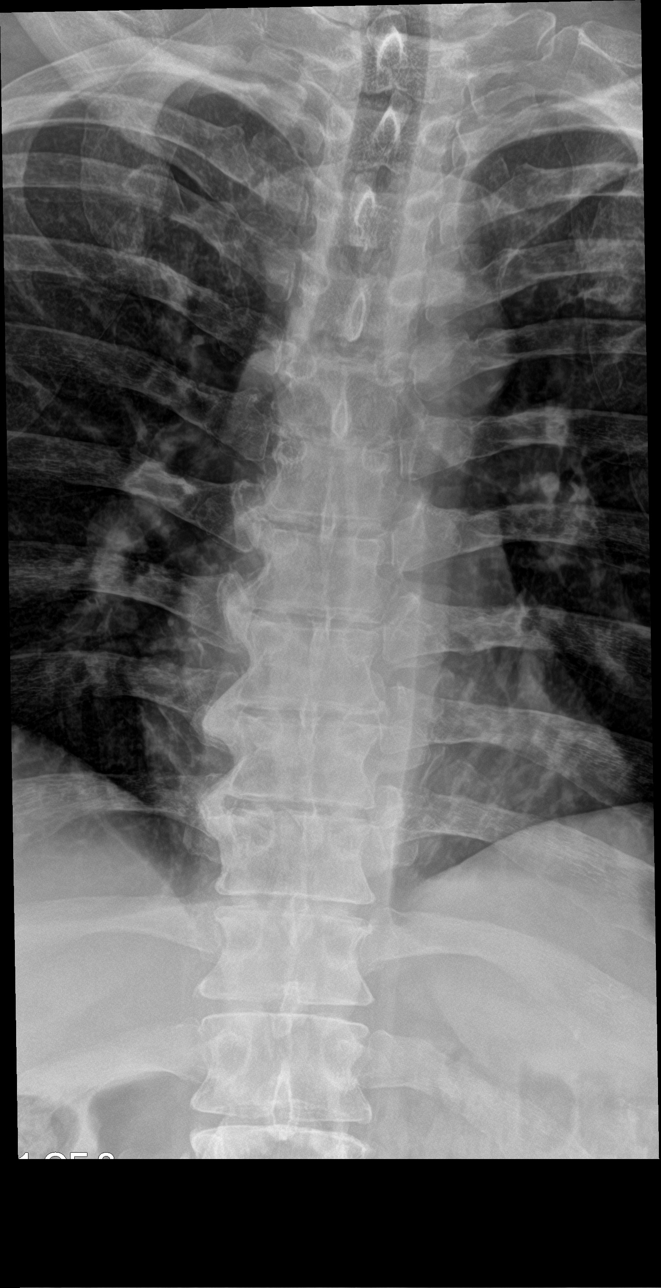

[t-spine lat (2 of 2)]
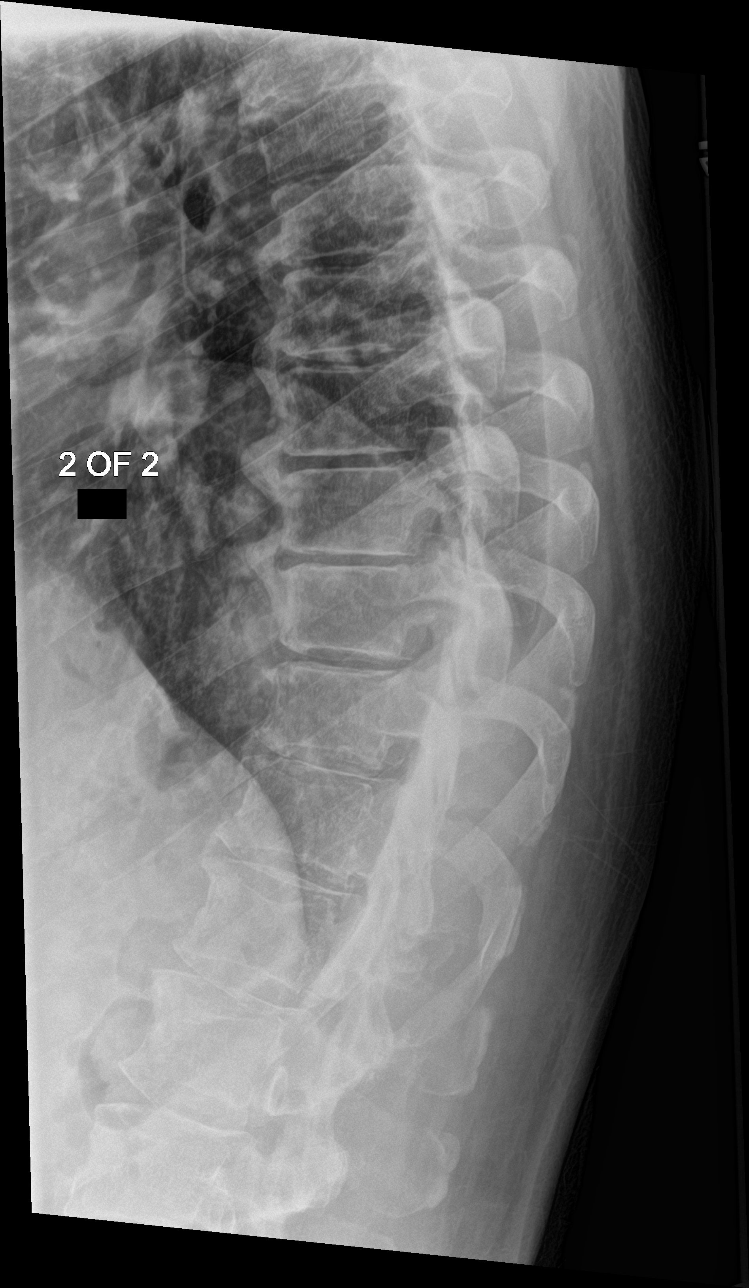

[5 of 5 positions shown; findings below may reference images not displayed]

FINDINGS: Negative for fracture or mass. Disc degeneration and right anterior
spurring in the mid lower thoracic spine appears chronic.
IMPRESSION: Negative for thoracic spine fracture

## 2019-08-09 IMAGING — CR DG LUMBAR SPINE COMPLETE 4+V
5 series · 5 of 5 positions shown · non-contrast
Comparison: 07/09/2018

CLINICAL DATA: Low back pain after fall twice yesterday.

EXAM:
LUMBAR SPINE - COMPLETE 4+ VIEW

[l-spine ap]
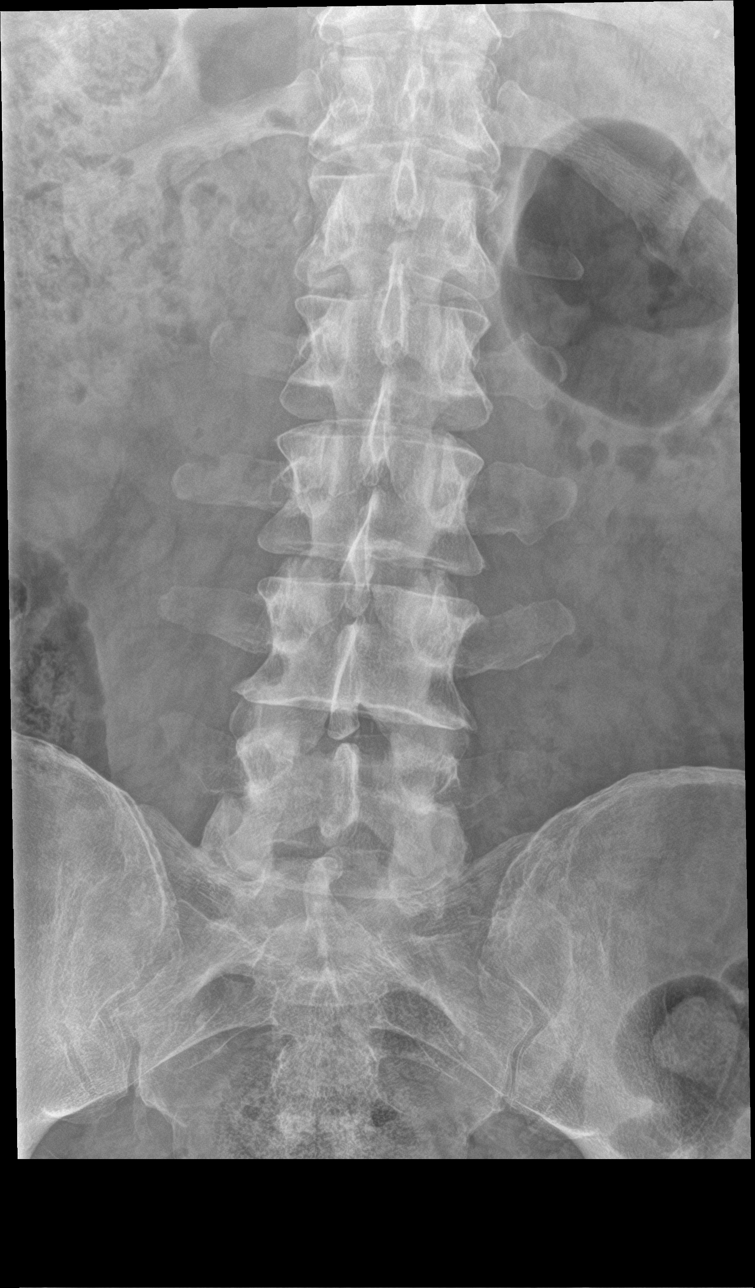

[l-spine obl (1 of 2)]
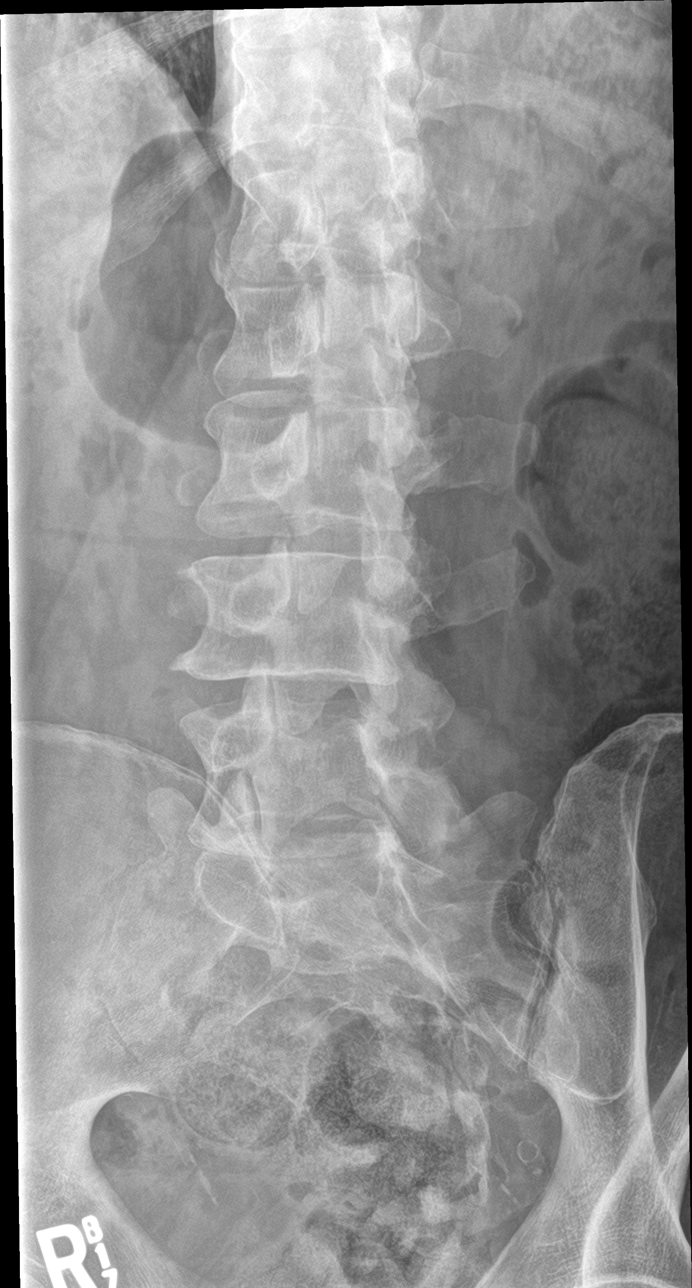

[l-spine obl (2 of 2)]
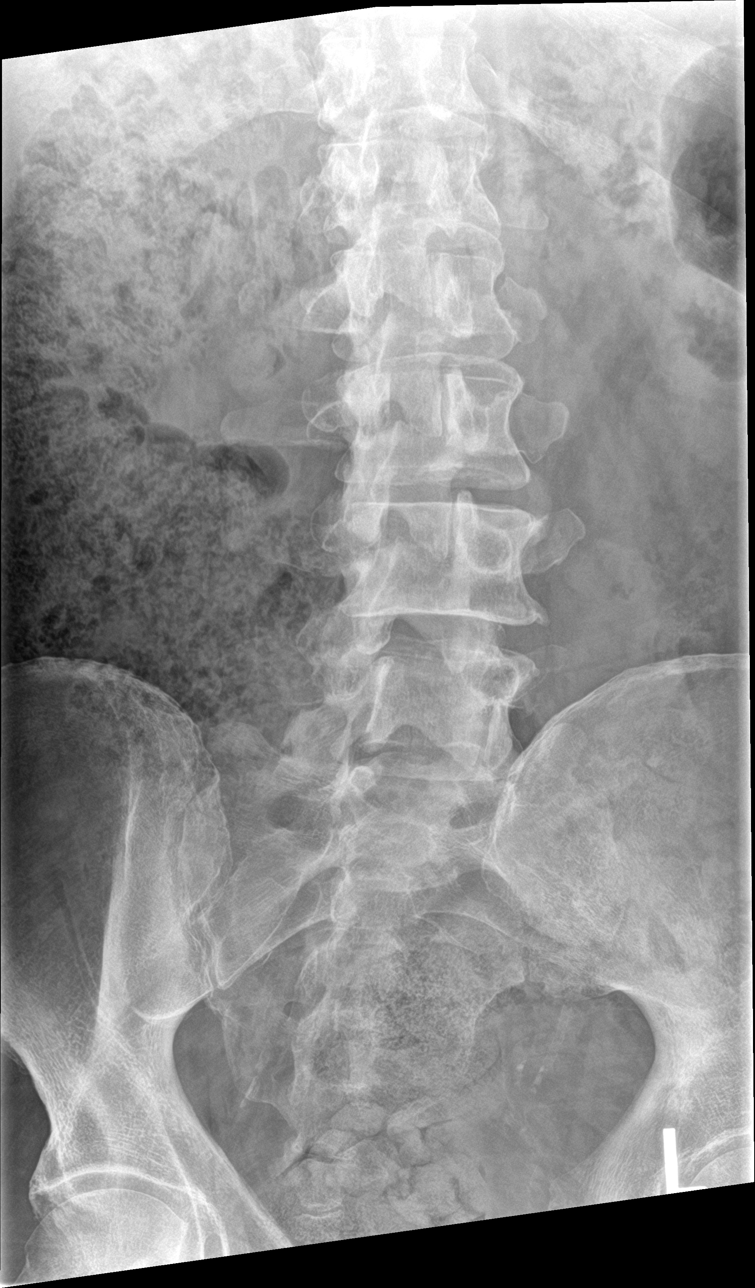

[l-spine lat]
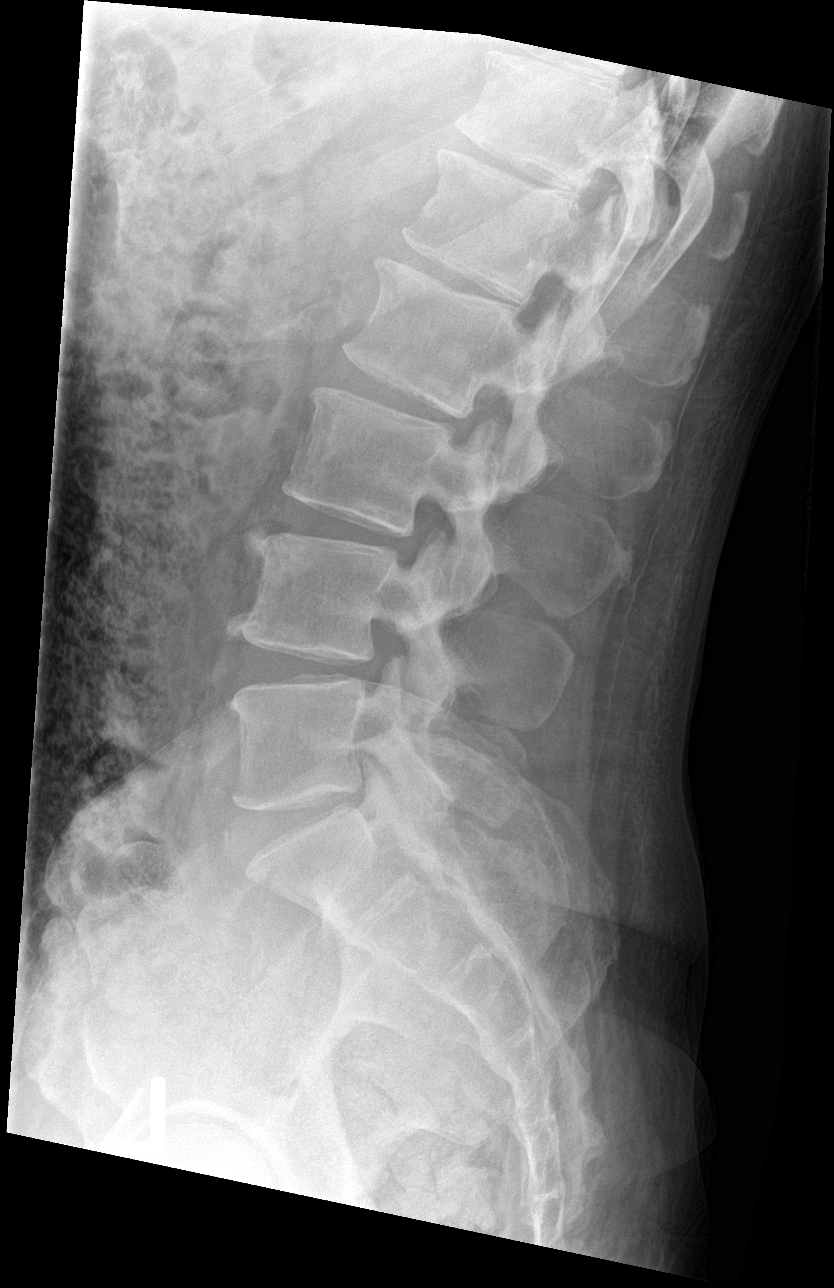

[l-spine spot]
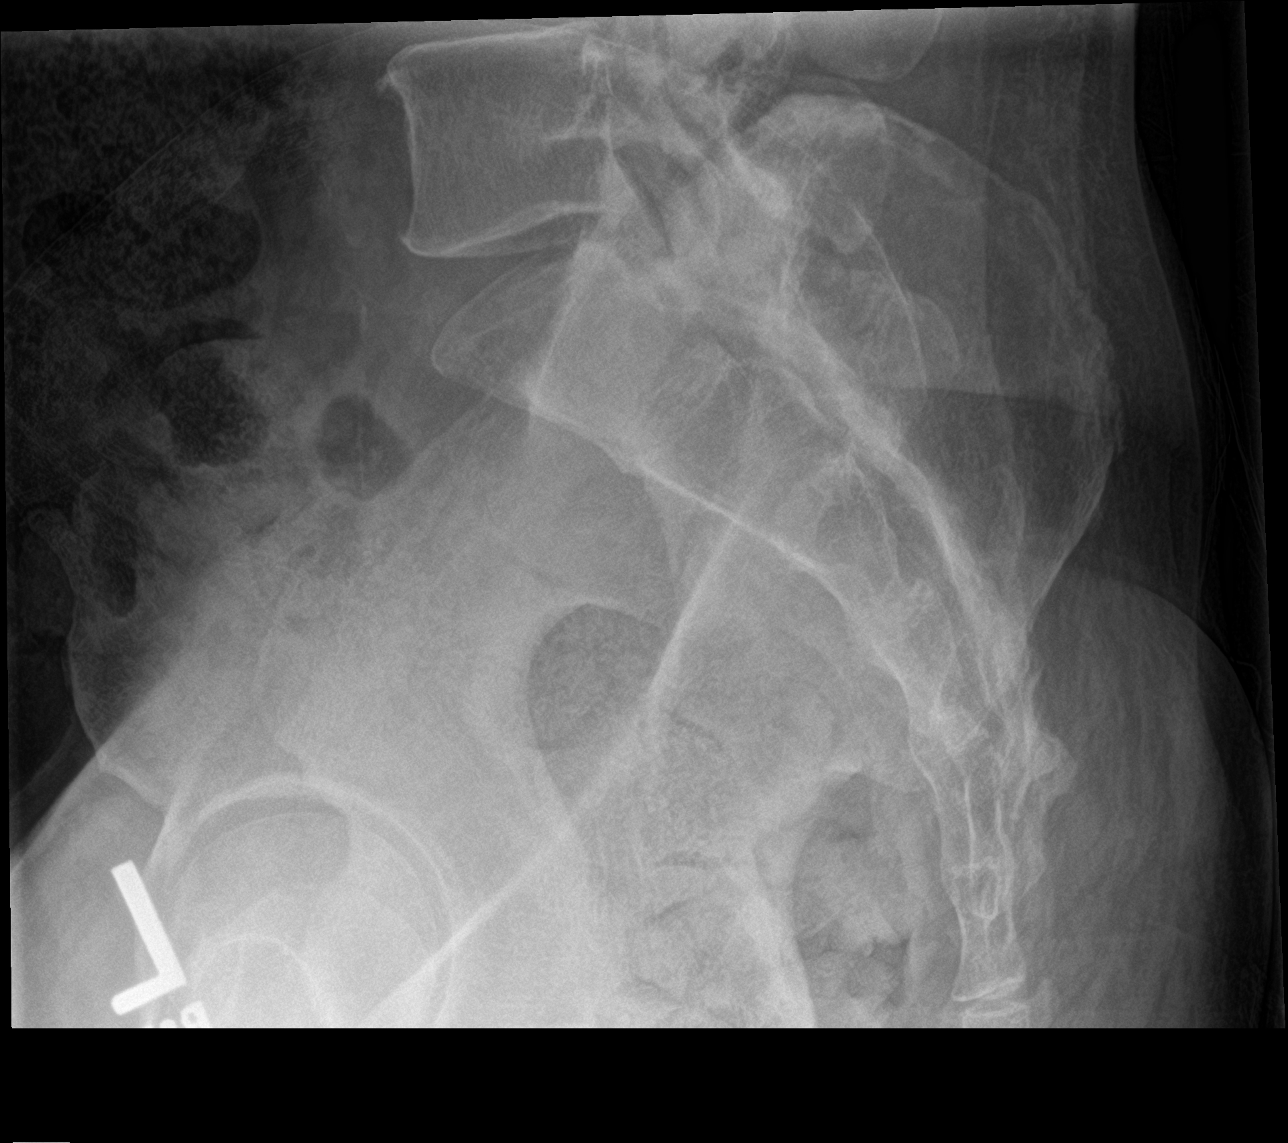

[5 of 5 positions shown; findings below may reference images not displayed]

FINDINGS: Vertebral body alignment and heights are normal. There is mild
spondylosis throughout the lumbar spine to include facet arthropathy
over the lower lumbar spine. No evidence of compression fracture or
spondylolisthesis. Disc spaces are unremarkable.
IMPRESSION: No acute findings.

Mild spondylosis of the lumbar spine.
